# Patient Record
Sex: Male | Born: 1950
Health system: Southern US, Community
[De-identification: ages and names within clinical notes are randomized; demographics above are authoritative.]

## PROBLEM LIST (undated history)

## (undated) DIAGNOSIS — J45909 Unspecified asthma, uncomplicated: Secondary | ICD-10-CM

## (undated) DIAGNOSIS — N200 Calculus of kidney: Secondary | ICD-10-CM

## (undated) DIAGNOSIS — I10 Essential (primary) hypertension: Secondary | ICD-10-CM

## (undated) DIAGNOSIS — N400 Enlarged prostate without lower urinary tract symptoms: Secondary | ICD-10-CM

## (undated) DIAGNOSIS — K219 Gastro-esophageal reflux disease without esophagitis: Secondary | ICD-10-CM

## (undated) DIAGNOSIS — B009 Herpesviral infection, unspecified: Secondary | ICD-10-CM

## (undated) DIAGNOSIS — M1990 Unspecified osteoarthritis, unspecified site: Secondary | ICD-10-CM

## (undated) DIAGNOSIS — E785 Hyperlipidemia, unspecified: Secondary | ICD-10-CM

## (undated) HISTORY — DX: Benign prostatic hyperplasia without lower urinary tract symptoms: N40.0

## (undated) HISTORY — DX: Herpesviral infection, unspecified: B00.9

## (undated) HISTORY — DX: Calculus of kidney: N20.0

## (undated) HISTORY — DX: Unspecified asthma, uncomplicated: J45.909

## (undated) HISTORY — PX: TONSILLECTOMY: SUR1361

## (undated) HISTORY — PX: APPENDECTOMY: SHX54

## (undated) HISTORY — PX: LITHOTRIPSY: SUR834

## (undated) HISTORY — DX: Essential (primary) hypertension: I10

## (undated) HISTORY — DX: Gastro-esophageal reflux disease without esophagitis: K21.9

## (undated) HISTORY — PX: BREAST CYST EXCISION: SHX579

## (undated) HISTORY — DX: Hyperlipidemia, unspecified: E78.5

## (undated) HISTORY — DX: Unspecified osteoarthritis, unspecified site: M19.90

---

## 2016-06-29 ENCOUNTER — Encounter: Payer: Self-pay | Admitting: Internal Medicine

## 2016-06-29 ENCOUNTER — Ambulatory Visit (INDEPENDENT_AMBULATORY_CARE_PROVIDER_SITE_OTHER): Payer: 59 | Admitting: Internal Medicine

## 2016-06-29 VITALS — BP 130/82 | HR 85 | Ht 69.0 in | Wt 197.8 lb

## 2016-06-29 DIAGNOSIS — J4530 Mild persistent asthma, uncomplicated: Secondary | ICD-10-CM | POA: Diagnosis not present

## 2016-06-29 DIAGNOSIS — R058 Other specified cough: Secondary | ICD-10-CM | POA: Insufficient documentation

## 2016-06-29 DIAGNOSIS — R05 Cough: Secondary | ICD-10-CM | POA: Diagnosis not present

## 2016-06-29 MED ORDER — PREDNISONE 10 MG PO TABS: ORAL_TABLET | ORAL | 0 refills | Status: DC

## 2016-06-29 MED ORDER — BUDESONIDE-FORMOTEROL FUMARATE 80-4.5 MCG/ACT IN AERO: 2.0000 | INHALATION_SPRAY | Freq: Two times a day (BID) | RESPIRATORY_TRACT | 12 refills | Status: DC

## 2016-06-29 MED ORDER — AMOXICILLIN-POT CLAVULANATE 875-125 MG PO TABS: 1.0000 | ORAL_TABLET | Freq: Two times a day (BID) | ORAL | 0 refills | Status: AC

## 2016-06-29 NOTE — Patient Instructions (Addendum)
Augmentin 875 mg take one pill twice daily  X 10 days - take at breakfast and supper with large glass of water.  It would help reduce the usual side effects (diarrhea and yeast infections) if you ate cultured yogurt at lunch.   Please see patient coordinator before you leave today  to schedule CT sinus  in 10 days - no sooner  Use nasal saline as much as possible to help moisturize your passages   Continue protonix but take it Take 30-60 min before first meal of the day   Prednisone 10 mg take  4 each am x 2 days,   2 each am x 2 days,  1 each am x 2 days and stop   Work on inhaler technique:  relax and gently blow all the way out then take a nice smooth deep breath back in, triggering the inhaler at same time you start breathing in.  Hold for up to 5 seconds if you can. Blow out thru nose. Rinse and gargle with water when done     Plan A = Automatic = symbicort 80 Take 2 puffs first thing in am and then another 2 puffs about 12 hours later.      Plan B = Backup Only use your albuterol as a rescue medication to be used if you can't catch your breath by resting or doing a relaxed purse lip breathing pattern.  - The less you use it, the better it will work when you need it. - Ok to use the inhaler up to 2 puffs  every 4 hours if you must but call for appointment if use goes up over your usual need - Don't leave home without it !!  (think of it like the spare tire for your car)   Please schedule a follow up office visit in 2 weeks, sooner if needed

## 2016-06-29 NOTE — Progress Notes (Signed)
Subjective:     Patient ID: Joshua [name], male   DOB: 03/21/1951,    MRN: [id number]  HPI   66 yo Joshua [name] with bad bronchitis around 1993 and quit smoking and then  all better until around 2000 while living in [place] with intermittent "wheezing" dx as asthma > never resolved  Self referred for dtc asthma   06/29/2016 1st [facility] office visit/ Yurika Pereda   Chief Complaint  Patient presents with  . Pulmonary Consult    moved from [place], chronic bronchitis, wheezing, dx with asthma in 2000, cold humidity makes it worse  maint on symb 160 2bid and at baseline intermittently noisy breathing s pattern though not waking him  noct   Worse since  Dec 26 th with uri/ sinus symptoms >  [facility] rx  Amox/only a little better, still severe nasal congestion/severe fits of day > noct cough and sub wheeze.  Typical triggers are uri/not seasonal pattern rhintiis - happens up to 4 x yearly with or without symbicort and just as severe.  No obvious day to day or daytime variability or assocmucus plugs or hemoptysis or cp or chest tightness,   or overt  hb symptoms. No unusual exp hx or h/o childhood pna/ asthma or knowledge of premature birth.  Sleeping ok without nocturnal  or early am exacerbation  of respiratory  c/o's or need for noct saba. Also denies any obvious fluctuation of symptoms with weather or environmental changes or other aggravating or alleviating factors except as outlined above   Current Medications, Allergies, Complete Past Medical History, Past Surgical History, Family History, and Social History were reviewed in [employer] record.  ROS  The following are not active complaints unless bolded sore throat, dysphagia, dental problems, itching, sneezing,  nasal congestion or excess/ purulent secretions, ear ache,   fever, chills, sweats, unintended wt loss, classically pleuritic or exertional cp,  orthopnea pnd or leg swelling, presyncope, palpitations, abdominal pain,  anorexia, nausea, vomiting, diarrhea  or change in bowel or bladder habits, change in stools or urine, dysuria,hematuria,  rash, arthralgias, visual complaints, headache, numbness, weakness or ataxia or problems with walking or coordination,  change in mood/affect or memory.             Review of Systems     Objective:   Physical Exam  amb wf   Wt Readings from Last 3 Encounters:  06/29/16 197 lb 12.8 oz (89.7 kg)    Vital signs reviewed - Note on arrival 02 sats  98% on RA     HEENT: nl dentition,   and oropharynx. Nl external ear canals without cough reflex  - MP secretions bilaterally with severe turbinate edema    NECK :  without JVD/Nodes/TM/ nl carotid upstrokes bilaterally   LUNGS: no acc muscle use,  Nl contour chest with prominent upper airway wheezing only    CV:  RRR  no s3 or murmur or increase in P2, nad no edema   ABD:  soft and nontender with nl inspiratory excursion in the supine position. No bruits or organomegaly appreciated, bowel sounds nl  MS:  Nl gait/ ext warm without deformities, calf tenderness, cyanosis or clubbing No obvious joint restrictions   SKIN: warm and dry without lesions    NEURO:  alert, approp, nl sensorium with  no motor or cerebellar deficits apparent.   cxr ordered 06/29/2016 not done     Assessment:

## 2016-06-30 DIAGNOSIS — J4530 Mild persistent asthma, uncomplicated: Secondary | ICD-10-CM | POA: Insufficient documentation

## 2016-06-30 NOTE — Assessment & Plan Note (Signed)
06/29/2016  After extensive coaching HFA effectiveness =    75% from baseline 10% > reduce symbicort to 80 2 bid   Not really clear how much asthma is present since he says he's having a typical flare now and only getting 10% of his hfa to the target with no change in the pattern of his symptoms before or after maint rx with symbicort so for now rec reduce to symb 80 2bid then do full pfts before stopping it altogether if possible

## 2016-06-30 NOTE — Assessment & Plan Note (Signed)
Upper airway cough syndrome (previously labeled PNDS) , is  so named because it's frequently impossible to sort out how much is  CR/sinusitis with freq throat clearing (which can be related to primary GERD)   vs  causing  secondary (" extra esophageal")  GERD from wide swings in gastric pressure that occur with throat clearing, often  promoting self use of mint and menthol lozenges that reduce the lower esophageal sphincter tone and exacerbate the problem further in a cyclical fashion.   These are the same pts (now being labeled as having "irritable larynx syndrome" by some cough centers) who not infrequently have a history of having failed to tolerate ace inhibitors,  dry powder inhalers or biphosphonates or report having atypical/extraesophageal reflux symptoms that don't respond to standard doses of PPI  and are easily confused as having aecopd or asthma flares by even experienced allergists/ pulmonologists (myself included).   I don't really think he has much asthma at this point and to test this theory rec reduce symb to 80 2bid (see separate a/p) and focus on rx of upper airway sources:  Treat sinusitis and possible gerd aggressively then regroup here in 2 weeks with sinus ct p rx to see if ent eval needed  Total time devoted to counseling  > 50 % of 60 min office visit:  review case with pt/ discussion of options/alternatives/ personally creating written customized instructions  in presence of pt  then going over those specific  Instructions directly with the pt including how to use all of the meds but in particular covering each new medication in detail and the difference between the maintenance/automatic meds and the prns using an action plan format for the latter.  Please see AVS from this visit for a full list of these instructions which I personally wrote for him and  are unique to this visit.

## 2016-07-09 ENCOUNTER — Ambulatory Visit (HOSPITAL_BASED_OUTPATIENT_CLINIC_OR_DEPARTMENT_OTHER)
Admission: RE | Admit: 2016-07-09 | Discharge: 2016-07-09 | Disposition: A | Discharge status: Home / Self Care | Payer: BLUE CROSS/BLUE SHIELD | Source: Ambulatory Visit | Attending: Internal Medicine | Admitting: Internal Medicine

## 2016-07-09 DIAGNOSIS — R05 Cough: Secondary | ICD-10-CM | POA: Diagnosis not present

## 2016-07-09 DIAGNOSIS — R058 Other specified cough: Secondary | ICD-10-CM

## 2016-07-09 NOTE — Progress Notes (Signed)
LMTCB

## 2016-07-12 NOTE — Progress Notes (Signed)
Called and left detailed msg on spouses phone (ok per DPR)

## 2016-07-21 ENCOUNTER — Ambulatory Visit (INDEPENDENT_AMBULATORY_CARE_PROVIDER_SITE_OTHER): Payer: 59 | Admitting: Internal Medicine

## 2016-07-21 ENCOUNTER — Encounter: Payer: Self-pay | Admitting: Internal Medicine

## 2016-07-21 ENCOUNTER — Ambulatory Visit (INDEPENDENT_AMBULATORY_CARE_PROVIDER_SITE_OTHER)
Admission: RE | Admit: 2016-07-21 | Discharge: 2016-07-21 | Disposition: A | Discharge status: Home / Self Care | Payer: 59 | Source: Ambulatory Visit | Attending: Internal Medicine | Admitting: Internal Medicine

## 2016-07-21 VITALS — BP 120/80 | HR 86 | Ht 69.0 in | Wt 196.0 lb

## 2016-07-21 DIAGNOSIS — R05 Cough: Secondary | ICD-10-CM

## 2016-07-21 DIAGNOSIS — R058 Other specified cough: Secondary | ICD-10-CM

## 2016-07-21 DIAGNOSIS — J4530 Mild persistent asthma, uncomplicated: Secondary | ICD-10-CM

## 2016-07-21 LAB — NITRIC OXIDE: NITRIC OXIDE: 24

## 2016-07-21 NOTE — Assessment & Plan Note (Signed)
06/29/2016  After extensive coaching HFA effectiveness =    75% from baseline 10% > reduce symbicort to 80 2 bid  - FENO 07/21/2016  =   24 on symb 80 2bid - Spirometry 07/21/2016  wnl including  Mid flows p am symbicort > try off   He is better on lower dose of symbicort so this is either not asthma or he has well controlled asthma with unrelated non-specific UACS which was worse with higher dose of ICS   rec try off symbicort completely while maint rx for gerd and focusing on rx of chronic rhinitis/ sinusitis  I had an extended discussion with the patient and wife  reviewing all relevant studies completed to date and  lasting 15 to 20 minutes of a 25 minute visit    Each maintenance medication was reviewed in detail including most importantly the difference between maintenance and prns and under what circumstances the prns are to be triggered using an action plan format that is not reflected in the computer generated alphabetically organized AVS.    Please see AVS for specific instructions unique to this visit that I personally wrote and verbalized to the the pt in detail and then reviewed with pt  by my nurse highlighting any  changes in therapy recommended at today's visit to their plan of care.

## 2016-07-21 NOTE — Progress Notes (Signed)
Subjective:     Patient ID: Joshua [name], male   DOB: October 12, 1950,    MRN: [id number]    Brief patient profile:  66 yo [name] with bad bronchitis around 1993 and quit smoking and then  all better until around 2000 while living in [place] with intermittent "wheezing" dx as asthma > never resolved  Self referred for dtc asthma   History of Present Illness  06/29/2016 1st [facility] office visit/ Joshua [name]   Chief Complaint  Patient presents with  . Pulmonary Consult    moved from [place], chronic bronchitis, wheezing, dx with asthma in 2000, cold humidity makes it worse  maint on symb 160 2bid and at baseline intermittently noisy breathing one breath q 2-3 days  s pattern though not waking him  noct   Worse since  Jun 22 2016  with uri/ sinus symptoms >  [facility] rx  Amox/only a little better, still severe nasal congestion/severe fits of day > noct cough and subj wheeze.  Typical triggers are uri/not seasonal pattern rhintiis - happens up to 4 x yearly with or without symbicort and just as severe. rec Augmentin 875 mg take one pill twice daily  X 10 days   Please see patient coordinator before you leave today  to schedule CT sinus  in 10 days - no sooner Use nasal saline as much as possible to help moisturize your passages  Continue protonix but take it Take 30-60 min before first meal of the day  Prednisone 10 mg take  4 each am x 2 days,   2 each am x 2 days,  1 each am x 2 days and stop  Work on inhaler technique:  Plan A = Automatic = symbicort 80 Take 2 puffs first thing in am and then another 2 puffs about 12 hours later.  Plan B = Backup Only use your albuterol (Proir) as a rescue medication to be used if you can't catch your breath by resting or doing a relaxed purse lip breathing pattern.  - The less you use it, the better it will work when you need it. - Ok to use the inhaler up to 2 puffs  every 4 hours     07/21/2016  f/u ov/Joshua [name] re: uacs vs asthma Chief Complaint  Patient presents  with  . Follow-up    Cough is much improved, but has not completely resolved. Breathing has improved.   cough is worse at bedtime and in am p rising but not waking up prematurely and not productive  Bloody nasal d/c / no sob at all   No obvious day to day or daytime variability or assoc mucus plugs or hemoptysis or cp or chest tightness,   or overt  hb symptoms. No unusual exp hx or h/o childhood pna/ asthma or knowledge of premature birth.  Sleeping ok without nocturnal  or early am exacerbation  of respiratory  c/o's or need for noct saba. Also denies any obvious fluctuation of symptoms with weather or environmental changes or other aggravating or alleviating factors except as outlined above   Current Medications, Allergies, Complete Past Medical History, Past Surgical History, Family History, and Social History were reviewed in [employer] record.  ROS  The following are not active complaints unless bolded sore throat, dysphagia, dental problems, itching, sneezing,  nasal congestion   ear ache,   fever, chills, sweats, unintended wt loss, classically pleuritic or exertional cp,  orthopnea pnd or leg swelling, presyncope, palpitations, abdominal pain,  anorexia, nausea, vomiting, diarrhea  or change in bowel or bladder habits, change in stools or urine, dysuria,hematuria,  rash, arthralgias, visual complaints, headache, numbness, weakness or ataxia or problems with walking or coordination,  change in mood/affect or memory.           Objective:   Physical Exam  amb wm very nasal sounding voince     Wt Readings from Last 3 Encounters:  07/21/16 196 lb (88.9 kg)  06/29/16 197 lb 12.8 oz (89.7 kg)    Vital signs reviewed   - Note on arrival 02 sats  96% on RA    HEENT: nl dentition,   and oropharynx. Nl external ear canals without cough reflex  - crusty secretions bilaterally with severe turbinate edema R > L     NECK :  without JVD/Nodes/TM/ nl carotid upstrokes  bilaterally   LUNGS: no acc muscle use,  Nl contour chest  - clear to A and P bilaterally without cough on inspiration or exp maneuvers    CV:  RRR  no s3 or murmur or increase in P2, nad no edema   ABD:  soft and nontender with nl inspiratory excursion in the supine position. No bruits or organomegaly appreciated, bowel sounds nl  MS:  Nl gait/ ext warm without deformities, calf tenderness, cyanosis or clubbing No obvious joint restrictions   SKIN: warm and dry without lesions    NEURO:  alert, approp, nl sensorium with  no motor or cerebellar deficits apparent.      CXR PA and Lateral:   07/21/2016 :    I personally reviewed images and agree with radiology impression as follows:    no acute cardiopulmonary findings      Assessment:

## 2016-07-21 NOTE — Patient Instructions (Signed)
Use lots of saline nasal spray on your nose  Please remember to go to the x-ray department downstairs for your tests - we will call you with the results when they are available.   Try off the symbicort to see if any worse and if so restart  At 2 pffs every 12 hour  We will call to schedule ENT    Please schedule a follow up office visit in 6 weeks, call sooner if needed

## 2016-07-21 NOTE — Assessment & Plan Note (Addendum)
06/29/2016  After extensive coaching HFA effectiveness =    75% try lower strength of symb - sinus CT p 10 day augmentin  06/29/2016 >>>  CT  Sinus 07/09/2016 mild chronic changes only  - cough better 07/21/2016 > try just on gerd rx/ no more symbicort   Reports neg allergy w/u for same problem in [place] last year so unlikely to be fruitful now but all his problems at this point relate to poorly controlled rhinitis so plan to refer to ENT next

## 2016-07-22 ENCOUNTER — Other Ambulatory Visit: Payer: Self-pay | Admitting: Internal Medicine

## 2016-07-22 DIAGNOSIS — J328 Other chronic sinusitis: Secondary | ICD-10-CM

## 2016-07-22 DIAGNOSIS — J329 Chronic sinusitis, unspecified: Secondary | ICD-10-CM | POA: Insufficient documentation

## 2016-07-22 NOTE — Progress Notes (Signed)
Spoke with pt and notified of results per Dr. Wert. Pt verbalized understanding and denied any questions. 

## 2016-08-04 ENCOUNTER — Other Ambulatory Visit: Payer: Self-pay | Admitting: Internal Medicine

## 2016-08-04 MED ORDER — BUDESONIDE-FORMOTEROL FUMARATE 80-4.5 MCG/ACT IN AERO: 2.0000 | INHALATION_SPRAY | Freq: Two times a day (BID) | RESPIRATORY_TRACT | 11 refills | Status: DC

## 2016-08-04 MED ORDER — PANTOPRAZOLE SODIUM 40 MG PO TBEC: 40.0000 mg | DELAYED_RELEASE_TABLET | Freq: Every day | ORAL | 11 refills | Status: DC

## 2016-08-04 NOTE — Telephone Encounter (Signed)
Pt returning call.Joshua [name] ° °

## 2016-08-04 NOTE — Telephone Encounter (Signed)
lmtcb to verify pharmacy, before ordering Rx.

## 2016-08-04 NOTE — Telephone Encounter (Signed)
Rxs have been sent to [facility] in [place]. I have left a message with the pt making him aware of this and let us know if this was the incorrect pharmacy. Nothing further was needed.

## 2016-08-18 ENCOUNTER — Ambulatory Visit: Payer: 59 | Admitting: Family Medicine

## 2016-09-03 ENCOUNTER — Ambulatory Visit: Payer: 59 | Admitting: Internal Medicine

## 2016-09-23 ENCOUNTER — Encounter: Payer: Self-pay | Admitting: Internal Medicine

## 2016-09-23 ENCOUNTER — Ambulatory Visit (INDEPENDENT_AMBULATORY_CARE_PROVIDER_SITE_OTHER): Payer: 59 | Admitting: Internal Medicine

## 2016-09-23 VITALS — BP 148/90 | HR 80 | Ht 69.0 in | Wt 196.4 lb

## 2016-09-23 DIAGNOSIS — J328 Other chronic sinusitis: Secondary | ICD-10-CM

## 2016-09-23 DIAGNOSIS — R05 Cough: Secondary | ICD-10-CM | POA: Diagnosis not present

## 2016-09-23 DIAGNOSIS — R058 Other specified cough: Secondary | ICD-10-CM

## 2016-09-23 DIAGNOSIS — J4530 Mild persistent asthma, uncomplicated: Secondary | ICD-10-CM

## 2016-09-23 NOTE — Patient Instructions (Signed)
Plan A = Automatic = symbicort 80 Take 2 puffs first thing in am and then another 2 puffs about 12 hours later.   Work on inhaler technique:  relax and gently blow all the way out then take a nice smooth deep breath back in, triggering the inhaler at same time you start breathing in.  Hold for up to 5 seconds if you can. Blow out thru nose. Rinse and gargle with water when done      Plan B = Backup Only use your albuterol as a rescue medication to be used if you can't catch your breath by resting or doing a relaxed purse lip breathing pattern.  - The less you use it, the better it will work when you need it. - Ok to use the inhaler up to 2 puffs  every 4 hours if you must but call for appointment if use goes up over your usual need - Don't leave home without it !!  (think of it like the spare tire for your car)     If you are satisfied with your treatment plan,  let your doctor know and he/she can either refill your medications or you can return here when your prescription runs out.     If in any way you are not 100% satisfied,  please tell us.  If 100% better, tell your friends!  Pulmonary follow up is as needed

## 2016-09-23 NOTE — Progress Notes (Signed)
Subjective:     Patient ID: Joshua [name], male   DOB: September 29, 1950,    MRN: [id number]    Brief patient profile:  66 yo Joshua [name] with bad bronchitis around 1993 and quit smoking and then  all better until around 2000 while living in [place] with intermittent "wheezing" dx as asthma > never resolved  Self referred for dtc asthma   History of Present Illness  06/29/2016 1st Joshua [name] office visit/ Joshua [name]   Chief Complaint  Patient presents with  . [name] Consult    moved from [place], chronic bronchitis, wheezing, dx with asthma in 2000, cold humidity makes it worse  maint on symb 160 2bid and at baseline intermittently noisy breathing one breath q 2-3 days  s pattern though not waking him  noct   Worse since  Jun 22 2016  with uri/ sinus symptoms >  [facility] rx  Amox/only a little better, still severe nasal congestion/severe fits of day > noct cough and subj wheeze.  Typical triggers are uri/not seasonal pattern rhintiis - happens up to 4 x yearly with or without symbicort and just as severe. rec Augmentin 875 mg take one pill twice daily  X 10 days   Please see patient coordinator before you leave today  to schedule CT sinus  in 10 days - no sooner Use nasal saline as much as possible to help moisturize your passages  Continue protonix but take it Take 30-60 min before first meal of the day  Prednisone 10 mg take  4 each am x 2 days,   2 each am x 2 days,  1 each am x 2 days and stop  Work on inhaler technique:  Plan A = Automatic = symbicort 80 Take 2 puffs first thing in am and then another 2 puffs about 12 hours later.  Plan B = Backup Only use your albuterol (Proir) as a rescue medication to be used if you can't catch your breath by resting or doing a relaxed purse lip breathing pattern.  - The less you use it, the better it will work when you need it. - Ok to use the inhaler up to 2 puffs  every 4 hours     07/21/2016  f/u ov/Joshua [name] re: uacs vs asthma Chief Complaint  Patient presents  with  . Follow-up    Cough is much improved, but has not completely resolved. Breathing has improved.   cough is worse at bedtime and in am p rising but not waking up prematurely and not productive  Bloody nasal d/c / no sob at all  rec Use lots of saline nasal spray on your nose Please remember to go to the x-ray department downstairs for your tests - we will call you with the results when they are available. Ty off the symbicort to see if any worse and if so restart  At 2 pffs every 12 hour schedule ENT  >   [name] rec abx and f/u prn    09/23/2016  f/u ov/Joshua [name] re: uacs vs asthma, was worse off symbicort so restarted and improved  Chief Complaint  Patient presents with  . Follow-up    Cough has completely resovled. He states he is using his albuterol inhaler 1 x every 2 wks on average.    Not limited by breathing from desired activities    No obvious day to day or daytime variability or assoc excess/ purulent sputum or mucus plugs or hemoptysis or cp or chest tightness, subjective wheeze or  overt sinus or hb symptoms. No unusual exp hx or h/o childhood pna/ asthma or knowledge of premature birth.  Sleeping ok without nocturnal  or early am exacerbation  of respiratory  c/o's or need for noct saba. Also denies any obvious fluctuation of symptoms with weather or environmental changes or other aggravating or alleviating factors except as outlined above   Current Medications, Allergies, Complete Past Medical History, Past Surgical History, Family History, and Social History were reviewed in [employer] record.  ROS  The following are not active complaints unless bolded sore throat, dysphagia, dental problems, itching, sneezing,  nasal congestion or excess/ purulent secretions, ear ache,   fever, chills, sweats, unintended wt loss, classically pleuritic or exertional cp,  orthopnea pnd or leg swelling, presyncope, palpitations, abdominal pain, anorexia, nausea,  vomiting, diarrhea  or change in bowel or bladder habits, change in stools or urine, dysuria,hematuria,  rash, arthralgias, visual complaints, headache, numbness, weakness or ataxia or problems with walking or coordination,  change in mood/affect or memory.           Objective:   Physical Exam  amb wm nad     Wt Readings from Last 3 Encounters:  07/21/16 196 lb (88.9 kg)  06/29/16 197 lb 12.8 oz (89.7 kg)    Vital signs reviewed     HEENT: nl dentition,   and oropharynx. Nl external ear canals without cough reflex  - moderate bilateral non-specific turbinate edema    NECK :  without JVD/Nodes/TM/ nl carotid upstrokes bilaterally   LUNGS: no acc muscle use,  Nl contour chest  - clear to A and P bilaterally without cough on inspiration or exp maneuvers    CV:  RRR  no s3 or murmur or increase in P2, nad no edema   ABD:  soft and nontender with nl inspiratory excursion in the supine position. No bruits or organomegaly appreciated, bowel sounds nl  MS:  Nl gait/ ext warm without deformities, calf tenderness, cyanosis or clubbing No obvious joint restrictions   SKIN: warm and dry without lesions    NEURO:  alert, approp, nl sensorium with  no motor or cerebellar deficits apparent.      CXR PA and Lateral:   07/21/2016 :    I personally reviewed images and agree with radiology impression as follows:    no acute cardiopulmonary findings      Assessment:       Outpatient Encounter Prescriptions as of 09/23/2016  Medication Sig  . Albuterol Sulfate 108 (90 Base) MCG/ACT AEPB Inhale into the lungs.  Marland Kitchen alfuzosin (UROXATRAL) 10 MG 24 hr tablet Take 1 tablet by mouth daily.  . budesonide-formoterol (SYMBICORT) 80-4.5 MCG/ACT inhaler Inhale 2 puffs into the lungs 2 (two) times daily.  Marland Kitchen losartan (COZAAR) 100 MG tablet Take 100 mg by mouth daily.  . pantoprazole (PROTONIX) 40 MG tablet Take 1 tablet (40 mg total) by mouth daily.   No facility-administered encounter medications on  file as of 09/23/2016.

## 2016-09-24 NOTE — Assessment & Plan Note (Signed)
06/29/2016  After extensive coaching HFA effectiveness =    75% try lower strength of symb - sinus CT p 10 day augmentin  06/29/2016 >>>  CT  Sinus 07/09/2016 mild chronic changes only  - cough better 07/21/2016 > try just on gerd rx/ no more symbicort > flared off symbicort supporting asthma   For now would like him continue on gerd rx x 3 months total and then try off and see if cough flares in which case GI eval needed

## 2016-09-24 NOTE — Assessment & Plan Note (Signed)
eval by [facility] ent 07/26/16  Nordbladh, PA > rec levaquin  500 x  10 days and f/u prn

## 2016-09-24 NOTE — Assessment & Plan Note (Signed)
06/29/2016    reduce symbicort to 80 2 bid  - FENO 07/21/2016  =   24 on symb 80 2bid - Spirometry 07/21/2016  wnl including  Mid flows p am symbicort > try off > flared so restarted  - 09/23/2016  After extensive coaching HFA effectiveness =    90%   Flare off symbicort 80 resolved back on it is good evidence of asthma/ cough variant and now it appears All goals of chronic asthma control met including optimal function and elimination of symptoms with minimal need for rescue therapy.  I had an extended final summary discussion with the patient reviewing all relevant studies completed to date and  lasting 15 to 20 minutes of a 25 minute visit on the following issues:   Contingencies discussed in full including contacting this office immediately if not controlling the symptoms using the rule of two's.     Each maintenance medication was reviewed in detail including most importantly the difference between maintenance and as needed and under what circumstances the prns are to be used.  Please see AVS for specific  Instructions which are unique to this visit and I personally typed out  which were reviewed in detail in writing with the patient and a copy provided.    f/u can be per primary care > f/u here prn

## 2016-09-30 ENCOUNTER — Encounter: Payer: Self-pay | Admitting: Family Medicine

## 2016-09-30 ENCOUNTER — Ambulatory Visit (INDEPENDENT_AMBULATORY_CARE_PROVIDER_SITE_OTHER): Payer: 59 | Admitting: Family Medicine

## 2016-09-30 VITALS — BP 150/88 | HR 75 | Temp 98.2°F | Ht 69.0 in | Wt 197.0 lb

## 2016-09-30 DIAGNOSIS — Z13220 Encounter for screening for lipoid disorders: Secondary | ICD-10-CM

## 2016-09-30 DIAGNOSIS — Z131 Encounter for screening for diabetes mellitus: Secondary | ICD-10-CM

## 2016-09-30 DIAGNOSIS — I10 Essential (primary) hypertension: Secondary | ICD-10-CM | POA: Diagnosis not present

## 2016-09-30 DIAGNOSIS — Z130 Encounter for screening for diseases of the blood and blood-forming organs and certain disorders involving the immune mechanism: Secondary | ICD-10-CM | POA: Diagnosis not present

## 2016-09-30 DIAGNOSIS — K219 Gastro-esophageal reflux disease without esophagitis: Secondary | ICD-10-CM | POA: Diagnosis not present

## 2016-09-30 MED ORDER — AMLODIPINE BESYLATE 5 MG PO TABS: 5.0000 mg | ORAL_TABLET | Freq: Every day | ORAL | 3 refills | Status: DC

## 2016-09-30 NOTE — Progress Notes (Signed)
[facility] [address] [place], [place] [address] [phone]  Date:  09/30/2016   Name:  Joshua [name]   DOB:  September 03, 1950   MRN:  [id number]  PCP:  Lamar Blinks, MD    Chief Complaint: Establish Care (Pt here to est care. )   History of Present Illness:  Joshua [name] is a 66 y.o. very pleasant male patient who presents with the following:  Here today as a new patient; he recently moved here from [place].  He is a pt of Dr. Melvyn Novas for his lung issues.  He was dx with mild asthma back in [place]- however Dr. Melvyn Novas is not convinced that he has asthma.  He is trying to wean off his symbicort as they are not sure if it is really helping him anyway.  He notes that he will tend to have "a deep sigh." and he will kind of loose his breath.  He will use his albteurol as needed for rescue- however he does not need it that often.  He does use it for his cough- often triggered by cold air  He also has GERD and HTN  He is taking losartan but we have noted his BP running a bit high lately He does check his BP at home- his home numbers may be 135/90, 150/95.   He would be willing to add another medication, but does not want a diuretic due to his BPH  He does not have any history of cardiac disease - he did do a stress test 2 or 3 years ago and it was normal   He works from home as a Engineer, maintenance (IT)- he works in [employer] for a [employer], [employer].   He speaks [place], [place].    BP Readings from Last 3 Encounters:  09/30/16 (!) 150/88  09/23/16 (!) 148/90  07/21/16 120/80   No recent labs- he last ate a couple of hours ago, he had some stuffed grape leaves He has HSV that will attack on his behind at times- he uses valtrx as needed for this. He hs plenty of this and does not wish to be on daily suppressive therapy   He also takes medication for BPH- uses daily.  This is per his urologist- he sees his doctor in [place] still when he  visits  Patient Active Problem List   Diagnosis Date Noted  . Sinusitis, chronic/Cough 07/22/2016  . Chronic asthma, mild persistent, uncomplicated 32/35/5732  . Upper airway cough syndrome 06/29/2016    Past Medical History:  Diagnosis Date  . BPH (benign prostatic hyperplasia)   . GERD (gastroesophageal reflux disease)   . HSV infection   . Hypertension     Past Surgical History:  Procedure Laterality Date  . APPENDECTOMY    . CYSTECTOMY Left 2002   on left breast  . TONSILLECTOMY      Social History  Substance Use Topics  . Smoking status: Former Smoker    Packs/day: 0.50    Years: 22.00    Types: Cigarettes    Quit date: 06/28/1993  . Smokeless tobacco: Never Used  . Alcohol use Yes     Comment: occasionally    Family History  Problem Relation Age of Onset  . Alzheimer's disease Father     No Known Allergies  Medication list has been reviewed and updated.  Current Outpatient Prescriptions on File Prior to Visit  Medication Sig Dispense Refill  . Albuterol Sulfate  108 (90 Base) MCG/ACT AEPB Inhale into the lungs.    Marland Kitchen alfuzosin (UROXATRAL) 10 MG 24 hr tablet Take 1 tablet by mouth daily.    . budesonide-formoterol (SYMBICORT) 80-4.5 MCG/ACT inhaler Inhale 2 puffs into the lungs 2 (two) times daily. 1 Inhaler 11  . losartan (COZAAR) 100 MG tablet Take 100 mg by mouth daily.    . pantoprazole (PROTONIX) 40 MG tablet Take 1 tablet (40 mg total) by mouth daily. 30 tablet 11   No current facility-administered medications on file prior to visit.     Review of Systems:  As per HPI- otherwise negative.  No CP, SOB, fever or chills    Physical Examination: Vitals:   09/30/16 1634 09/30/16 1637  BP: (!) 150/90 (!) 150/88  Pulse: 75   Temp: 98.2 F (36.8 C)    Vitals:   09/30/16 1634  Weight: 197 lb (89.4 kg)  Height: 5\' 9"  (1.753 m)   Body mass index is 29.09 kg/m. Ideal Body Weight: Weight in (lb) to have BMI = 25: 168.9  GEN: WDWN, NAD,  Non-toxic, A & O x 3, looks well, mild overweight HEENT: Atraumatic, Normocephalic. Neck supple. No masses, No LAD. Ears and Nose: No external deformity. CV: RRR, No M/G/R. No JVD. No thrill. No extra heart sounds. PULM: CTA B, no wheezes, crackles, rhonchi. No retractions. No resp. distress. No accessory muscle use. ABD: S, NT, ND, +BS. No rebound. No HSM. EXTR: No c/c/e NEURO Normal gait.  PSYCH: Normally interactive. Conversant. Not depressed or anxious appearing.  Calm demeanor.    Assessment and Plan: Essential hypertension - Plan: amLODipine (NORVASC) 5 MG tablet  Gastroesophageal reflux disease, esophagitis presence not specified  Screening for hyperlipidemia - Plan: Lipid panel  Screening for diabetes mellitus - Plan: Comprehensive metabolic panel, Hemoglobin A1c  Screening for deficiency anemia - Plan: CBC  Asked him to sign a records release for [place] to get his records from [place] and he plans to do so He will come in for fasting labs tomorrow Will add 2.5 mg of amlodipine to his current regimen for his BP- he will let me know if his BP is still higher than 135/90- in that case increase to a whole 5 mg tab Will plan further follow- up pending labs.   Signed Lamar Blinks, MD

## 2016-09-30 NOTE — Patient Instructions (Signed)
It was very nice to see you today- please come in tomorrow for fasting labs in the morning.   For your blood pressure, we will try adding a small dose of amlodipine.  I gave you the 5 mg dose, but let's have you try taking a 1/2 pill first.  Please add this to your losartan and monitor your blood pressure at home - please let me know how it looks

## 2016-10-01 ENCOUNTER — Encounter: Payer: Self-pay | Admitting: Family Medicine

## 2016-10-01 ENCOUNTER — Other Ambulatory Visit (INDEPENDENT_AMBULATORY_CARE_PROVIDER_SITE_OTHER): Payer: 59

## 2016-10-01 DIAGNOSIS — Z130 Encounter for screening for diseases of the blood and blood-forming organs and certain disorders involving the immune mechanism: Secondary | ICD-10-CM

## 2016-10-01 DIAGNOSIS — Z131 Encounter for screening for diabetes mellitus: Secondary | ICD-10-CM | POA: Diagnosis not present

## 2016-10-01 DIAGNOSIS — Z13220 Encounter for screening for lipoid disorders: Secondary | ICD-10-CM | POA: Diagnosis not present

## 2016-10-01 LAB — COMPREHENSIVE METABOLIC PANEL
ALBUMIN: 3.9 g/dL (ref 3.5–5.2)
ALT: 22 U/L (ref 0–53)
AST: 17 U/L (ref 0–37)
Alkaline Phosphatase: 73 U/L (ref 39–117)
BUN: 13 mg/dL (ref 6–23)
CHLORIDE: 105 meq/L (ref 96–112)
CO2: 29 meq/L (ref 19–32)
Calcium: 8.9 mg/dL (ref 8.4–10.5)
Creatinine, Ser: 1.06 mg/dL (ref 0.40–1.50)
GFR: 74.57 mL/min (ref >60.00)
Glucose, Bld: 98 mg/dL (ref 70–99)
POTASSIUM: 4 meq/L (ref 3.5–5.1)
SODIUM: 139 meq/L (ref 135–145)
Total Bilirubin: 0.5 mg/dL (ref 0.2–1.2)
Total Protein: 6.6 g/dL (ref 6.0–8.3)

## 2016-10-01 LAB — LIPID PANEL
CHOLESTEROL: 187 mg/dL (ref 0–200)
HDL: 52.9 mg/dL (ref >39.00)
LDL CALC: 115 mg/dL — AB (ref 0–99)
NonHDL: 134.59
Total CHOL/HDL Ratio: 4
Triglycerides: 100 mg/dL (ref 0.0–149.0)
VLDL: 20 mg/dL (ref 0.0–40.0)

## 2016-10-01 LAB — CBC
HEMATOCRIT: 44.1 % (ref 39.0–52.0)
Hemoglobin: 14.9 g/dL (ref 13.0–17.0)
MCHC: 33.7 g/dL (ref 30.0–36.0)
MCV: 80.2 fl (ref 78.0–100.0)
Platelets: 256 10*3/uL (ref 150.0–400.0)
RBC: 5.51 Mil/uL (ref 4.22–5.81)
RDW: 13.7 % (ref 11.5–15.5)
WBC: 6 10*3/uL (ref 4.0–10.5)

## 2016-10-01 LAB — HEMOGLOBIN A1C: HEMOGLOBIN A1C: 5.5 % (ref 4.6–6.5)

## 2016-10-08 ENCOUNTER — Telehealth: Payer: Self-pay | Admitting: Family Medicine

## 2016-10-08 NOTE — Telephone Encounter (Signed)
Received his records from [place] and will abstract/ scan as needed

## 2016-10-13 ENCOUNTER — Ambulatory Visit (HOSPITAL_BASED_OUTPATIENT_CLINIC_OR_DEPARTMENT_OTHER)
Admission: RE | Admit: 2016-10-13 | Discharge: 2016-10-13 | Disposition: A | Discharge status: Home / Self Care | Payer: BLUE CROSS/BLUE SHIELD | Source: Ambulatory Visit | Attending: Medical | Admitting: Medical

## 2016-10-13 ENCOUNTER — Encounter: Payer: Self-pay | Admitting: Medical

## 2016-10-13 ENCOUNTER — Ambulatory Visit (INDEPENDENT_AMBULATORY_CARE_PROVIDER_SITE_OTHER): Payer: 59 | Admitting: Medical

## 2016-10-13 VITALS — BP 139/93 | HR 98 | Temp 98.3°F | Resp 16 | Ht 69.0 in | Wt 197.2 lb

## 2016-10-13 DIAGNOSIS — M5441 Lumbago with sciatica, right side: Secondary | ICD-10-CM | POA: Insufficient documentation

## 2016-10-13 DIAGNOSIS — M4186 Other forms of scoliosis, lumbar region: Secondary | ICD-10-CM | POA: Insufficient documentation

## 2016-10-13 MED ORDER — TRAMADOL HCL 50 MG PO TABS: 50.0000 mg | ORAL_TABLET | Freq: Three times a day (TID) | ORAL | 0 refills | Status: DC | PRN

## 2016-10-13 MED ORDER — CYCLOBENZAPRINE HCL 5 MG PO TABS: 5.0000 mg | ORAL_TABLET | Freq: Every day | ORAL | 1 refills | Status: DC

## 2016-10-13 MED ORDER — KETOROLAC TROMETHAMINE 60 MG/2ML IM SOLN
60.0000 mg | Freq: Once | INTRAMUSCULAR | Status: AC
  Administered 2016-10-13: 60 mg via INTRAMUSCULAR

## 2016-10-13 MED ORDER — HYDROCODONE-ACETAMINOPHEN 5-325 MG PO TABS: 1.0000 | ORAL_TABLET | Freq: Four times a day (QID) | ORAL | 0 refills | Status: DC | PRN

## 2016-10-13 NOTE — Patient Instructions (Addendum)
For back pain we gave toradol 60 mg im injection.  Tomorrow restart low dose ibuprofen 200-400 mg every 8 hours provided you can tolerate with no GI effects.  Use flexeril at night.  tramadol for severe pain(rx advisement)  thermacare heat patch.   Back exercises as tolerated.  If persists consider refer to specialist or get mri.  Xray of lumbar spine today  Red flag symptoms that would require ED eval discussed.  Follow up in 7-10 days or as needed   Back Exercises If you have pain in your back, do these exercises 2-3 times each day or as told by your doctor. When the pain goes away, do the exercises once each day, but repeat the steps more times for each exercise (do more repetitions). If you do not have pain in your back, do these exercises once each day or as told by your doctor. Exercises Single Knee to Chest   Do these steps 3-5 times in a row for each leg: 1. Lie on your back on a firm bed or the floor with your legs stretched out. 2. Bring one knee to your chest. 3. Hold your knee to your chest by grabbing your knee or thigh. 4. Pull on your knee until you feel a gentle stretch in your lower back. 5. Keep doing the stretch for 10-30 seconds. 6. Slowly let go of your leg and straighten it. Pelvic Tilt   Do these steps 5-10 times in a row: 1. Lie on your back on a firm bed or the floor with your legs stretched out. 2. Bend your knees so they point up to the ceiling. Your feet should be flat on the floor. 3. Tighten your lower belly (abdomen) muscles to press your lower back against the floor. This will make your tailbone point up to the ceiling instead of pointing down to your feet or the floor. 4. Stay in this position for 5-10 seconds while you gently tighten your muscles and breathe evenly. Cat-Cow   Do these steps until your lower back bends more easily: 1. Get on your hands and knees on a firm surface. Keep your hands under your shoulders, and keep your knees  under your hips. You may put padding under your knees. 2. Let your head hang down, and make your tailbone point down to the floor so your lower back is round like the back of a cat. 3. Stay in this position for 5 seconds. 4. Slowly lift your head and make your tailbone point up to the ceiling so your back hangs low (sags) like the back of a cow. 5. Stay in this position for 5 seconds. Press-Ups   Do these steps 5-10 times in a row: 1. Lie on your belly (face-down) on the floor. 2. Place your hands near your head, about shoulder-width apart. 3. While you keep your back relaxed and keep your hips on the floor, slowly straighten your arms to raise the top half of your body and lift your shoulders. Do not use your back muscles. To make yourself more comfortable, you may change where you place your hands. 4. Stay in this position for 5 seconds. 5. Slowly return to lying flat on the floor. Bridges   Do these steps 10 times in a row: 1. Lie on your back on a firm surface. 2. Bend your knees so they point up to the ceiling. Your feet should be flat on the floor. 3. Tighten your butt muscles and lift your butt off  of the floor until your waist is almost as high as your knees. If you do not feel the muscles working in your butt and the back of your thighs, slide your feet 1-2 inches farther away from your butt. 4. Stay in this position for 3-5 seconds. 5. Slowly lower your butt to the floor, and let your butt muscles relax. If this exercise is too easy, try doing it with your arms crossed over your chest. Belly Crunches   Do these steps 5-10 times in a row: 1. Lie on your back on a firm bed or the floor with your legs stretched out. 2. Bend your knees so they point up to the ceiling. Your feet should be flat on the floor. 3. Cross your arms over your chest. 4. Tip your chin a little bit toward your chest but do not bend your neck. 5. Tighten your belly muscles and slowly raise your chest just  enough to lift your shoulder blades a tiny bit off of the floor. 6. Slowly lower your chest and your head to the floor. Back Lifts  Do these steps 5-10 times in a row: 1. Lie on your belly (face-down) with your arms at your sides, and rest your forehead on the floor. 2. Tighten the muscles in your legs and your butt. 3. Slowly lift your chest off of the floor while you keep your hips on the floor. Keep the back of your head in line with the curve in your back. Look at the floor while you do this. 4. Stay in this position for 3-5 seconds. 5. Slowly lower your chest and your face to the floor. Contact a doctor if:  Your back pain gets a lot worse when you do an exercise.  Your back pain does not lessen 2 hours after you exercise. If you have any of these problems, stop doing the exercises. Do not do them again unless your doctor says it is okay. Get help right away if:  You have sudden, very bad back pain. If this happens, stop doing the exercises. Do not do them again unless your doctor says it is okay. This information is not intended to replace advice given to you by your health care provider. Make sure you discuss any questions you have with your health care provider. Document Released: 07/17/2010 Document Revised: 11/20/2015 Document Reviewed: 08/08/2014 Elsevier Interactive Patient Education  2017 [employer].

## 2016-10-13 NOTE — Progress Notes (Signed)
Subjective:    Patient ID: Joshua [name], male    DOB: [birth year], 66 y.o.   MRN: [id number]  HPI  Pt states he was playing tennins on Sunday. He states he bent over and felt the pain. Pain is not getting better. Pt states he can find comfortable position. But when has to change position pain will be severe. Pt has occasional back pain in the past. But usually if has is self limited type of pain.   Some pain that shoots toward rt side. No reports no saddle anesthesia. No leg weakness, and no incontinence.  Pt trying alleve otc but not helping. Neither does advil.     Review of Systems  Constitutional: Negative for chills, fatigue and fever.  Respiratory: Negative for cough, chest tightness, shortness of breath and wheezing.   Cardiovascular: Negative for chest pain and palpitations.  Gastrointestinal: Negative for abdominal pain.  Musculoskeletal: Positive for back pain.  Skin: Negative for rash.  Neurological: Negative for dizziness and headaches.       Some radiating pain to rt thigh.  Hematological: Negative for adenopathy. Does not bruise/bleed easily.  Psychiatric/Behavioral: Negative for behavioral problems and confusion.   Past Medical History:  Diagnosis Date  . BPH (benign prostatic hyperplasia)   . GERD (gastroesophageal reflux disease)   . HSV infection   . Hypertension      Social History   Social History  . Marital status: Married    Spouse name: N/A  . Number of children: N/A  . Years of education: N/A   Occupational History  . Not on file.   Social History Main Topics  . Smoking status: Former Smoker    Packs/day: 0.50    Years: 22.00    Types: Cigarettes    Quit date: 06/28/1993  . Smokeless tobacco: Never Used  . Alcohol use Yes     Comment: occasionally  . Drug use: No  . Sexual activity: Not on file   Other Topics Concern  . Not on file   Social History Narrative  . No narrative on file    Past Surgical History:  Procedure Laterality  Date  . APPENDECTOMY    . CYSTECTOMY Left 2002   on left breast  . TONSILLECTOMY      Family History  Problem Relation Age of Onset  . Alzheimer's disease Father     No Known Allergies  Current Outpatient Prescriptions on File Prior to Visit  Medication Sig Dispense Refill  . Albuterol Sulfate 108 (90 Base) MCG/ACT AEPB Inhale into the lungs.    Marland Kitchen alfuzosin (UROXATRAL) 10 MG 24 hr tablet Take 1 tablet by mouth daily.    Marland Kitchen amLODipine (NORVASC) 5 MG tablet Take 1 tablet (5 mg total) by mouth daily. 90 tablet 3  . budesonide-formoterol (SYMBICORT) 80-4.5 MCG/ACT inhaler Inhale 2 puffs into the lungs 2 (two) times daily. 1 Inhaler 11  . losartan (COZAAR) 100 MG tablet Take 100 mg by mouth daily.    . pantoprazole (PROTONIX) 40 MG tablet Take 1 tablet (40 mg total) by mouth daily. 30 tablet 11   No current facility-administered medications on file prior to visit.     BP (!) 139/93 (BP Location: Left Arm, Patient Position: Sitting, Cuff Size: Normal)   Pulse 98   Temp 98.3 F (36.8 C) (Oral)   Resp 16   Ht 5\' 9"  (1.753 m)   Wt 197 lb 3.2 oz (89.4 kg)   SpO2 99%   BMI 29.12 kg/m  Objective:   Physical Exam  General Appearance- Not in acute distress.    Chest and Lung Exam Auscultation: Breath sounds:-Normal. Clear even and unlabored. Adventitious sounds:- No Adventitious sounds.  Cardiovascular Auscultation:Rythm - Regular, rate and rythm. Heart Sounds -Normal heart sounds.  Abdomen Inspection:-Inspection Normal.  Palpation/Perucssion: Palpation and Percussion of the abdomen reveal- Non Tender, No Rebound tenderness, No rigidity(Guarding) and No Palpable abdominal masses.  Liver:-Normal.  Spleen:- Normal.   Back Mid lumbar spine tenderness to palpation.(rt side paralumbar) Pain on straight leg lift mild Pain on lateral movements and flexion/extension of the spine.(changing position has severe pain)  Lower ext neurologic  L5-S1 sensation intact  bilaterally. Normal patellar reflexes bilaterally. No foot drop bilaterally.      Assessment & Plan:  For back pain we gave toradol 60 mg im injection.  Tomorrow restart low dose ibuprofen 200-400 mg every 8 hours provided you can tolerate with no GI effects.  Use flexeril at night.  tramadol for pain(rx advisement)  thermacare heat patch.   Back exercises as tolerated.  If persists consider refer to specialist or get mri.  Xray of lumbar spine today.  Red flag symptoms that would require ED eval discussed.  Follow up in 7-10 days or as needed

## 2016-10-13 NOTE — Progress Notes (Signed)
Pre visit review using our clinic review tool, if applicable. No additional management support is needed unless otherwise documented below in the visit note. 

## 2016-10-14 ENCOUNTER — Telehealth: Payer: Self-pay | Admitting: *Deleted

## 2016-10-14 NOTE — Telephone Encounter (Signed)
-----   Message from Mackie Pai, PA-C sent at 10/13/2016 10:52 PM EDT ----- Mild degenerative changes and mild scoliosis. I think most of his pain may be muscular pain but some pain from bone.Marland Kitchen How is he doing. Any improvement.

## 2016-10-14 NOTE — Telephone Encounter (Signed)
Patient notified of results.  Patient states he is a little better.  He states that he still has pain when taking the pain medication.  He would like to know if he can have a muscle relaxer he can take during the day and if he can take Aleve instead of ibuprofen?

## 2016-10-15 NOTE — Telephone Encounter (Signed)
He can take muscle relaxant during the day as long as he feels steady on his feet  And does not drive. He can use allever instead of ibuprofen. But not both.

## 2016-10-15 NOTE — Telephone Encounter (Signed)
Patient notified

## 2016-10-20 ENCOUNTER — Ambulatory Visit (INDEPENDENT_AMBULATORY_CARE_PROVIDER_SITE_OTHER): Payer: 59 | Admitting: Medical

## 2016-10-20 VITALS — BP 132/80 | HR 90 | Temp 97.6°F | Resp 16 | Ht 69.0 in | Wt 198.6 lb

## 2016-10-20 DIAGNOSIS — M545 Low back pain, unspecified: Secondary | ICD-10-CM

## 2016-10-20 NOTE — Progress Notes (Signed)
Pre visit review using our clinic review tool, if applicable. No additional management support is needed unless otherwise documented below in the visit note. 

## 2016-10-20 NOTE — Patient Instructions (Signed)
Your pain is much better now. I think you will gradually get better. If not then please let us know.  Can start the exercises I printed off on last visit.  Follow up with [place] as needed.

## 2016-10-20 NOTE — Progress Notes (Signed)
Subjective:    Patient ID: Joshua [name], male    DOB: [birth year], 66 y.o.   MRN: [id number]  HPI  Pt in with some low level back pain. Now level 2-3.   Pt states he saw orthopedist for some another area of pain. They gave him tapered dose of prednisone.  Pt stopped taking the tramadol.  Pt is finishing the tapered prednisone today.    Review of Systems  Constitutional: Negative for chills, fatigue and fever.  Respiratory: Negative for cough, chest tightness, shortness of breath and wheezing.   Cardiovascular: Negative for chest pain and palpitations.  Gastrointestinal: Negative for abdominal pain.  Musculoskeletal: Positive for back pain. Negative for gait problem, myalgias, neck pain and neck stiffness.  Skin: Negative for pallor and rash.  Neurological:       No radicular pain.  Hematological: Negative for adenopathy. Does not bruise/bleed easily.  Psychiatric/Behavioral: Negative for behavioral problems, confusion, dysphoric mood and self-injury. The patient is not hyperactive.     Past Medical History:  Diagnosis Date  . BPH (benign prostatic hyperplasia)   . GERD (gastroesophageal reflux disease)   . HSV infection   . Hypertension      Social History   Social History  . Marital status: Married    Spouse name: N/A  . Number of children: N/A  . Years of education: N/A   Occupational History  . Not on file.   Social History Main Topics  . Smoking status: Former Smoker    Packs/day: 0.50    Years: 22.00    Types: Cigarettes    Quit date: 06/28/1993  . Smokeless tobacco: Never Used  . Alcohol use Yes     Comment: occasionally  . Drug use: No  . Sexual activity: Not on file   Other Topics Concern  . Not on file   Social History Narrative  . No narrative on file    Past Surgical History:  Procedure Laterality Date  . APPENDECTOMY    . CYSTECTOMY Left 2002   on left breast  . TONSILLECTOMY      Family History  Problem Relation Age of Onset  .  Alzheimer's disease Father     No Known Allergies  Current Outpatient Prescriptions on File Prior to Visit  Medication Sig Dispense Refill  . Albuterol Sulfate 108 (90 Base) MCG/ACT AEPB Inhale into the lungs.    Marland Kitchen alfuzosin (UROXATRAL) 10 MG 24 hr tablet Take 1 tablet by mouth daily.    Marland Kitchen amLODipine (NORVASC) 5 MG tablet Take 1 tablet (5 mg total) by mouth daily. 90 tablet 3  . budesonide-formoterol (SYMBICORT) 80-4.5 MCG/ACT inhaler Inhale 2 puffs into the lungs 2 (two) times daily. 1 Inhaler 11  . cyclobenzaprine (FLEXERIL) 5 MG tablet Take 1 tablet (5 mg total) by mouth at bedtime. 10 tablet 1  . losartan (COZAAR) 100 MG tablet Take 100 mg by mouth daily.    . pantoprazole (PROTONIX) 40 MG tablet Take 1 tablet (40 mg total) by mouth daily. 30 tablet 11   No current facility-administered medications on file prior to visit.     BP 132/80 (BP Location: Left Arm, Patient Position: Sitting, Cuff Size: Normal)   Pulse 90   Temp 97.6 F (36.4 C) (Oral)   Resp 16   Ht 5\' 9"  (1.753 m)   Wt 198 lb 9.6 oz (90.1 kg)   SpO2 98%   BMI 29.33 kg/m       Objective:   Physical Exam  General Appearance- Not in acute distress.    Chest and Lung Exam Auscultation: Breath sounds:-Normal. Clear even and unlabored. Adventitious sounds:- No Adventitious sounds.  Cardiovascular Auscultation:Rythm - Regular, rate and rythm. Heart Sounds -Normal heart sounds.  Abdomen Inspection:-Inspection Normal.  Palpation/Perucssion: Palpation and Percussion of the abdomen reveal- Non Tender, No Rebound tenderness, No rigidity(Guarding) and No Palpable abdominal masses.  Liver:-Normal.  Spleen:- Normal.   Back- no mid lumbar pain, faint para lumbar tenderness rt side. Some faint mild pain on rotating thorax and bending over.       Assessment & Plan:  Your pain is much better now. I think you will gradually get better. If not then please let us know.  Can start the exercises I printed  off on last visit.  Follow up with [place] as needed.

## 2016-11-18 ENCOUNTER — Other Ambulatory Visit: Payer: Self-pay | Admitting: Emergency Medicine

## 2016-11-18 ENCOUNTER — Telehealth: Payer: Self-pay | Admitting: Family Medicine

## 2016-11-18 MED ORDER — LOSARTAN POTASSIUM 100 MG PO TABS: 100.0000 mg | ORAL_TABLET | Freq: Every day | ORAL | 2 refills | Status: DC

## 2016-11-18 NOTE — Telephone Encounter (Signed)
Caller name: Relationship to patient: Self Can be reached: [phone]  Pharmacy:  Reason for call: Request refill losartan (COZAAR) 100 MG tablet

## 2016-11-18 NOTE — Telephone Encounter (Signed)
Refill sent per pt request.  

## 2017-02-02 ENCOUNTER — Other Ambulatory Visit: Payer: Self-pay | Admitting: Family Medicine

## 2017-02-02 NOTE — Telephone Encounter (Signed)
Relation to GU:YQIH Call back [phone] Pharmacy: [facility] [phone]  Reason for call:  Patient requesting 90 day supply losartan (COZAAR) 100 MG tablet please send to new pharmacy [facility]

## 2017-02-04 MED ORDER — LOSARTAN POTASSIUM 100 MG PO TABS: 100.0000 mg | ORAL_TABLET | Freq: Every day | ORAL | 0 refills | Status: DC

## 2017-02-04 NOTE — Telephone Encounter (Signed)
Last filled: 11/18/16 Amt: 30,2  Last OV:   09/30/16 (seen by Dr. Lorelei Pont) Lab:  10/01/16  Rx sent to the pharmacy.

## 2017-03-25 ENCOUNTER — Other Ambulatory Visit: Payer: Self-pay | Admitting: Family Medicine

## 2017-03-25 ENCOUNTER — Other Ambulatory Visit: Payer: Self-pay | Admitting: Emergency Medicine

## 2017-03-25 MED ORDER — ALBUTEROL SULFATE 108 (90 BASE) MCG/ACT IN AEPB: 2.0000 | INHALATION_SPRAY | Freq: Four times a day (QID) | RESPIRATORY_TRACT | 6 refills | Status: DC | PRN

## 2017-03-25 NOTE — Telephone Encounter (Signed)
Self.   Refill request for Pro-Air 19 MG for asthma    Pharmacy: [id number] - [place], [place]

## 2017-03-25 NOTE — Telephone Encounter (Signed)
Refill sent.

## 2017-03-28 MED ORDER — ALBUTEROL SULFATE HFA 108 (90 BASE) MCG/ACT IN AERS: 1.0000 | INHALATION_SPRAY | Freq: Four times a day (QID) | RESPIRATORY_TRACT | 2 refills | Status: DC | PRN

## 2017-03-28 NOTE — Telephone Encounter (Signed)
Pt called in, he said he now need the Imhalation aerosol instead. He said that it is the same medication but it is used differently.    Please advise.

## 2017-03-28 NOTE — Telephone Encounter (Signed)
Called pharmacy- he needs proair HFA. Called in for him

## 2017-04-01 DIAGNOSIS — Z23 Encounter for immunization: Secondary | ICD-10-CM | POA: Diagnosis not present

## 2017-04-13 ENCOUNTER — Ambulatory Visit (INDEPENDENT_AMBULATORY_CARE_PROVIDER_SITE_OTHER): Payer: Medicare Other | Admitting: Family Medicine

## 2017-04-13 VITALS — BP 148/88 | HR 78 | Temp 98.4°F | Ht 69.0 in | Wt 203.0 lb

## 2017-04-13 DIAGNOSIS — R350 Frequency of micturition: Secondary | ICD-10-CM

## 2017-04-13 LAB — POCT URINALYSIS DIP (MANUAL ENTRY)
Bilirubin, UA: NEGATIVE
GLUCOSE UA: NEGATIVE mg/dL
Ketones, POC UA: NEGATIVE mg/dL
Leukocytes, UA: NEGATIVE
NITRITE UA: NEGATIVE
Protein Ur, POC: NEGATIVE mg/dL
RBC UA: NEGATIVE
Spec Grav, UA: 1.025 (ref 1.010–1.025)
Urobilinogen, UA: 0.2 E.U./dL
pH, UA: 6 (ref 5.0–8.0)

## 2017-04-13 MED ORDER — SULFAMETHOXAZOLE-TRIMETHOPRIM 800-160 MG PO TABS: 1.0000 | ORAL_TABLET | Freq: Two times a day (BID) | ORAL | 0 refills | Status: DC

## 2017-04-13 NOTE — Progress Notes (Addendum)
[facility] [address] [place], [place] [address] [phone]  Date:  04/13/2017   Name:  Joshua [name]   DOB:  [birth year]   MRN:  [id number]  PCP:  Darreld Mclean, MD    Chief Complaint: Urinary Frequency (Pt sates that hes been having frequent urination since last night and wants to know if he has a UTI or if there's "something with" his prostate. ) and Back Pain   History of Present Illness:  Joshua [name] is a 67 y.o. very pleasant male patient who presents with the following:  Here today with concern of possible UTI or prostate issue He has BPH and often has to urinate a couple of times during the night Last night he went to bed normally, but woke in the night and had to urinate several times, he did not sleep well because he kept having to get up and urinate the rest of the night He feels a pressure in his bladder but otherwise no abd discomfort No pain with urination He did not notice any blood in his urine No fever He does have back pain but this is baseline for him   He had been seeing a urologist in [place]- they were just following his PSA/ BPH He has had a kidney stone several years ago- this does not seem like a stone to him He needed some sort of stone retrieval procedure in the past- he remembers this being very painful!    He is looking forward to visiting his toddler grand-daughter in [place] soon, and then her family is coming to [facility] for Thanksgiving  Patient Active Problem List   Diagnosis Date Noted  . Sinusitis, chronic/Cough 07/22/2016  . Chronic asthma, mild persistent, uncomplicated 05/39/7673  . Upper airway cough syndrome 06/29/2016    Past Medical History:  Diagnosis Date  . BPH (benign prostatic hyperplasia)   . GERD (gastroesophageal reflux disease)   . HSV infection   . Hypertension     Past Surgical History:  Procedure Laterality Date  . APPENDECTOMY    . CYSTECTOMY Left  2002   on left breast  . TONSILLECTOMY      Social History  Substance Use Topics  . Smoking status: Former Smoker    Packs/day: 0.50    Years: 22.00    Types: Cigarettes    Quit date: 06/28/1993  . Smokeless tobacco: Never Used  . Alcohol use Yes     Comment: occasionally    Family History  Problem Relation Age of Onset  . Alzheimer's disease Father     No Known Allergies  Medication list has been reviewed and updated.  Current Outpatient Prescriptions on File Prior to Visit  Medication Sig Dispense Refill  . albuterol (PROAIR HFA) 108 (90 Base) MCG/ACT inhaler Inhale 1-2 puffs into the lungs every 6 (six) hours as needed for wheezing or shortness of breath. 1 each 2  . Albuterol Sulfate 108 (90 Base) MCG/ACT AEPB Inhale 2 puffs into the lungs every 6 (six) hours as needed. 1 each 6  . alfuzosin (UROXATRAL) 10 MG 24 hr tablet Take 1 tablet by mouth daily.    Marland Kitchen amLODipine (NORVASC) 5 MG tablet Take 1 tablet (5 mg total) by mouth daily. 90 tablet 3  . budesonide-formoterol (SYMBICORT) 80-4.5 MCG/ACT inhaler Inhale 2 puffs into the lungs 2 (two) times daily. 1 Inhaler 11  . cyclobenzaprine (FLEXERIL) 5 MG tablet Take 1 tablet (5  mg total) by mouth at bedtime. 10 tablet 1  . losartan (COZAAR) 100 MG tablet Take 1 tablet (100 mg total) by mouth daily. 90 tablet 0  . pantoprazole (PROTONIX) 40 MG tablet Take 1 tablet (40 mg total) by mouth daily. 30 tablet 11   No current facility-administered medications on file prior to visit.     Review of Systems:  As per HPI- otherwise negative. No fever or chills No CP or SOB No nausea, vomiting or diarrhea   Physical Examination: Vitals:   04/14/17 0812  BP: (!) 148/88  Pulse: 78  Temp: 98.4 F (36.9 C)  SpO2: 98%   Vitals:   04/14/17 0812  Weight: 203 lb (92.1 kg)  Height: 5\' 9"  (1.753 m)   Body mass index is 29.98 kg/m. Ideal Body Weight: Weight in (lb) to have BMI = 25: 168.9  GEN: WDWN, NAD, Non-toxic, A & O x  3 HEENT: Atraumatic, Normocephalic. Neck supple. No masses, No LAD. Ears and Nose: No external deformity. CV: RRR, No M/G/R. No JVD. No thrill. No extra heart sounds. PULM: CTA B, no wheezes, crackles, rhonchi. No retractions. No resp. distress. No accessory muscle use. ABD: S, NT, ND, +BS. No rebound. No HSM. No CVA tenderness abd is benign No testicular masses or tenderness. Prostate is non tender to exam EXTR: No c/c/e NEURO Normal gait.  PSYCH: Normally interactive. Conversant. Not depressed or anxious appearing.  Calm demeanor.  Pulse approx 80 BPM at my ascultation  Results for orders placed or performed in visit on 04/13/17  POCT urinalysis dipstick  Result Value Ref Range   Color, UA straw (A) yellow   Clarity, UA clear clear   Glucose, UA negative negative mg/dL   Bilirubin, UA negative negative   Ketones, POC UA negative negative mg/dL   Spec Grav, UA 1.025 1.010 - 1.025   Blood, UA negative negative   pH, UA 6.0 5.0 - 8.0   Protein Ur, POC negative negative mg/dL   Urobilinogen, UA 0.2 0.2 or 1.0 E.U./dL   Nitrite, UA Negative Negative   Leukocytes, UA Negative Negative     Assessment and Plan: Urinary frequency - Plan: sulfamethoxazole-trimethoprim (BACTRIM DS,SEPTRA DS) 800-160 MG tablet, POCT urinalysis dipstick, Urine Culture, PSA, Basic metabolic panel  Here today with complaint of urinary frequency Prostatitis vs UTI Will start treatment with septra Await culture BW ordered as above also   Signed Lamar Blinks, MD  Received labs and gave him a call 10/20- LMOM.  Urine culture negative.  Please call on Monday and let me know how he is feeling Copy of labs to pt   Results for orders placed or performed in visit on 04/13/17  Urine Culture  Result Value Ref Range   MICRO NUMBER: [id number]    SPECIMEN QUALITY: ADEQUATE    Sample Source URINE    STATUS: FINAL    Result:      Single organism less than 10,000 CFU/mL isolated. These organisms, commonly  found on external and internal genitalia, are considered colonizers. No further testing performed.  PSA  Result Value Ref Range   PSA 0.90 0.10 - 4.00 ng/mL  Basic metabolic panel  Result Value Ref Range   Sodium 139 135 - 145 mEq/L   Potassium 4.1 3.5 - 5.1 mEq/L   Chloride 104 96 - 112 mEq/L   CO2 30 19 - 32 mEq/L   Glucose, Bld 99 70 - 99 mg/dL   BUN 16 6 - 23 mg/dL   Creatinine,  Ser 1.11 0.40 - 1.50 mg/dL   Calcium 9.0 8.4 - 10.5 mg/dL   GFR 70.59 >60.00 mL/min  POCT urinalysis dipstick  Result Value Ref Range   Color, UA straw (A) yellow   Clarity, UA clear clear   Glucose, UA negative negative mg/dL   Bilirubin, UA negative negative   Ketones, POC UA negative negative mg/dL   Spec Grav, UA 1.025 1.010 - 1.025   Blood, UA negative negative   pH, UA 6.0 5.0 - 8.0   Protein Ur, POC negative negative mg/dL   Urobilinogen, UA 0.2 0.2 or 1.0 E.U./dL   Nitrite, UA Negative Negative   Leukocytes, UA Negative Negative

## 2017-04-13 NOTE — Patient Instructions (Signed)
You may have a urinary trace infection or a prostate infection We are going to treat you with antibiotics- please use this twice a day for 7 days. I gave you enough for 10 days however in case your symptoms are more persistent. I will be in touch with your labs asap Please let me know if you are getting worse or have any fever or other symptoms!

## 2017-04-14 ENCOUNTER — Encounter: Payer: Self-pay | Admitting: Family Medicine

## 2017-04-14 LAB — BASIC METABOLIC PANEL
BUN: 16 mg/dL (ref 6–23)
CHLORIDE: 104 meq/L (ref 96–112)
CO2: 30 mEq/L (ref 19–32)
CREATININE: 1.11 mg/dL (ref 0.40–1.50)
Calcium: 9 mg/dL (ref 8.4–10.5)
GFR: 70.59 mL/min (ref >60.00)
GLUCOSE: 99 mg/dL (ref 70–99)
Potassium: 4.1 mEq/L (ref 3.5–5.1)
Sodium: 139 mEq/L (ref 135–145)

## 2017-04-14 LAB — URINE CULTURE
MICRO NUMBER: 81159318
SPECIMEN QUALITY: ADEQUATE

## 2017-04-14 LAB — PSA: PSA: 0.9 ng/mL (ref 0.10–4.00)

## 2017-04-20 ENCOUNTER — Ambulatory Visit (INDEPENDENT_AMBULATORY_CARE_PROVIDER_SITE_OTHER): Payer: Medicare Other | Admitting: Podiatry

## 2017-04-20 ENCOUNTER — Encounter: Payer: Self-pay | Admitting: Podiatry

## 2017-04-20 DIAGNOSIS — B351 Tinea unguium: Secondary | ICD-10-CM | POA: Diagnosis not present

## 2017-04-20 DIAGNOSIS — M779 Enthesopathy, unspecified: Secondary | ICD-10-CM

## 2017-04-20 NOTE — Patient Instructions (Signed)

## 2017-04-20 NOTE — Progress Notes (Signed)
   Subjective:    Patient ID: Joshua [name], male    DOB: 07-02-1950, 66 y.o.   MRN: [id number]  HPI    Review of Systems  All other systems reviewed and are negative.      Objective:   Physical Exam        Assessment & Plan:

## 2017-04-20 NOTE — Progress Notes (Signed)
Subjective:    Patient ID: Joshua [name], male   DOB: 66 y.o.   MRN: [id number]   HPI patient states that he has toenail fungus on his left big toe and has history of laser which did help him and he like to pursue this treatment. Also complains of his right ankle bothering him at times    Review of Systems  All other systems reviewed and are negative.       Objective:  Physical Exam  Constitutional: He appears well-developed and well-nourished.  Cardiovascular: Intact distal pulses.   Pulmonary/Chest: Effort normal.  Musculoskeletal: Normal range of motion.  Neurological: He is alert.  Skin: Skin is warm.  Nursing note and vitals reviewed.  neurovascular status found to be intact with muscle strength adequate range of motion within normal limits with patient found to have moderate discomfort in the sinus tarsi right with no indication of ankle instability and nail disease of the hallux left over right distal one third. Patient does not smoke and likes to be active     Assessment:    Sinus tarsitis right with mycotic nail infection hallux left over right     Plan:  H&P and education on both conditions given to patient. At this point we'll defer treatment on the ankle except for stable-type shoes and we discussed the nails and he's given defer oral treatment and he will utilize topical medication and we will initiate laser therapy for the left and right hallux. Patient scheduled to have this done

## 2017-05-04 ENCOUNTER — Other Ambulatory Visit: Payer: Medicare Other

## 2017-05-11 ENCOUNTER — Other Ambulatory Visit: Payer: Self-pay | Admitting: Family Medicine

## 2017-05-13 ENCOUNTER — Ambulatory Visit (INDEPENDENT_AMBULATORY_CARE_PROVIDER_SITE_OTHER): Payer: Medicare Other

## 2017-05-13 DIAGNOSIS — B351 Tinea unguium: Secondary | ICD-10-CM

## 2017-05-23 NOTE — Progress Notes (Signed)
   Subjective:    Patient ID: Joshua [name], male    DOB: [birth year], 66 y.o.   MRN: [id number]  HPI Pt presents with mycotic infection of nails  All other systems are negative  Laser therapy administered to affected nails and tolerated well. All safety precautions were in place   Review of Systems     Objective:   Physical Exam        Assessment & Plan:

## 2017-05-26 DIAGNOSIS — N401 Enlarged prostate with lower urinary tract symptoms: Secondary | ICD-10-CM | POA: Diagnosis not present

## 2017-05-26 DIAGNOSIS — N138 Other obstructive and reflux uropathy: Secondary | ICD-10-CM | POA: Diagnosis not present

## 2017-05-26 DIAGNOSIS — R350 Frequency of micturition: Secondary | ICD-10-CM | POA: Diagnosis not present

## 2017-06-10 ENCOUNTER — Other Ambulatory Visit: Payer: Medicare Other

## 2017-06-13 ENCOUNTER — Other Ambulatory Visit: Payer: Self-pay | Admitting: Internal Medicine

## 2017-06-13 ENCOUNTER — Ambulatory Visit (INDEPENDENT_AMBULATORY_CARE_PROVIDER_SITE_OTHER): Payer: Medicare Other

## 2017-06-13 DIAGNOSIS — B351 Tinea unguium: Secondary | ICD-10-CM

## 2017-06-17 DIAGNOSIS — L218 Other seborrheic dermatitis: Secondary | ICD-10-CM | POA: Diagnosis not present

## 2017-06-17 DIAGNOSIS — L738 Other specified follicular disorders: Secondary | ICD-10-CM | POA: Diagnosis not present

## 2017-06-23 DIAGNOSIS — N138 Other obstructive and reflux uropathy: Secondary | ICD-10-CM | POA: Diagnosis not present

## 2017-06-23 DIAGNOSIS — N401 Enlarged prostate with lower urinary tract symptoms: Secondary | ICD-10-CM | POA: Diagnosis not present

## 2017-07-05 NOTE — Progress Notes (Signed)
   Subjective:    Patient ID: Joshua [name], male    DOB: [birth year], 67 y.o.   MRN: [id number]  HPI  Pt presents with mycotic infection of nails  All other systems are negative  Laser therapy administered to affected nails and tolerated well. All safety precautions were in place   Review of Systems      Objective:   Physical Exam         Assessment & Plan:

## 2017-07-11 ENCOUNTER — Ambulatory Visit: Payer: Self-pay | Admitting: Podiatry

## 2017-07-11 DIAGNOSIS — B351 Tinea unguium: Secondary | ICD-10-CM

## 2017-07-14 NOTE — Progress Notes (Signed)
   Subjective:    Patient ID: Joshua [name], male    DOB: 01-28-1951, 67 y.o.   MRN: [id number]  HPI Pt presents with mycotic infection of nails  All other systems are negative  Laser therapy administered to affected nails and tolerated well. All safety precautions were in place. REappointed in 4 weeks    Review of Systems     Objective:   Physical Exam        Assessment & Plan:

## 2017-07-26 ENCOUNTER — Other Ambulatory Visit: Payer: Self-pay | Admitting: Internal Medicine

## 2017-08-01 ENCOUNTER — Encounter: Payer: Self-pay | Admitting: Gastroenterology

## 2017-08-04 ENCOUNTER — Other Ambulatory Visit: Payer: Self-pay | Admitting: Family Medicine

## 2017-08-08 ENCOUNTER — Ambulatory Visit (INDEPENDENT_AMBULATORY_CARE_PROVIDER_SITE_OTHER): Payer: Medicare Other

## 2017-08-08 DIAGNOSIS — B351 Tinea unguium: Secondary | ICD-10-CM

## 2017-08-08 NOTE — Progress Notes (Signed)
   Subjective:    Patient ID: Joshua [name], male    DOB: January 08, 1951, 67 y.o.   MRN: [id number]  HPI Pt presents with mycotic infection of nails  All other systems are negative  Laser therapy administered to affected nails and tolerated well. All safety precautions were in place. REappointed in 4 weeks    Review of Systems     Objective:   Physical Exam        Assessment & Plan:

## 2017-08-09 ENCOUNTER — Other Ambulatory Visit: Payer: Self-pay | Admitting: Internal Medicine

## 2017-08-15 ENCOUNTER — Ambulatory Visit: Payer: Medicare Other | Admitting: Gastroenterology

## 2017-08-17 ENCOUNTER — Telehealth: Payer: Self-pay | Admitting: Family Medicine

## 2017-08-17 ENCOUNTER — Other Ambulatory Visit: Payer: Self-pay | Admitting: Emergency Medicine

## 2017-08-17 MED ORDER — PANTOPRAZOLE SODIUM 40 MG PO TBEC: 40.0000 mg | DELAYED_RELEASE_TABLET | Freq: Every day | ORAL | 0 refills | Status: DC

## 2017-08-17 NOTE — Telephone Encounter (Signed)
Refill sent to pharmacy per pt request.

## 2017-08-17 NOTE — Telephone Encounter (Signed)
Copied from [place] [id number]. Topic: Quick Communication - Rx Refill/Question >> Aug 17, 2017 11:25 AM Margot Ables wrote: Medication: pantoprazole (PROTONIX) 40 MG tablet - takes 1/day - has enough for a week - pt is scheduled with GI but needing RX filled before he sees GI Has the patient contacted their pharmacy? Yes.  Was different doctor Preferred Pharmacy (with phone number or street name): [id number] - [place], [place] [address] [phone] (Phone) [phone] (Fax)  Agent: Please be advised that RX refills may take up to 3 business days. We ask that you follow-up with your pharmacy.

## 2017-08-31 ENCOUNTER — Other Ambulatory Visit: Payer: Self-pay | Admitting: Internal Medicine

## 2017-09-05 ENCOUNTER — Ambulatory Visit (INDEPENDENT_AMBULATORY_CARE_PROVIDER_SITE_OTHER)
Admission: RE | Admit: 2017-09-05 | Discharge: 2017-09-05 | Disposition: A | Discharge status: Home / Self Care | Payer: Medicare Other | Source: Ambulatory Visit | Attending: Internal Medicine | Admitting: Internal Medicine

## 2017-09-05 ENCOUNTER — Encounter: Payer: Self-pay | Admitting: Internal Medicine

## 2017-09-05 ENCOUNTER — Other Ambulatory Visit: Payer: Medicare Other

## 2017-09-05 ENCOUNTER — Telehealth: Payer: Self-pay

## 2017-09-05 ENCOUNTER — Ambulatory Visit (INDEPENDENT_AMBULATORY_CARE_PROVIDER_SITE_OTHER): Payer: Medicare Other | Admitting: Internal Medicine

## 2017-09-05 VITALS — BP 140/76 | HR 77 | Ht 68.0 in | Wt 204.0 lb

## 2017-09-05 DIAGNOSIS — R911 Solitary pulmonary nodule: Secondary | ICD-10-CM | POA: Diagnosis not present

## 2017-09-05 DIAGNOSIS — J4530 Mild persistent asthma, uncomplicated: Secondary | ICD-10-CM

## 2017-09-05 DIAGNOSIS — R05 Cough: Secondary | ICD-10-CM | POA: Diagnosis not present

## 2017-09-05 DIAGNOSIS — R058 Other specified cough: Secondary | ICD-10-CM

## 2017-09-05 DIAGNOSIS — I10 Essential (primary) hypertension: Secondary | ICD-10-CM | POA: Diagnosis not present

## 2017-09-05 MED ORDER — BUDESONIDE-FORMOTEROL FUMARATE 160-4.5 MCG/ACT IN AERO: 2.0000 | INHALATION_SPRAY | Freq: Two times a day (BID) | RESPIRATORY_TRACT | 11 refills | Status: DC

## 2017-09-05 MED ORDER — BUDESONIDE-FORMOTEROL FUMARATE 160-4.5 MCG/ACT IN AERO: 2.0000 | INHALATION_SPRAY | Freq: Two times a day (BID) | RESPIRATORY_TRACT | 0 refills | Status: DC

## 2017-09-05 MED ORDER — PANTOPRAZOLE SODIUM 40 MG PO TBEC: 40.0000 mg | DELAYED_RELEASE_TABLET | Freq: Every day | ORAL | 2 refills | Status: DC

## 2017-09-05 NOTE — Telephone Encounter (Signed)
See result note same date

## 2017-09-05 NOTE — Progress Notes (Signed)
Subjective:     Patient ID: Joshua [name], male   DOB: 1951/03/13,    MRN: [id number]    Brief patient profile:  67yo [name] with bad bronchitis around 1993 and quit smoking and then  all better until around 2000 while living in [place] with intermittent "wheezing" dx as asthma > never resolved  Self referred for dtc asthma   History of Present Illness  06/29/2016 1st [facility] office visit/ [facility]   Chief Complaint  Patient presents with  . Pulmonary Consult    moved from [place], chronic bronchitis, wheezing, dx with asthma in 2000, cold humidity makes it worse  maint on symb 160 2bid and at baseline intermittently noisy breathing one breath q 2-3 days  s pattern though not waking him  noct   Worse since  Jun 22 2016  with uri/ sinus symptoms >  [facility] rx  Amox/only a little better, still severe nasal congestion/severe fits of day > noct cough and subj wheeze.  Typical triggers are uri/not seasonal pattern rhintiis - happens up to 4 x yearly with or without symbicort and just as severe. rec Augmentin 875 mg take one pill twice daily  X 10 days   Please see patient coordinator before you leave today  to schedule CT sinus  in 10 days - no sooner Use nasal saline as much as possible to help moisturize your passages  Continue protonix but take it Take 30-60 min before first meal of the day  Prednisone 10 mg take  4 each am x 2 days,   2 each am x 2 days,  1 each am x 2 days and stop  Work on inhaler technique:  Plan A = Automatic = symbicort 80 Take 2 puffs first thing in am and then another 2 puffs about 12 hours later.  Plan B = Backup Only use your albuterol (Proir) as a rescue medication to be used if you can't catch your breath by resting or doing a relaxed purse lip breathing pattern.  - The less you use it, the better it will work when you need it. - Ok to use the inhaler up to 2 puffs  every 4 hours     07/21/2016  f/u ov/[facility] re: uacs vs asthma Chief Complaint  Patient presents  with  . Follow-up    Cough is much improved, but has not completely resolved. Breathing has improved.   cough is worse at bedtime and in am p rising but not waking up prematurely and not productive  Bloody nasal d/c / no sob at all  rec Use lots of saline nasal spray on your nose Please remember to go to the x-ray department downstairs for your tests - we will call you with the results when they are available. Ty off the symbicort to see if any worse and if so restart  At 2 pffs every 12 hour schedule ENT  >   [name] rec abx and f/u prn       09/23/2016  f/u ov/[facility] re: uacs vs asthma, was worse off symbicort so restarted and improved  Chief Complaint  Patient presents with  . Follow-up    Cough has completely resovled. He states he is using his albuterol inhaler 1 x every 2 wks on average.   Not limited by breathing from desired activities   rec Plan A = Automatic = symbicort 80 Take 2 puffs first thing in am and then another 2 puffs about 12 hours later.  Work  on inhaler technique:   Plan B = Backup Only use your albuterol as a rescue medication        09/05/2017  f/u ov/[facility] re: chronic asthma/? vcd component  Chief Complaint  Patient presents with  . Follow-up    Has noticed wheezing and chest tightness for the past few wks. He has been taking his symbicort in the am only. He uses his albuterol inhaler no more than once per wk.   Dyspnea:  Better when uses symbicort  Cough: none Sleep: rare chest tightness once a month x 20 min  / better p saba  / No assoc overt hb SABA use: not using when needing symb    No obvious day to day or daytime variability or assoc excess/ purulent sputum or mucus plugs or hemoptysis or cp or subjective wheeze or overt sinus  symptoms. No unusual exposure hx or h/o childhood pna/ asthma or knowledge of premature birth.  Sleeping ok flat without nocturnal  or early am exacerbation  of respiratory  c/o's or need for noct saba. Also denies any  obvious fluctuation of symptoms with weather or environmental changes or other aggravating or alleviating factors except as outlined above   Current Allergies, Complete Past Medical History, Past Surgical History, Family History, and Social History were reviewed in [employer] record.  ROS  The following are not active complaints unless bolded Hoarseness, sore throat, dysphagia, dental problems, itching, sneezing,  nasal congestion or discharge of excess mucus or purulent secretions, ear ache,   fever, chills, sweats, unintended wt loss or wt gain, classically pleuritic or exertional cp,  orthopnea pnd or leg swelling, presyncope, palpitations, abdominal pain, anorexia, nausea, vomiting, diarrhea  or change in bowel habits or change in bladder habits, change in stools or change in urine, dysuria, hematuria,  rash, arthralgias, visual complaints, headache, numbness, weakness or ataxia or problems with walking or coordination,  change in mood/affect or memory.        Current Meds  Medication Sig  . albuterol (PROAIR HFA) 108 (90 Base) MCG/ACT inhaler Inhale 1-2 puffs into the lungs every 6 (six) hours as needed for wheezing or shortness of breath.  . alfuzosin (UROXATRAL) 10 MG 24 hr tablet Take 1 tablet by mouth daily.  Marland Kitchen amLODipine (NORVASC) 5 MG tablet Take 1 tablet (5 mg total) by mouth daily.  . cyclobenzaprine (FLEXERIL) 5 MG tablet Take 1 tablet (5 mg total) by mouth at bedtime.  Marland Kitchen losartan (COZAAR) 100 MG tablet TAKE 1 TABLET (100 MG TOTAL) BY MOUTH DAILY.  . pantoprazole (PROTONIX) 40 MG tablet Take 1 tablet (40 mg total) by mouth daily.  . [  budesonide-formoterol (SYMBICORT) 80-4.5 MCG/ACT inhaler Inhale 2 puffs into the lungs 2 (two) times daily.  .                Objective:   Physical Exam      09/05/2017       204 07/21/16 196 lb (88.9 kg)  06/29/16 197 lb 12.8 oz (89.7 kg)     amb slt hoarse wm nad  HEENT: nl dentition, turbinates bilaterally, and  oropharynx. Nl external ear canals without cough reflex   NECK :  without JVD/Nodes/TM/ nl carotid upstrokes bilaterally   LUNGS: no acc muscle use,  Nl contour chest which is clear to A and P bilaterally without cough on insp or exp maneuvers   CV:  RRR  no s3 or murmur or increase in P2, and no edema  ABD:  soft and nontender with nl inspiratory excursion in the supine position. No bruits or organomegaly appreciated, bowel sounds nl  MS:  Nl gait/ ext warm without deformities, calf tenderness, cyanosis or clubbing No obvious joint restrictions   SKIN: warm and dry without lesions    NEURO:  alert, approp, nl sensorium with  no motor or cerebellar deficits apparent.          CXR PA and Lateral:   09/05/2017 :    I personally reviewed images and agree with radiology impression as follows:   Subtle nodular density at the left lung base, only appreciable on the AP view, most likely superimposition of osseous structures and normal pulmonary vessels    Assessment:

## 2017-09-05 NOTE — Telephone Encounter (Signed)
Call report for CXR. MW please advise.    IMPRESSION: 1. Subtle nodular density at the left lung base, only appreciable on the AP view, most likely superimposition of osseous structures and normal pulmonary vessels. As such, would merely consider repeat PA chest x-ray with attention to this area. If persisted on subsequent chest x-ray, would then recommend chest CT for further characterization. 2. Otherwise normal chest x-ray.  These results will be called to the ordering clinician or representative by the Radiologist Assistant, and communication documented in the PACS or zVision Dashboard.

## 2017-09-05 NOTE — Patient Instructions (Addendum)
symbicort increase to 160 Take 2 puffs first thing in am and then another 2 puffs about 12 hours later   Work on inhaler technique:  relax and gently blow all the way out then take a nice smooth deep breath back in, triggering the inhaler at same time you start breathing in.  Hold for up to 5 seconds if you can. Blow out thru nose. Rinse and gargle with water when done     Please remember to go to the  x-ray department downstairs in the basement  for your tests - we will call you with the results when they are available.       Please schedule a follow up office visit in 4 weeks, sooner if needed with pfts on return    .

## 2017-09-06 NOTE — Progress Notes (Signed)
Spoke with pt and notified of results per Dr. Wert. Pt verbalized understanding and denied any questions. 

## 2017-09-07 ENCOUNTER — Encounter: Payer: Self-pay | Admitting: Internal Medicine

## 2017-09-07 DIAGNOSIS — R911 Solitary pulmonary nodule: Secondary | ICD-10-CM | POA: Insufficient documentation

## 2017-09-07 NOTE — Assessment & Plan Note (Signed)
See cxr 09/05/2017 > f/u one month as this may be artifact and pt is >> 28 y out from smoking so low but not 0 risk   Discussed in detail all the  indications, usual  risks and alternatives  relative to the benefits with patient who agrees to proceed with conservative f/u as outlined

## 2017-09-07 NOTE — Assessment & Plan Note (Signed)
06/29/2016    reduce symbicort to 80 2 bid  - FENO 07/21/2016  =   24 on symb 80 2bid - Spirometry 07/21/2016  wnl including  Mid flows p am symbicort > try off > flared so restarted   - 09/05/2017  After extensive coaching inhaler device  effectiveness =    90% > try symb increase to 160 due to noct chest tightness resp to saba and return for f/u pfts ? Needs more aggressive rx for gerd?   I had an extended discussion with the patient reviewing all relevant studies completed to date and  lasting 15 to 20 minutes of a 25 minute visit    Each maintenance medication was reviewed in detail including most importantly the difference between maintenance and prns and under what circumstances the prns are to be triggered using an action plan format that is not reflected in the computer generated alphabetically organized AVS.    Please see AVS for specific instructions unique to this visit that I personally wrote and verbalized to the the pt in detail and then reviewed with pt  by my nurse highlighting any  changes in therapy recommended at today's visit to their plan of care.

## 2017-09-07 NOTE — Assessment & Plan Note (Signed)
06/29/2016  After extensive coaching HFA effectiveness =    75% try lower strength of symb - sinus CT p 10 day augmentin  06/29/2016 >>>  CT  Sinus 07/09/2016 mild chronic changes only  - cough better 07/21/2016 > try just on gerd rx/ no more symbicort > flared off symbicort   Flare off symbicort favors cough variant asthma over uacs though cautioned the higher dose of symbicort rec today may aggravate the cough and if so should resume prior strength and continue diet/ rx for gerd

## 2017-09-09 ENCOUNTER — Ambulatory Visit (INDEPENDENT_AMBULATORY_CARE_PROVIDER_SITE_OTHER): Payer: Medicare Other | Admitting: Gastroenterology

## 2017-09-09 ENCOUNTER — Encounter: Payer: Self-pay | Admitting: Gastroenterology

## 2017-09-09 VITALS — BP 138/80 | HR 80 | Ht 68.25 in | Wt 205.2 lb

## 2017-09-09 DIAGNOSIS — Z1211 Encounter for screening for malignant neoplasm of colon: Secondary | ICD-10-CM | POA: Diagnosis not present

## 2017-09-09 DIAGNOSIS — K219 Gastro-esophageal reflux disease without esophagitis: Secondary | ICD-10-CM | POA: Diagnosis not present

## 2017-09-09 MED ORDER — PANTOPRAZOLE SODIUM 40 MG PO TBEC: 40.0000 mg | DELAYED_RELEASE_TABLET | Freq: Every day | ORAL | 3 refills | Status: DC

## 2017-09-09 MED ORDER — SUPREP BOWEL PREP KIT 17.5-3.13-1.6 GM/177ML PO SOLN
ORAL | 0 refills | Status: DC
Start: 2017-09-09 — End: 2017-12-01

## 2017-09-09 NOTE — Patient Instructions (Addendum)
If you are age 66 or older, your body mass index should be between 23-30. Your Body mass index is 30.98 kg/m. If this is out of the aforementioned range listed, please consider follow up with your Primary Care Provider.  If you are age 85 or younger, your body mass index should be between 19-25. Your Body mass index is 30.98 kg/m. If this is out of the aformentioned range listed, please consider follow up with your Primary Care Provider.   You have been scheduled for an endoscopy and colonoscopy. Please follow the written instructions given to you at your visit today. Please pick up your prep supplies at the pharmacy within the next 1-3 days. If you use inhalers (even only as needed), please bring them with you on the day of your procedure. Your physician has requested that you go to www.startemmi.com and enter the access code given to you at your visit today. This web site gives a general overview about your procedure. However, you should still follow specific instructions given to you by our office regarding your preparation for the procedure.  We have sent the following medications to your pharmacy for you to pick up at your convenience: Protonix 40 mg, Take once daily  Thank you for entrusting me with your care and for choosing [facility], Dr. [place]boro Cellar

## 2017-09-09 NOTE — Progress Notes (Signed)
HPI :  67 y/o male with a history of GERD, HTN, renal stones, referred here for new patient visit by Lamar Blinks MD for reflux.  He states he has had long-standing reflux for many years.  He endorses a history of H. pylori and associated peptic ulcer disease several years ago.  He has been on multiple PPIs in the past.  Most recently he has been on Protonix for the past 3 years and using it occasionally.  More recently he has required daily use for his reflux symptoms which she states tends to work fairly well when he takes it, but he still has heartburn at times, just not as severe.  He does not have much of any epigastric discomfort.  No dysphasia.  He denies any constipation or routine diarrhea.  He denies any blood in his stools.  He asks about long-term safety of PPI use.  His grandfather had colon cancer diagnosed in age 84s.  His last colonoscopy was done in 2008 and was normal exam.  He had an EGD done in 2013 showing active esophagitis, normal exam otherwise  Prior workup: EGD 06/24/2012 - mild esophagitis, normal exam otherwise Colonoscopy 01/24/2007 - normal exam  Past Medical History:  Diagnosis Date  . Asthma   . BPH (benign prostatic hyperplasia)   . GERD (gastroesophageal reflux disease)   . HLD (hyperlipidemia)   . HSV infection   . Hypertension   . Kidney stones      Past Surgical History:  Procedure Laterality Date  . APPENDECTOMY    . BREAST CYST EXCISION Left    benign  . LITHOTRIPSY    . TONSILLECTOMY     Family History  Problem Relation Age of Onset  . Alzheimer's disease Father   . Kidney Stones Mother   . Colon cancer Paternal Grandfather 60   Social History   Tobacco Use  . Smoking status: Former Smoker    Packs/day: 0.50    Years: 22.00    Pack years: 11.00    Types: Cigarettes    Last attempt to quit: 06/28/1993    Years since quitting: 24.2  . Smokeless tobacco: Never Used  Substance Use Topics  . Alcohol use: Yes    Comment:  occasionally  . Drug use: No   Current Outpatient Medications  Medication Sig Dispense Refill  . albuterol (PROAIR HFA) 108 (90 Base) MCG/ACT inhaler Inhale 1-2 puffs into the lungs every 6 (six) hours as needed for wheezing or shortness of breath. 1 each 2  . alfuzosin (UROXATRAL) 10 MG 24 hr tablet Take 1 tablet by mouth daily.    . budesonide-formoterol (SYMBICORT) 160-4.5 MCG/ACT inhaler Inhale 2 puffs into the lungs 2 (two) times daily. 1 Inhaler 11  . cyclobenzaprine (FLEXERIL) 5 MG tablet Take 1 tablet (5 mg total) by mouth at bedtime. 10 tablet 1  . losartan (COZAAR) 100 MG tablet TAKE 1 TABLET (100 MG TOTAL) BY MOUTH DAILY. 90 tablet 0  . pantoprazole (PROTONIX) 40 MG tablet Take 1 tablet (40 mg total) by mouth daily. 30 tablet 2   No current facility-administered medications for this visit.    No Known Allergies   Review of Systems: All systems reviewed and negative except where noted in HPI.   Lab Results  Component Value Date   WBC 6.0 10/01/2016   HGB 14.9 10/01/2016   HCT 44.1 10/01/2016   MCV 80.2 10/01/2016   PLT 256.0 10/01/2016    Lab Results  Component Value Date  CREATININE 1.11 04/13/2017   BUN 16 04/13/2017   NA 139 04/13/2017   K 4.1 04/13/2017   CL 104 04/13/2017   CO2 30 04/13/2017    Lab Results  Component Value Date   ALT 22 10/01/2016   AST 17 10/01/2016   ALKPHOS 73 10/01/2016   BILITOT 0.5 10/01/2016     Physical Exam: BP 138/80 (BP Location: Left Arm, Patient Position: Sitting, Cuff Size: Normal)   Pulse 80   Ht 5' 8.25" (1.734 m) Comment: height measured without shoes  Wt 205 lb 4 oz (93.1 kg)   BMI 30.98 kg/m  Constitutional: Pleasant,well-developed, male in no acute distress. HEENT: Normocephalic and atraumatic. Conjunctivae are normal. No scleral icterus. Neck supple.  Cardiovascular: Normal rate, regular rhythm.  Pulmonary/chest: Effort normal and breath sounds normal. No wheezing, rales or rhonchi. Abdominal: Soft,  nondistended, nontender. There are no masses palpable. No hepatomegaly. Extremities: no edema Lymphadenopathy: No cervical adenopathy noted. Neurological: Alert and oriented to person place and time. Skin: Skin is warm and dry. No rashes noted. Psychiatric: Normal mood and affect. Behavior is normal.   ASSESSMENT AND PLAN: 67 year old male with a long-standing history of GERD, here for new patient evaluation of the following:   GERD - long-standing symptoms as outlined above, now with recent worsening.  I discussed the long-term risks and benefits of chronic PPI use with him.  Risks including increased risk for kidney disease, increased risk for bone fracture, increased risk for C. difficile.  Once daily dosing seems to provide moderate control for him, he wishes to continue this.  Given esophagitis noted on his last upper endoscopy recommend repeat EGD at this time, ensure no evidence of Barrett's esophagus.  I discussed risk benefits of the procedure with him and he wanted to proceed.  Further recommendations pending the results.  Colon cancer screening - no active symptoms but is overdue for routine colon cancer screening.  Recommending optical colonoscopy.  Following discussion of the risks and benefits of this procedure he wanted to proceed.  [place] Cellar, MD [facility] Pager [phone]  CC: Copland, Gay Filler, MD

## 2017-10-03 ENCOUNTER — Encounter: Payer: Self-pay | Admitting: Internal Medicine

## 2017-10-03 ENCOUNTER — Telehealth: Payer: Self-pay | Admitting: Internal Medicine

## 2017-10-03 ENCOUNTER — Ambulatory Visit (INDEPENDENT_AMBULATORY_CARE_PROVIDER_SITE_OTHER): Payer: Medicare Other | Admitting: Internal Medicine

## 2017-10-03 ENCOUNTER — Ambulatory Visit (INDEPENDENT_AMBULATORY_CARE_PROVIDER_SITE_OTHER)
Admission: RE | Admit: 2017-10-03 | Discharge: 2017-10-03 | Disposition: A | Discharge status: Home / Self Care | Payer: Medicare Other | Source: Ambulatory Visit | Attending: Internal Medicine | Admitting: Internal Medicine

## 2017-10-03 VITALS — BP 138/82 | HR 82 | Ht 66.0 in | Wt 204.0 lb

## 2017-10-03 DIAGNOSIS — R911 Solitary pulmonary nodule: Secondary | ICD-10-CM | POA: Diagnosis not present

## 2017-10-03 DIAGNOSIS — J4530 Mild persistent asthma, uncomplicated: Secondary | ICD-10-CM

## 2017-10-03 LAB — PULMONARY FUNCTION TEST
DL/VA % pred: 113 %
DL/VA: 4.92 ml/min/mmHg/L
DLCO UNC: 29.3 ml/min/mmHg
DLCO unc % pred: 108 %
FEF 25-75 POST: 1.71 L/s
FEF 25-75 Pre: 1.24 L/sec
FEF2575-%CHANGE-POST: 37 %
FEF2575-%PRED-PRE: 53 %
FEF2575-%Pred-Post: 73 %
FEV1-%CHANGE-POST: 21 %
FEV1-%PRED-PRE: 72 %
FEV1-%Pred-Post: 88 %
FEV1-PRE: 2.11 L
FEV1-Post: 2.57 L
FEV1FVC-%CHANGE-POST: 15 %
FEV1FVC-%PRED-PRE: 78 %
FEV6-%Change-Post: 2 %
FEV6-%Pred-Post: 97 %
FEV6-%Pred-Pre: 95 %
FEV6-POST: 3.62 L
FEV6-Pre: 3.55 L
FEV6FVC-%Change-Post: -1 %
FEV6FVC-%PRED-POST: 103 %
FEV6FVC-%Pred-Pre: 105 %
FVC-%Change-Post: 5 %
FVC-%PRED-PRE: 92 %
FVC-%Pred-Post: 97 %
FVC-POST: 3.82 L
FVC-PRE: 3.64 L
PRE FEV1/FVC RATIO: 58 %
PRE FEV6/FVC RATIO: 99 %
Post FEV1/FVC ratio: 67 %
Post FEV6/FVC ratio: 97 %

## 2017-10-03 MED ORDER — BUDESONIDE-FORMOTEROL FUMARATE 80-4.5 MCG/ACT IN AERO
INHALATION_SPRAY | RESPIRATORY_TRACT | 11 refills | Status: DC
Start: 2017-10-03 — End: 2018-11-03

## 2017-10-03 NOTE — Telephone Encounter (Signed)
Called and spoke with patient regarding results.  Informed the patient of results and recommendations today. Pt verbalized understanding and denied any questions or concerns at this time.  Nothing further needed.  

## 2017-10-03 NOTE — Progress Notes (Signed)
PFT completed today 10/03/17    Unable to obtain pleth due to patient feeling uncomfortable.

## 2017-10-03 NOTE — Patient Instructions (Addendum)
Change back to symbicort 80 Take 2 puffs first thing in am and then another 2 puffs about 12 hours later.     Only use your albuterol as a rescue medication to be used if you can't catch your breath by resting or doing a relaxed purse lip breathing pattern.  - The less you use it, the better it will work when you need it. - Ok to use up to 2 puffs  every 4 hours if you must but call for immediate appointment if use goes up over your usual need - Don't leave home without it !!  (think of it like the spare tire for your car)    Please remember to go to the  x-ray department downstairs in the basement  for your tests - we will call you with the results when they are available.      Please schedule a follow up visit in 3 months but call sooner if needed  - late add consider singulair add next

## 2017-10-03 NOTE — Progress Notes (Signed)
LMTCB

## 2017-10-03 NOTE — Progress Notes (Signed)
Subjective:     Patient ID: Joshua [name], male   DOB: 1951/03/13,    MRN: [id number]    Brief patient profile:  67yo [name] with bad bronchitis around 1993 and quit smoking and then  all better until around 2000 while living in [place] with intermittent "wheezing" dx as asthma > never resolved  Self referred for dtc asthma   History of Present Illness  06/29/2016 1st [facility] office visit/ Joshua [name]   Chief Complaint  Patient presents with  . Pulmonary Consult    moved from [place], chronic bronchitis, wheezing, dx with asthma in 2000, cold humidity makes it worse  maint on symb 160 2bid and at baseline intermittently noisy breathing one breath q 2-3 days  s pattern though not waking him  noct   Worse since  Jun 22 2016  with uri/ sinus symptoms >  [facility] rx  Amox/only a little better, still severe nasal congestion/severe fits of day > noct cough and subj wheeze.  Typical triggers are uri/not seasonal pattern rhintiis - happens up to 4 x yearly with or without symbicort and just as severe. rec Augmentin 875 mg take one pill twice daily  X 10 days   Please see patient coordinator before you leave today  to schedule CT sinus  in 10 days - no sooner Use nasal saline as much as possible to help moisturize your passages  Continue protonix but take it Take 30-60 min before first meal of the day  Prednisone 10 mg take  4 each am x 2 days,   2 each am x 2 days,  1 each am x 2 days and stop  Work on inhaler technique:  Plan A = Automatic = symbicort 80 Take 2 puffs first thing in am and then another 2 puffs about 12 hours later.  Plan B = Backup Only use your albuterol (Proir) as a rescue medication to be used if you can't catch your breath by resting or doing a relaxed purse lip breathing pattern.  - The less you use it, the better it will work when you need it. - Ok to use the inhaler up to 2 puffs  every 4 hours     07/21/2016  f/u ov/Joshua [name] re: uacs vs asthma Chief Complaint  Patient presents  with  . Follow-up    Cough is much improved, but has not completely resolved. Breathing has improved.   cough is worse at bedtime and in am p rising but not waking up prematurely and not productive  Bloody nasal d/c / no sob at all  rec Use lots of saline nasal spray on your nose Please remember to go to the x-ray department downstairs for your tests - we will call you with the results when they are available. Ty off the symbicort to see if any worse and if so restart  At 2 pffs every 12 hour schedule ENT  >   [name] rec abx and f/u prn       09/23/2016  f/u ov/Joshua [name] re: uacs vs asthma, was worse off symbicort so restarted and improved  Chief Complaint  Patient presents with  . Follow-up    Cough has completely resovled. He states he is using his albuterol inhaler 1 x every 2 wks on average.   Not limited by breathing from desired activities   rec Plan A = Automatic = symbicort 80 Take 2 puffs first thing in am and then another 2 puffs about 12 hours later.  Work  on inhaler technique:   Plan B = Backup Only use your albuterol as a rescue medication        09/05/2017  f/u ov/Joshua [name] re: chronic asthma/? vcd component  Chief Complaint  Patient presents with  . Follow-up    Has noticed wheezing and chest tightness for the past few wks. He has been taking his symbicort in the am only. He uses his albuterol inhaler no more than once per wk.   Dyspnea:  Better when uses symbicort  Cough: none Sleep: rare chest tightness once a month x 20 min  / better p saba  / No assoc overt hb SABA use: not using when needing symb  rec symbicort increase to 160 Take 2 puffs first thing in am and then another 2 puffs about 12 hours later Work on inhaler technique:     10/03/2017  f/u ov/Joshua [name] re: chronic asthma  Chief Complaint  Patient presents with  . Follow-up    SOB with exertion or long conversations, no wheezing has random gasps for air   Dyspnea:  Not limited by breathing from desired  activities   Cough: no   Sleep: disturbed by back pain  SABA use:  Up to twice daily ? Consistent with symbicort -thinks the 59 works better than the 160    No obvious day to day or daytime variability or assoc excess/ purulent sputum or mucus plugs or hemoptysis or cp or chest tightness, subjective wheeze or overt sinus or hb symptoms. No unusual exposure hx or h/o childhood pna/ asthma or knowledge of premature birth.  Sleeping ok flat without nocturnal  or early am exacerbation  of respiratory  c/o's or need for noct saba. Also denies any obvious fluctuation of symptoms with weather or environmental changes or other aggravating or alleviating factors except as outlined above   Current Allergies, Complete Past Medical History, Past Surgical History, Family History, and Social History were reviewed in [employer] record.  ROS  The following are not active complaints unless bolded Hoarseness, sore throat, dysphagia, dental problems, itching, sneezing,  nasal congestion or discharge of excess mucus or purulent secretions, ear ache,   fever, chills, sweats, unintended wt loss or wt gain, classically pleuritic or exertional cp,  orthopnea pnd or leg swelling, presyncope, palpitations, abdominal pain, anorexia, nausea, vomiting, diarrhea  or change in bowel habits or change in bladder habits, change in stools or change in urine, dysuria, hematuria,  rash, arthralgias, visual complaints, headache, numbness, weakness or ataxia or problems with walking or coordination,  change in mood/affect or memory.        Current Meds  Medication Sig  . albuterol (PROAIR HFA) 108 (90 Base) MCG/ACT inhaler Inhale 1-2 puffs into the lungs every 6 (six) hours as needed for wheezing or shortness of breath.  . alfuzosin (UROXATRAL) 10 MG 24 hr tablet Take 1 tablet by mouth daily.  Marland Kitchen losartan (COZAAR) 100 MG tablet TAKE 1 TABLET (100 MG TOTAL) BY MOUTH DAILY.  . pantoprazole (PROTONIX) 40 MG tablet  Take 1 tablet (40 mg total) by mouth daily.  Joshua [name] BOWEL PREP KIT 17.5-3.13-1.6 GM/177ML SOLN Suprep-Use as directed  .   budesonide-formoterol (SYMBICORT) 160-4.5 MCG/ACT inhaler Inhale 2 puffs into the lungs 2 (two) times daily.                 Objective:   Physical Exam    10/03/2017         204   09/05/2017  204 07/21/16 196 lb (88.9 kg)  06/29/16 197 lb 12.8 oz (89.7 kg)     amb wm nad    Vital signs reviewed - Note on arrival 02 sats  96% on RA     HEENT: nl dentition, turbinates bilaterally, and oropharynx. Nl external ear canals without cough reflex   NECK :  without JVD/Nodes/TM/ nl carotid upstrokes bilaterally   LUNGS: no acc muscle use,  Nl contour chest which is clear to A and P bilaterally without cough on insp or exp maneuvers   CV:  RRR  no s3 or murmur or increase in P2, and no edema   ABD:  soft and nontender with nl inspiratory excursion in the supine position. No bruits or organomegaly appreciated, bowel sounds nl  MS:  Nl gait/ ext warm without deformities, calf tenderness, cyanosis or clubbing No obvious joint restrictions   SKIN: warm and dry without lesions    NEURO:  alert, approp, nl sensorium with  no motor or cerebellar deficits apparent.       CXR PA and Lateral:   10/03/2017 :    I personally reviewed images and agree with radiology impression as follows:    No acute abnormalities: Questioned nodular density at the LEFT base on previous exam resolved.       Assessment:

## 2017-10-04 ENCOUNTER — Encounter: Payer: Self-pay | Admitting: Internal Medicine

## 2017-10-04 NOTE — Assessment & Plan Note (Signed)
06/29/2016    reduce symbicort to 80 2 bid  - FENO 07/21/2016  =   24 on symb 80 2bid - Spirometry 07/21/2016  wnl including  Mid flows p am symbicort > try off > flared so restarted  - 09/05/2017  After extensive coaching inhaler device  effectiveness =    90% > try symb increase to 160   - PFT's  10/03/2017  FEV1 2.57 (88 % ) ratio 67  p 21 % improvement from saba p symb 160 prior to study with DLCO  108 % corrects to 113 % for alv volume  With variable truncation of insp portion of f/v loop and reported better benefit from 80 than 160 strength c/w uacs / vcd component   Will therefore resume the symb 80 2bid and consider adding singulair if flares on symb 80  I had an extended discussion with the patient reviewing all relevant studies completed to date and  lasting 15 to 20 minutes of a 25 minute visit    Each maintenance medication was reviewed in detail including most importantly the difference between maintenance and prns and under what circumstances the prns are to be triggered using an action plan format that is not reflected in the computer generated alphabetically organized AVS.    Please see AVS for specific instructions unique to this visit that I personally wrote and verbalized to the the pt in detail and then reviewed with pt  by my nurse highlighting any  changes in therapy recommended at today's visit to their plan of care.

## 2017-10-04 NOTE — Assessment & Plan Note (Signed)
See cxr 09/05/2017 > f/u one month as this may be artifact and pt is >> 32 y out from smoking so low but not 0 risk > resolved 10/03/2017 / no dedicated f/u needed

## 2017-10-17 ENCOUNTER — Telehealth: Payer: Self-pay | Admitting: Family Medicine

## 2017-10-17 NOTE — Telephone Encounter (Signed)
Copied from [place]. Topic: Quick Communication - Rx Refill/Question >> Oct 17, 2017  4:21 PM Boyd Kerbs wrote:  Medication: losartan (COZAAR) 100 MG tablet  Has the patient contacted their pharmacy? Yes.    (Agent: If no, request that the patient contact the pharmacy for the refill.) Preferred Pharmacy (with phone number or street name):   [id number] [place] [address] Phone: [phone] Fax: [phone]   Agent: Please be advised that RX refills may take up to 3 business days. We ask that you follow-up with your pharmacy.

## 2017-10-18 MED ORDER — LOSARTAN POTASSIUM 100 MG PO TABS: 100.0000 mg | ORAL_TABLET | Freq: Every day | ORAL | 0 refills | Status: DC

## 2017-11-16 ENCOUNTER — Encounter: Payer: Self-pay | Admitting: Gastroenterology

## 2017-11-16 ENCOUNTER — Other Ambulatory Visit: Payer: Self-pay

## 2017-11-16 ENCOUNTER — Other Ambulatory Visit (HOSPITAL_COMMUNITY)
Admission: RE | Admit: 2017-11-16 | Discharge: 2017-11-16 | Disposition: A | Discharge status: Home / Self Care | Payer: Medicare Other | Source: Ambulatory Visit | Attending: Gastroenterology | Admitting: Gastroenterology

## 2017-11-16 ENCOUNTER — Ambulatory Visit (AMBULATORY_SURGERY_CENTER): Payer: Medicare Other | Admitting: Gastroenterology

## 2017-11-16 VITALS — BP 116/76 | HR 77 | Temp 98.0°F | Resp 17 | Ht 68.0 in | Wt 205.0 lb

## 2017-11-16 DIAGNOSIS — B49 Unspecified mycosis: Secondary | ICD-10-CM | POA: Diagnosis not present

## 2017-11-16 DIAGNOSIS — K219 Gastro-esophageal reflux disease without esophagitis: Secondary | ICD-10-CM

## 2017-11-16 DIAGNOSIS — D120 Benign neoplasm of cecum: Secondary | ICD-10-CM | POA: Diagnosis not present

## 2017-11-16 DIAGNOSIS — B379 Candidiasis, unspecified: Secondary | ICD-10-CM | POA: Diagnosis not present

## 2017-11-16 DIAGNOSIS — D122 Benign neoplasm of ascending colon: Secondary | ICD-10-CM

## 2017-11-16 DIAGNOSIS — Z1211 Encounter for screening for malignant neoplasm of colon: Secondary | ICD-10-CM | POA: Diagnosis not present

## 2017-11-16 MED ORDER — DEXTROSE 5 % IV SOLN: INTRAVENOUS | Status: DC

## 2017-11-16 MED ORDER — SODIUM CHLORIDE 0.9 % IV SOLN: 500.0000 mL | Freq: Once | INTRAVENOUS | Status: DC

## 2017-11-16 NOTE — Op Note (Signed)
[facility] Patient Name: Joshua [name] Procedure Date: 11/16/2017 10:56 AM MRN: [id number] Endoscopist: Remo Lipps P. Havery Moros , MD Age: 67 Referring MD:  Date of Birth: December 26, 1950 Gender: Male Account #: [id number] Procedure:                Colonoscopy Indications:              Screening for colorectal malignant neoplasm Medicines:                Monitored Anesthesia Care Procedure:                Pre-Anesthesia Assessment:                           - Prior to the procedure, a History and Physical                            was performed, and patient medications and                            allergies were reviewed. The patient's tolerance of                            previous anesthesia was also reviewed. The risks                            and benefits of the procedure and the sedation                            options and risks were discussed with the patient.                            All questions were answered, and informed consent                            was obtained. Prior Anticoagulants: The patient has                            taken no previous anticoagulant or antiplatelet                            agents. ASA Grade Assessment: II - A patient with                            mild systemic disease. After reviewing the risks                            and benefits, the patient was deemed in                            satisfactory condition to undergo the procedure.                           After obtaining informed consent, the colonoscope  was passed under direct vision. Throughout the                            procedure, the patient's blood pressure, pulse, and                            oxygen saturations were monitored continuously. The                            Colonoscope was introduced through the anus and                            advanced to the the cecum, identified by                            appendiceal orifice and  ileocecal valve. The                            colonoscopy was performed without difficulty. The                            patient tolerated the procedure well. The quality                            of the bowel preparation was good. The ileocecal                            valve, appendiceal orifice, and rectum were                            photographed. Scope In: 11:15:28 AM Scope Out: 11:33:28 AM Scope Withdrawal Time: 0 hours 16 minutes 16 seconds  Total Procedure Duration: 0 hours 18 minutes 0 seconds  Findings:                 The perianal and digital rectal examinations were                            normal.                           A 4 mm polyp was found in the cecum. The polyp was                            sessile. The polyp was removed with a cold snare.                            Resection and retrieval were complete.                           A 7 mm polyp was found in the ascending colon. The                            polyp was sessile. The polyp was removed with a  cold snare. Resection and retrieval were complete.                           Multiple medium-mouthed diverticula were found in                            the transverse colon and left colon.                           Internal hemorrhoids were found during retroflexion.                           The exam was otherwise without abnormality. Complications:            No immediate complications. Estimated blood loss:                            Minimal. Estimated Blood Loss:     Estimated blood loss was minimal. Impression:               - One 4 mm polyp in the cecum, removed with a cold                            snare. Resected and retrieved.                           - One 7 mm polyp in the ascending colon, removed                            with a cold snare. Resected and retrieved.                           - Diverticulosis in the transverse colon and in the                             left colon.                           - Internal hemorrhoids.                           - The examination was otherwise normal. Recommendation:           - Patient has a contact number available for                            emergencies. The signs and symptoms of potential                            delayed complications were discussed with the                            patient. Return to normal activities tomorrow.                            Written discharge instructions were provided to  the                            patient.                           - Resume previous diet.                           - Continue present medications.                           - Await pathology results.                           - Repeat colonoscopy for surveillance based on                            pathology results. Remo Lipps P. [name], MD 11/16/2017 11:43:25 AM This report has been signed electronically.

## 2017-11-16 NOTE — Op Note (Signed)
Joshua [name] Patient Name: Joshua [name] Procedure Date: 11/16/2017 10:57 AM MRN: [id number] Endoscopist: Remo Lipps P. Havery Joshua [name] , MD Age: 67 Referring MD:  Date of Birth: [birth year] Gender: Male Account #: [id number] Procedure:                Upper GI endoscopy Indications:              Follow-up of esophageal reflux Medicines:                Monitored Anesthesia Care Procedure:                Pre-Anesthesia Assessment:                           - Prior to the procedure, a History and Physical                            was performed, and patient medications and                            allergies were reviewed. The patient's tolerance of                            previous anesthesia was also reviewed. The risks                            and benefits of the procedure and the sedation                            options and risks were discussed with the patient.                            All questions were answered, and informed consent                            was obtained. Prior Anticoagulants: The patient has                            taken no previous anticoagulant or antiplatelet                            agents. ASA Grade Assessment: II - A patient with                            mild systemic disease. After reviewing the risks                            and benefits, the patient was deemed in                            satisfactory condition to undergo the procedure.                           After obtaining informed consent, the endoscope was  passed under direct vision. Throughout the                            procedure, the patient's blood pressure, pulse, and                            oxygen saturations were monitored continuously. The                            Endoscope was introduced through the mouth, and                            advanced to the second part of duodenum. The upper                            GI endoscopy was  accomplished without difficulty.                            The patient tolerated the procedure well. Scope In: Scope Out: Findings:                 Esophagogastric landmarks were identified: the                            Z-line was found at 39 cm, the gastroesophageal                            junction was found at 39 cm and the upper extent of                            the gastric folds was found at 40 cm from the                            incisors.                           A 1 cm hiatal hernia was present.                           Multiple diminutive white plaques were found in the                            upper third of the esophagus and in the middle                            third of the esophagus. Brushings for KOH prep were                            obtained in the entire esophagus.                           The exam of the esophagus was otherwise normal. No  evidence of Barrett's esophagus.                           The entire examined stomach was normal.                           Benign small lymphangiectasias were present in the                            examined duodenum.                           The exam of the duodenum was otherwise normal. Complications:            No immediate complications. Estimated blood loss:                            None. Estimated Blood Loss:     Estimated blood loss: none. Impression:               - Esophagogastric landmarks identified.                           - 1 cm hiatal hernia.                           - Multiple plaques in the upper third of the                            esophagus and in the middle third of the esophagus.                            Brushings performed to rule out candidiasis.                           - No evidence of Barrett's esophagus                           - Normal stomach.                           - Duodenal mucosal lymphangiectasia. Recommendation:           - Patient  has a contact number available for                            emergencies. The signs and symptoms of potential                            delayed complications were discussed with the                            patient. Return to normal activities tomorrow.                            Written discharge instructions were provided to the  patient.                           - Resume previous diet.                           - Continue present medications. Remo Lipps P. , MD 11/16/2017 11:51:07 AM This report has been signed electronically.

## 2017-11-16 NOTE — Progress Notes (Signed)
Called to room to assist during endoscopic procedure.  Patient ID and intended procedure confirmed with present staff. Received instructions for my participation in the procedure from the performing physician.  

## 2017-11-16 NOTE — Progress Notes (Signed)
A/ox3 pleased with MAC, report to RN 

## 2017-11-16 NOTE — Progress Notes (Signed)
Pt's states no medical or surgical changes since previsit or office visit. 

## 2017-11-16 NOTE — Patient Instructions (Addendum)
*3 polyps today removed (you will receive a report from Dr Havery Moros within 3 weeks/if you dont call us!)\ *Handout on polyps given. Possible candida in esophagus.  Will await pathology report.    YOU HAD AN ENDOSCOPIC PROCEDURE TODAY AT [facility]:   Refer to the procedure report that was given to you for any specific questions about what was found during the examination.  If the procedure report does not answer your questions, please call your gastroenterologist to clarify.  If you requested that your care partner not be given the details of your procedure findings, then the procedure report has been included in a sealed envelope for you to review at your convenience later.  YOU SHOULD EXPECT: Some feelings of bloating in the abdomen. Passage of more gas than usual.  Walking can help get rid of the air that was put into your GI tract during the procedure and reduce the bloating. If you had a lower endoscopy (such as a colonoscopy or flexible sigmoidoscopy) you may notice spotting of blood in your stool or on the toilet paper. If you underwent a bowel prep for your procedure, you may not have a normal bowel movement for a few days.  Please Note:  You might notice some irritation and congestion in your nose or some drainage.  This is from the oxygen used during your procedure.  There is no need for concern and it should clear up in a day or so.  SYMPTOMS TO REPORT IMMEDIATELY:   Following lower endoscopy (colonoscopy or flexible sigmoidoscopy):  Excessive amounts of blood in the stool  Significant tenderness or worsening of abdominal pains  Swelling of the abdomen that is new, acute  Fever of 100F or higher   Following upper endoscopy (EGD)  Vomiting of blood or coffee ground material  New chest pain or pain under the shoulder blades  Painful or persistently difficult swallowing  New shortness of breath  Fever of 100F or higher  Black, tarry-looking stools  For  urgent or emergent issues, a gastroenterologist can be reached at any hour by calling [phone].   DIET:  We do recommend a small meal at first, but then you may proceed to your regular diet.  Drink plenty of fluids but you should avoid alcoholic beverages for 24 hours.  ACTIVITY:  You should plan to take it easy for the rest of today and you should NOT DRIVE or use heavy machinery until tomorrow (because of the sedation medicines used during the test).    FOLLOW UP: Our staff will call the number listed on your records the next business day following your procedure to check on you and address any questions or concerns that you may have regarding the information given to you following your procedure. If we do not reach you, we will leave a message.  However, if you are feeling well and you are not experiencing any problems, there is no need to return our call.  We will assume that you have returned to your regular daily activities without incident.  If any biopsies were taken you will be contacted by phone or by letter within the next 1-3 weeks.  Please call us at [phone] if you have not heard about the biopsies in 3 weeks.    SIGNATURES/CONFIDENTIALITY: You and/or your care partner have signed paperwork which will be entered into your electronic medical record.  These signatures attest to the fact that that the information above on your After Visit  Summary has been reviewed and is understood.  Full responsibility of the confidentiality of this discharge information lies with you and/or your care-partner. 

## 2017-11-17 ENCOUNTER — Telehealth: Payer: Self-pay | Admitting: *Deleted

## 2017-11-17 NOTE — Telephone Encounter (Signed)
First call attempt, message left on voicemail

## 2017-11-17 NOTE — Telephone Encounter (Signed)
  Follow up Call-  Call back number 11/16/2017  Post procedure Call Back phone  # [phone]  Permission to leave phone message Yes  Some recent data might be hidden     Patient questions:  Do you have a fever, pain , or abdominal swelling? No. Pain Score  0 *  Have you tolerated food without any problems? Yes.    Have you been able to return to your normal activities? Yes.    Do you have any questions about your discharge instructions: Diet   No. Medications  No. Follow up visit  No.  Do you have questions or concerns about your Care? No.  Actions: * If pain score is 4 or above: No action needed, pain <4.

## 2017-11-23 ENCOUNTER — Other Ambulatory Visit: Payer: Self-pay

## 2017-11-23 MED ORDER — FLUCONAZOLE 200 MG PO TABS: 200.0000 mg | ORAL_TABLET | ORAL | 0 refills | Status: DC

## 2017-11-25 ENCOUNTER — Encounter: Payer: Self-pay | Admitting: Gastroenterology

## 2017-11-30 ENCOUNTER — Ambulatory Visit: Payer: Medicare Other | Admitting: Family Medicine

## 2017-12-01 ENCOUNTER — Ambulatory Visit (INDEPENDENT_AMBULATORY_CARE_PROVIDER_SITE_OTHER): Payer: Medicare Other | Admitting: Family Medicine

## 2017-12-01 ENCOUNTER — Encounter: Payer: Self-pay | Admitting: Family Medicine

## 2017-12-01 VITALS — BP 128/92 | HR 95 | Resp 16 | Ht 68.0 in | Wt 199.8 lb

## 2017-12-01 DIAGNOSIS — Z1159 Encounter for screening for other viral diseases: Secondary | ICD-10-CM

## 2017-12-01 DIAGNOSIS — Z13220 Encounter for screening for lipoid disorders: Secondary | ICD-10-CM

## 2017-12-01 DIAGNOSIS — Z130 Encounter for screening for diseases of the blood and blood-forming organs and certain disorders involving the immune mechanism: Secondary | ICD-10-CM | POA: Diagnosis not present

## 2017-12-01 DIAGNOSIS — I10 Essential (primary) hypertension: Secondary | ICD-10-CM

## 2017-12-01 DIAGNOSIS — Z131 Encounter for screening for diabetes mellitus: Secondary | ICD-10-CM | POA: Diagnosis not present

## 2017-12-01 DIAGNOSIS — K635 Polyp of colon: Secondary | ICD-10-CM

## 2017-12-01 MED ORDER — LOSARTAN POTASSIUM 100 MG PO TABS: 100.0000 mg | ORAL_TABLET | Freq: Every day | ORAL | 3 refills | Status: DC

## 2017-12-01 NOTE — Progress Notes (Signed)
[place] at [facility] [address], [place], [place] [address] [phone]  Date:  12/01/2017   Name:  Joshua [name]   DOB:  1951/06/17   MRN:  [id number]  PCP:  Darreld Mclean, MD    Chief Complaint: Hypertension (losartan recall, wanting medication changed) and Medication   History of Present Illness:  Joshua [name] is a 67 y.o. very pleasant male patient who presents with the following:  Following up on his medications today -  He was concerned about his losartan due to recall from a few months ago - however reassured that this issue is resolved.  He is not contacted about his particular rx so nothing of concern.  He feels reassured and is ok with continuing to take his med Also discussed doing labs and a prevnar.  He is about to go to [place] to babysit his grand-daughter for a couple of weeks and prefers to wait until he returns which is fine He is otherwise feeling fine and has no other concerns today  BP Readings from Last 3 Encounters:  12/01/17 (!) 128/92  11/16/17 116/76  10/03/17 138/82   Recent colonoscopy; he reports that he had 3 polyps removed but was not sure of the results/ follow-up as of yet I was able to see his polyp report- it appears that they are benign, I would think he will need maybe a 5 year follow-up scope Called pt and advised him of this- [facility]   Patient Active Problem List   Diagnosis Date Noted  . Solitary lung nodule 09/07/2017  . Sinusitis, chronic/Cough 07/22/2016  . Chronic asthma, mild persistent, uncomplicated 29/52/8413  . Upper airway cough syndrome 06/29/2016    Past Medical History:  Diagnosis Date  . Asthma   . BPH (benign prostatic hyperplasia)   . GERD (gastroesophageal reflux disease)   . HLD (hyperlipidemia)   . HSV infection   . Hypertension   . Kidney stones     Past Surgical History:  Procedure Laterality Date  . APPENDECTOMY    . BREAST CYST EXCISION Left    benign   . LITHOTRIPSY    . TONSILLECTOMY      Social History   Tobacco Use  . Smoking status: Former Smoker    Packs/day: 0.50    Years: 22.00    Pack years: 11.00    Types: Cigarettes    Last attempt to quit: 06/28/1993    Years since quitting: 24.4  . Smokeless tobacco: Never Used  Substance Use Topics  . Alcohol use: Yes    Comment: occasionally  . Drug use: No    Family History  Problem Relation Age of Onset  . Alzheimer's disease Father   . Kidney Stones Mother   . Colon cancer Paternal Grandfather 52    No Known Allergies  Medication list has been reviewed and updated.  Current Outpatient Medications on File Prior to Visit  Medication Sig Dispense Refill  . albuterol (PROAIR HFA) 108 (90 Base) MCG/ACT inhaler Inhale 1-2 puffs into the lungs every 6 (six) hours as needed for wheezing or shortness of breath. 1 each 2  . alfuzosin (UROXATRAL) 10 MG 24 hr tablet Take 1 tablet by mouth daily.    . budesonide-formoterol (SYMBICORT) 80-4.5 MCG/ACT inhaler Take 2 puffs first thing in am and then another 2 puffs about 12 hours later. 1 Inhaler 11  . fluconazole (DIFLUCAN) 200 MG tablet Take 1 tablet (200 mg total)  by mouth as directed. Take 2 tablets (400 mg) on first day, then 1 tablet (200 mg) daily until gone. 15 tablet 0  . pantoprazole (PROTONIX) 40 MG tablet Take 1 tablet (40 mg total) by mouth daily. 90 tablet 3   No current facility-administered medications on file prior to visit.     Review of Systems:  As per HPI- otherwise negative. No fever or chills No CP or SOB No Gi symptoms No rash    Physical Examination: Vitals:   12/01/17 1318  BP: (!) 128/92  Pulse: 95  Resp: 16  SpO2: 97%   Vitals:   12/01/17 1318  Weight: 199 lb 12.8 oz (90.6 kg)  Height: 5\' 8"  (1.727 m)   Body mass index is 30.38 kg/m. Ideal Body Weight: Weight in (lb) to have BMI = 25: 164.1 HEENT: Atraumatic, Normocephalic. Neck supple. No masses, No LAD.  Bilateral TM wnl, oropharynx  normal.  GEN: WDWN, NAD, Non-toxic, A & O x 3, looks well, overweight   PEERL,EOMI.   Ears and Nose: No external deformity. CV: RRR, No M/G/R. No JVD. No thrill. No extra heart sounds. PULM: CTA B, no wheezes, crackles, rhonchi. No retractions. No resp. distress. No accessory muscle use. ABD: S, NT, ND EXTR: No c/c/e NEURO Normal gait.  PSYCH: Normally interactive. Conversant. Not depressed or anxious appearing.  Calm demeanor.    Assessment and Plan: Essential hypertension - Plan: losartan (COZAAR) 100 MG tablet, CBC  Screening for hyperlipidemia - Plan: Lipid panel, CANCELED: Lipid panel  Screening for diabetes mellitus - Plan: Comprehensive metabolic panel  Screening for deficiency anemia - Plan: CBC  Encounter for hepatitis C screening test for low risk patient - Plan: Hepatitis C antibody  Polyp of colon, unspecified part of colon, unspecified type  BP is under ok control- continue current losartan dose Ordered labs for him to have drawn when he returns home He will get prevnar 13 as a nurse visit when he returns home Will plan further follow- up pending labs.   Signed Lamar Blinks, MD

## 2017-12-01 NOTE — Patient Instructions (Signed)
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   I ordered labs for you to have done when you get back in town- please come fasting Also, you need to have a Prevnar 13 pneumonia shot-we can do this here as a nurse visit only if you like Ok to continue your current BP med

## 2017-12-02 ENCOUNTER — Telehealth: Payer: Self-pay | Admitting: Family Medicine

## 2017-12-02 DIAGNOSIS — Z125 Encounter for screening for malignant neoplasm of prostate: Secondary | ICD-10-CM

## 2017-12-02 NOTE — Telephone Encounter (Signed)
Copied from [place] [phone]. Topic: Quick Communication - See Telephone Encounter >> Dec 02, 2017 11:02 AM Ether Griffins B wrote: CRM for notification. See Telephone encounter for: 12/02/17.  Pt is requesting PSA lab be checked with his other labs on 12/21/17

## 2017-12-21 ENCOUNTER — Other Ambulatory Visit: Payer: Medicare Other

## 2017-12-21 ENCOUNTER — Ambulatory Visit: Payer: Medicare Other

## 2017-12-22 ENCOUNTER — Encounter: Payer: Self-pay | Admitting: Family Medicine

## 2017-12-22 ENCOUNTER — Other Ambulatory Visit (INDEPENDENT_AMBULATORY_CARE_PROVIDER_SITE_OTHER): Payer: Medicare Other

## 2017-12-22 DIAGNOSIS — Z1159 Encounter for screening for other viral diseases: Secondary | ICD-10-CM | POA: Diagnosis not present

## 2017-12-22 DIAGNOSIS — I10 Essential (primary) hypertension: Secondary | ICD-10-CM

## 2017-12-22 DIAGNOSIS — Z130 Encounter for screening for diseases of the blood and blood-forming organs and certain disorders involving the immune mechanism: Secondary | ICD-10-CM

## 2017-12-22 DIAGNOSIS — Z131 Encounter for screening for diabetes mellitus: Secondary | ICD-10-CM | POA: Diagnosis not present

## 2017-12-22 DIAGNOSIS — Z13220 Encounter for screening for lipoid disorders: Secondary | ICD-10-CM | POA: Diagnosis not present

## 2017-12-22 LAB — COMPREHENSIVE METABOLIC PANEL
ALT: 17 U/L (ref 0–53)
AST: 16 U/L (ref 0–37)
Albumin: 3.9 g/dL (ref 3.5–5.2)
Alkaline Phosphatase: 60 U/L (ref 39–117)
BILIRUBIN TOTAL: 0.6 mg/dL (ref 0.2–1.2)
BUN: 17 mg/dL (ref 6–23)
CO2: 29 meq/L (ref 19–32)
Calcium: 8.8 mg/dL (ref 8.4–10.5)
Chloride: 104 mEq/L (ref 96–112)
Creatinine, Ser: 1.07 mg/dL (ref 0.40–1.50)
GFR: 73.49 mL/min (ref >60.00)
GLUCOSE: 101 mg/dL — AB (ref 70–99)
Potassium: 4.1 mEq/L (ref 3.5–5.1)
SODIUM: 138 meq/L (ref 135–145)
Total Protein: 6.3 g/dL (ref 6.0–8.3)

## 2017-12-22 LAB — CBC
HCT: 43.2 % (ref 39.0–52.0)
Hemoglobin: 14.7 g/dL (ref 13.0–17.0)
MCHC: 34.1 g/dL (ref 30.0–36.0)
MCV: 80.7 fl (ref 78.0–100.0)
Platelets: 243 10*3/uL (ref 150.0–400.0)
RBC: 5.36 Mil/uL (ref 4.22–5.81)
RDW: 13.6 % (ref 11.5–15.5)
WBC: 6.4 10*3/uL (ref 4.0–10.5)

## 2017-12-22 LAB — LIPID PANEL
CHOL/HDL RATIO: 4
Cholesterol: 203 mg/dL — ABNORMAL HIGH (ref 0–200)
HDL: 54.2 mg/dL (ref >39.00)
LDL Cholesterol: 129 mg/dL — ABNORMAL HIGH (ref 0–99)
NONHDL: 148.49
Triglycerides: 98 mg/dL (ref 0.0–149.0)
VLDL: 19.6 mg/dL (ref 0.0–40.0)

## 2017-12-23 ENCOUNTER — Ambulatory Visit (INDEPENDENT_AMBULATORY_CARE_PROVIDER_SITE_OTHER): Payer: Medicare Other

## 2017-12-23 DIAGNOSIS — Z23 Encounter for immunization: Secondary | ICD-10-CM | POA: Diagnosis not present

## 2017-12-23 LAB — HEPATITIS C ANTIBODY
Hepatitis C Ab: NONREACTIVE
SIGNAL TO CUT-OFF: 0.02 (ref <1.00)

## 2017-12-23 NOTE — Progress Notes (Signed)
Pre visit review using our clinic review tool, if applicable. No additional management support is needed unless otherwise documented below in the visit note.  Patient here for Prevnar 13 vaccine per PCP. Vacine given in left deltoid IM. Tolerated well. VIS sheet given.

## 2017-12-24 DIAGNOSIS — M79641 Pain in right hand: Secondary | ICD-10-CM | POA: Diagnosis not present

## 2017-12-24 DIAGNOSIS — L299 Pruritus, unspecified: Secondary | ICD-10-CM | POA: Diagnosis not present

## 2017-12-24 DIAGNOSIS — Y998 Other external cause status: Secondary | ICD-10-CM | POA: Diagnosis not present

## 2017-12-24 DIAGNOSIS — R239 Unspecified skin changes: Secondary | ICD-10-CM | POA: Diagnosis not present

## 2017-12-24 DIAGNOSIS — M25441 Effusion, right hand: Secondary | ICD-10-CM | POA: Diagnosis not present

## 2017-12-24 DIAGNOSIS — W57XXXA Bitten or stung by nonvenomous insect and other nonvenomous arthropods, initial encounter: Secondary | ICD-10-CM | POA: Diagnosis not present

## 2017-12-24 DIAGNOSIS — S60561A Insect bite (nonvenomous) of right hand, initial encounter: Secondary | ICD-10-CM | POA: Diagnosis not present

## 2018-01-02 ENCOUNTER — Ambulatory Visit: Payer: Medicare Other | Admitting: Internal Medicine

## 2018-01-02 ENCOUNTER — Telehealth: Payer: Self-pay

## 2018-01-02 NOTE — Telephone Encounter (Signed)
Copied from [place] [phone]. Topic: Quick Communication - Lab Results >> Jan 02, 2018 12:55 PM Synthia Innocent wrote: Requesting PSA results

## 2018-01-02 NOTE — Telephone Encounter (Signed)
Called pt back and LMOM- I can see the order for PSA, I am not sure why it was not done and apologize.  However, his last PSA was done in 10/18, so ok to wait and do PSA with next routine labs if he likes.  If he wants to come in specially and have PSA drawn now that is also ok

## 2018-01-02 NOTE — Telephone Encounter (Signed)
Patient requesting results of PSA. Letter sent to patient on 12/26/17 with lab results but PSA was not resulted. Please advise?

## 2018-01-17 DIAGNOSIS — I10 Essential (primary) hypertension: Secondary | ICD-10-CM | POA: Insufficient documentation

## 2018-01-17 NOTE — Progress Notes (Addendum)
[place] at [facility] [address] [place], [place] [address] [phone]  Date:  01/18/2018   Name:  Joshua [name]   DOB:  [birth year]   MRN:  [id number]  PCP:  Darreld Mclean, MD    Chief Complaint: Rash (on bilateral arms and neck area, gotten better, working in the yard-rash after touching bushes, itching, redness)   History of Present Illness:  Joshua [name] is a 67 y.o. very pleasant male patient who presents with the following:  Here today with concern of a rash History of asthma, HTN 2 days ago he was working in the yard and got a rash on his arms- developed right after he was working outdoors and felt itchy all over. It has gotten quite a bit better and is about resolved at this time Itching has resolved  He is otherwise feeling overall well  He has noted night sweats for about 6 months now They occur twice a week No fever or weight loss   I last saw him in June We did some labs for him recently as well which looked fine.  A PSA was not drawn for some reason- he would like to do this today  He was concerned about his losartan due to recall from a few months ago - however reassured that this issue is resolved. He is not contacted about his particular rx so nothing of concern.  He feels reassured and is ok with continuing to take his med Also discussed doing labs and a prevnar.  He is about to go to [place] to babysit his grand-daughter for a couple of weeks and prefers to wait until he returns which is fine He is otherwise feeling fine and has no other concerns today  Tetanus due- he has an abrasion of his left arm from yard work that will cover this shot  Wt Readings from Last 3 Encounters:  01/18/18 204 lb (92.5 kg)  12/01/17 199 lb 12.8 oz (90.6 kg)  11/16/17 205 lb (93 kg)    Patient Active Problem List   Diagnosis Date Noted  . Essential hypertension 01/17/2018  . Solitary lung nodule 09/07/2017  . Sinusitis,  chronic/Cough 07/22/2016  . Chronic asthma, mild persistent, uncomplicated 34/91/7915  . Upper airway cough syndrome 06/29/2016    Past Medical History:  Diagnosis Date  . Asthma   . BPH (benign prostatic hyperplasia)   . GERD (gastroesophageal reflux disease)   . HLD (hyperlipidemia)   . HSV infection   . Hypertension   . Kidney stones     Past Surgical History:  Procedure Laterality Date  . APPENDECTOMY    . BREAST CYST EXCISION Left    benign  . LITHOTRIPSY    . TONSILLECTOMY      Social History   Tobacco Use  . Smoking status: Former Smoker    Packs/day: 0.50    Years: 22.00    Pack years: 11.00    Types: Cigarettes    Last attempt to quit: 06/28/1993    Years since quitting: 24.5  . Smokeless tobacco: Never Used  Substance Use Topics  . Alcohol use: Yes    Comment: occasionally  . Drug use: No    Family History  Problem Relation Age of Onset  . Alzheimer's disease Father   . Kidney Stones Mother   . Colon cancer Paternal Grandfather 62    No Known Allergies  Medication list has been reviewed and updated.  Current Outpatient Medications on File Prior to Visit  Medication Sig Dispense Refill  . albuterol (PROAIR HFA) 108 (90 Base) MCG/ACT inhaler Inhale 1-2 puffs into the lungs every 6 (six) hours as needed for wheezing or shortness of breath. 1 each 2  . alfuzosin (UROXATRAL) 10 MG 24 hr tablet Take 1 tablet by mouth daily.    . budesonide-formoterol (SYMBICORT) 80-4.5 MCG/ACT inhaler Take 2 puffs first thing in am and then another 2 puffs about 12 hours later. 1 Inhaler 11  . losartan (COZAAR) 100 MG tablet Take 1 tablet (100 mg total) by mouth daily. 90 tablet 3  . pantoprazole (PROTONIX) 40 MG tablet Take 1 tablet (40 mg total) by mouth daily. 90 tablet 3   No current facility-administered medications on file prior to visit.     Review of Systems:  As per HPI- otherwise negative.   Physical Examination: Vitals:   01/18/18 1135  BP: (!)  146/94  Pulse: 82  Resp: 16  SpO2: 98%   Vitals:   01/18/18 1135  Weight: 204 lb (92.5 kg)  Height: 5\' 8"  (1.727 m)   Body mass index is 31.02 kg/m. Ideal Body Weight: Weight in (lb) to have BMI = 25: 164.1  GEN: WDWN, NAD, Non-toxic, A & O x 3, looks well, overweight HEENT: Atraumatic, Normocephalic. Neck supple. No masses, No LAD.  Bilateral TM wnl, oropharynx normal.  PEERL,EOMI.  No rash at this time Abrasion/ scratch on left arm  Ears and Nose: No external deformity. CV: RRR, No M/G/R. No JVD. No thrill. No extra heart sounds. PULM: CTA B, no wheezes, crackles, rhonchi. No retractions. No resp. distress. No accessory muscle use. ABD: S, NT, ND, +BS. No rebound. No HSM. EXTR: No c/c/e NEURO Normal gait.  PSYCH: Normally interactive. Conversant. Not depressed or anxious appearing.  Calm demeanor.    Assessment and Plan: Open wound of left forearm, initial encounter - Plan: Tdap vaccine greater than or equal to 7yo IM  Essential hypertension  Immunization due - Plan: Tdap vaccine greater than or equal to 7yo IM  Night sweats - Plan: HIV antibody, DG Chest 2 View  Screening for prostate cancer - Plan: PSA, Medicare  HSV infection - Plan: HIV antibody  Screening for human immunodeficiency virus - Plan: HIV antibody  Following up today Rash has resolved.  He will let me know if this comes back He has noted night sweats. Recent complete blood work normal Will obtain HIV and CXR today, check PSA   Ordered CXR, await BW and will be in touch with him  He has not noted any cough or fever  Dg Chest 2 View  Result Date: 01/18/2018 CLINICAL DATA:  Approximate 6 month history of night sweats. Current history of hypertension and asthma. EXAM: CHEST - 2 VIEW COMPARISON:  10/03/2017, 09/05/2017, 07/21/2016. FINDINGS: Suboptimal inspiration. Cardiomediastinal silhouette unremarkable, unchanged. Linear atelectasis involving the LEFT LOWER LOBE. Lungs otherwise clear.  Bronchovascular markings normal. No localized airspace consolidation. No pleural effusions. No pneumothorax. Normal pulmonary vascularity. Degenerative changes involving the thoracic spine. IMPRESSION: Linear atelectasis involving the LEFT LOWER LOBE. No acute cardiopulmonary disease otherwise. Electronically Signed   By: Evangeline Dakin M.D.   On: 01/18/2018 12:47     Signed Lamar Blinks, MD  Received his labs- letter to pt  Results for orders placed or performed in visit on 01/18/18  HIV antibody  Result Value Ref Range   HIV 1&2 Ab, 4th Generation NON-REACTIVE NON-REACTI  PSA, Medicare  Result Value  Ref Range   PSA 0.99 0.10 - 4.00 ng/ml

## 2018-01-18 ENCOUNTER — Ambulatory Visit (INDEPENDENT_AMBULATORY_CARE_PROVIDER_SITE_OTHER): Payer: Medicare Other | Admitting: Family Medicine

## 2018-01-18 ENCOUNTER — Encounter: Payer: Self-pay | Admitting: Family Medicine

## 2018-01-18 ENCOUNTER — Ambulatory Visit (HOSPITAL_BASED_OUTPATIENT_CLINIC_OR_DEPARTMENT_OTHER)
Admission: RE | Admit: 2018-01-18 | Discharge: 2018-01-18 | Disposition: A | Discharge status: Home / Self Care | Payer: Medicare Other | Source: Ambulatory Visit | Attending: Family Medicine | Admitting: Family Medicine

## 2018-01-18 VITALS — BP 146/94 | HR 82 | Resp 16 | Ht 68.0 in | Wt 204.0 lb

## 2018-01-18 DIAGNOSIS — R61 Generalized hyperhidrosis: Secondary | ICD-10-CM

## 2018-01-18 DIAGNOSIS — S51802A Unspecified open wound of left forearm, initial encounter: Secondary | ICD-10-CM | POA: Diagnosis not present

## 2018-01-18 DIAGNOSIS — Z125 Encounter for screening for malignant neoplasm of prostate: Secondary | ICD-10-CM

## 2018-01-18 DIAGNOSIS — J9811 Atelectasis: Secondary | ICD-10-CM | POA: Insufficient documentation

## 2018-01-18 DIAGNOSIS — Z23 Encounter for immunization: Secondary | ICD-10-CM

## 2018-01-18 DIAGNOSIS — Z114 Encounter for screening for human immunodeficiency virus [HIV]: Secondary | ICD-10-CM

## 2018-01-18 DIAGNOSIS — I10 Essential (primary) hypertension: Secondary | ICD-10-CM | POA: Diagnosis not present

## 2018-01-18 DIAGNOSIS — B009 Herpesviral infection, unspecified: Secondary | ICD-10-CM | POA: Diagnosis not present

## 2018-01-18 LAB — PSA, MEDICARE: PSA: 0.99 ng/ml (ref 0.10–4.00)

## 2018-01-18 NOTE — Patient Instructions (Addendum)
Good to see you today!  Stop by the lab for a blood draw, and then go to imaging on the ground floor for your chest x-ray.  We will look for any cause of your night sweats!   Let me know if your rash returns You got your tetanus booster today

## 2018-01-19 LAB — HIV ANTIBODY (ROUTINE TESTING W REFLEX): HIV: NONREACTIVE

## 2018-02-06 ENCOUNTER — Telehealth: Payer: Self-pay

## 2018-02-06 DIAGNOSIS — N401 Enlarged prostate with lower urinary tract symptoms: Secondary | ICD-10-CM

## 2018-02-06 MED ORDER — ALFUZOSIN HCL ER 10 MG PO TB24: 10.0000 mg | ORAL_TABLET | Freq: Every day | ORAL | 3 refills | Status: AC

## 2018-02-06 NOTE — Addendum Note (Signed)
Addended by: Lamar Blinks C on: 02/06/2018 05:06 PM   Modules accepted: Orders

## 2018-02-06 NOTE — Telephone Encounter (Signed)
Copied from [place] [id number]. Topic: General - Other >> Feb 06, 2018  9:24 AM Carolyn Stare wrote:  Pt ask if Dr Lorelei Pont will fill the below med. I tried to give him an appt since she is not the one who filled the medication but he refused   alfuzosin (UROXATRAL) 10 MG 24 hr tablet

## 2018-02-28 ENCOUNTER — Ambulatory Visit (INDEPENDENT_AMBULATORY_CARE_PROVIDER_SITE_OTHER): Payer: Medicare Other | Admitting: Internal Medicine

## 2018-02-28 ENCOUNTER — Encounter: Payer: Self-pay | Admitting: Internal Medicine

## 2018-02-28 VITALS — BP 142/80 | HR 101 | Ht 67.75 in | Wt 203.0 lb

## 2018-02-28 DIAGNOSIS — J4530 Mild persistent asthma, uncomplicated: Secondary | ICD-10-CM | POA: Diagnosis not present

## 2018-02-28 NOTE — Progress Notes (Signed)
Subjective:     Patient ID: Joshua [name], male   DOB: Feb 07, 1951,    MRN: [id number]    Brief patient profile:  67 yo [place] male  with bad bronchitis around 1993 and quit smoking and then  all better until around 2000 while living in [place] with intermittent "wheezing" dx as asthma > never resolved  Self referred for dtc asthma   History of Present Illness  06/29/2016 1st [facility] office visit/ Irais Mottram   Chief Complaint  Patient presents with  . Pulmonary Consult    moved from [place], chronic bronchitis, wheezing, dx with asthma in 2000, cold humidity makes it worse  maint on symb 160 2bid and at baseline intermittently noisy breathing one breath q 2-3 days  s pattern though not waking him  noct   Worse since  Jun 22 2016  with uri/ sinus symptoms >  [facility] rx  Amox/only a little better, still severe nasal congestion/severe fits of day > noct cough and subj wheeze.  Typical triggers are uri/not seasonal pattern rhintiis - happens up to 4 x yearly with or without symbicort and just as severe. rec Augmentin 875 mg take one pill twice daily  X 10 days   Please see patient coordinator before you leave today  to schedule CT sinus  in 10 days - no sooner Use nasal saline as much as possible to help moisturize your passages  Continue protonix but take it Take 30-60 min before first meal of the day  Prednisone 10 mg take  4 each am x 2 days,   2 each am x 2 days,  1 each am x 2 days and stop  Work on inhaler technique:  Plan A = Automatic = symbicort 80 Take 2 puffs first thing in am and then another 2 puffs about 12 hours later.  Plan B = Backup Only use your albuterol (Proir) as a rescue medication to be used if you can't catch your breath by resting or doing a relaxed purse lip breathing pattern.  - The less you use it, the better it will work when you need it. - Ok to use the inhaler up to 2 puffs  every 4 hours     07/21/2016  f/u ov/Owen Pratte re: uacs vs asthma Chief Complaint  Patient  presents with  . Follow-up    Cough is much improved, but has not completely resolved. Breathing has improved.   cough is worse at bedtime and in am p rising but not waking up prematurely and not productive  Bloody nasal d/c / no sob at all  rec Use lots of saline nasal spray on your nose Please remember to go to the x-ray department downstairs for your tests - we will call you with the results when they are available. Ty off the symbicort to see if any worse and if so restart  At 2 pffs every 12 hour schedule ENT  >   [name] rec abx and f/u prn      09/05/2017  f/u ov/Trenity Pha re: chronic asthma/? vcd component  Chief Complaint  Patient presents with  . Follow-up    Has noticed wheezing and chest tightness for the past few wks. He has been taking his symbicort in the am only. He uses his albuterol inhaler no more than once per wk.   Dyspnea:  Better when uses symbicort  Cough: none Sleep: rare chest tightness once a month x 20 min  / better p saba  / No  assoc overt hb SABA use: not using when needing symb  rec symbicort increase to 160 Take 2 puffs first thing in am and then another 2 puffs about 12 hours later Work on inhaler technique:     10/03/2017  f/u ov/Madelynne Lasker re: chronic asthma  Chief Complaint  Patient presents with  . Follow-up    SOB with exertion or long conversations, no wheezing has random gasps for air   Dyspnea:  Not limited by breathing from desired activities   Cough: no   Sleep: disturbed by back pain  SABA use:  Up to twice daily ? Consistent with symbicort -thinks the 39 works better than the 160  rec Change back to symbicort 80 Take 2 puffs first thing in am and then another 2 puffs about 12 hours later.  Only use your albuterol as a rescue medication Please remember to go to the  x-ray department downstairs in the basement  for your tests - we will call you with the results when they are available. Please schedule a follow up visit in 3 months but call  sooner if needed  - late add consider singulair add next       02/28/2018  f/u ov/Ashyia Schraeder re: chronic asthma  Chief Complaint  Patient presents with  . Follow-up    Breathing is "ok". He states rarely uses his albuterol, but did have to use approx 10 days ago due to chest tightness. He does have slight tightness in his chest today.   Dyspnea:  MMRC1 = can walk nl pace, flat grade, can't hurry or go uphills or steps s sob   Cough: no Sleeping: on side or prone, bed flat SABA use: a few times a week at most 02: none  Chest Pain since asthma flare  always circumferential and equal on both sides x decades and most it ever last 5 min never when lying down, last episode 3 weeks prior to OV - never supine    No obvious day to day or daytime variability or assoc excess/ purulent sputum or mucus plugs or hemoptysis or cp or chest tightness, subjective wheeze or overt sinus or hb symptoms.   Sleeping as above without nocturnal  or early am exacerbation  of respiratory  c/o's or need for noct saba. Also denies any obvious fluctuation of symptoms with weather or environmental changes or other aggravating or alleviating factors except as outlined above   No unusual exposure hx or h/o childhood pna/ asthma or knowledge of premature birth.  Current Allergies, Complete Past Medical History, Past Surgical History, Family History, and Social History were reviewed in [employer] record.  ROS  The following are not active complaints unless bolded Hoarseness, sore throat, dysphagia, dental problems, itching, sneezing,  nasal congestion or discharge of excess mucus or purulent secretions, ear ache,   fever, chills, sweats, unintended wt loss or wt gain, classically pleuritic or exertional cp,  orthopnea pnd or arm/hand swelling  or leg swelling, presyncope, palpitations, abdominal pain, anorexia, nausea, vomiting, diarrhea  or change in bowel habits or change in bladder habits, change in  stools or change in urine, dysuria, hematuria,  rash, arthralgias, visual complaints, headache, numbness, weakness or ataxia or problems with walking or coordination,  change in mood or  memory.        Current Meds  Medication Sig  . albuterol (PROAIR HFA) 108 (90 Base) MCG/ACT inhaler Inhale 1-2 puffs into the lungs every 6 (six) hours as needed for wheezing or  shortness of breath.  . alfuzosin (UROXATRAL) 10 MG 24 hr tablet Take 1 tablet (10 mg total) by mouth daily.  . budesonide-formoterol (SYMBICORT) 80-4.5 MCG/ACT inhaler Take 2 puffs first thing in am and then another 2 puffs about 12 hours later.  Marland Kitchen losartan (COZAAR) 100 MG tablet Take 1 tablet (100 mg total) by mouth daily.  . pantoprazole (PROTONIX) 40 MG tablet Take 1 tablet (40 mg total) by mouth daily.             Objective:   Physical Exam  02/28/2018          203   10/03/2017         204   09/05/2017       204 07/21/16 196 lb (88.9 kg)  06/29/16 197 lb 12.8 oz (89.7 kg)     amb wm nad   Vital signs reviewed - Note on arrival 02 sats  96% on RA       HEENT: nl  turbinates bilaterally, and oropharynx. Nl external ear canals without cough reflex - top dentures    NECK :  without JVD/Nodes/TM/ nl carotid upstrokes bilaterally   LUNGS: no acc muscle use,  Nl contour chest which is clear to A and P bilaterally without cough on insp or exp maneuvers   CV:  RRR  no s3 or murmur or increase in P2, and no edema   ABD:  soft and nontender with nl inspiratory excursion in the supine position. No bruits or organomegaly appreciated, bowel sounds nl  MS:  Nl gait/ ext warm without deformities, calf tenderness, cyanosis or clubbing No obvious joint restrictions   SKIN: warm and dry without lesions    NEURO:  alert, approp, nl sensorium with  no motor or cerebellar deficits apparent.        I personally reviewed images and agree with radiology impression as follows:  CXR:   01/18/18 Linear atelectasis involving the  LEFT LOWER LOBE. No acute cardiopulmonary disease otherwise. My review:  No significant atx          Assessment:

## 2018-02-28 NOTE — Patient Instructions (Signed)
Work on inhaler technique:  relax and gently blow all the way out then take a nice smooth deep breath back in, triggering the inhaler at same time you start breathing in.  Hold for up to 5 seconds if you can. Blow out thru nose. Rinse and gargle with water when done      No change in medications   Please schedule a follow up visit in 3 months but call sooner if needed

## 2018-03-01 ENCOUNTER — Encounter: Payer: Self-pay | Admitting: Internal Medicine

## 2018-03-01 NOTE — Assessment & Plan Note (Addendum)
06/29/2016    reduce symbicort to 80 2 bid  - FENO 07/21/2016  =   24 on symb 80 2bid - Spirometry 07/21/2016  wnl including  Mid flows p am symbicort > try off > flared so restarted  - 09/05/2017    try symb increase to 160  - PFT's  10/03/2017  FEV1 2.57 (88 % ) ratio 67  p 21 % improvement from saba p symb 160 prior to study with DLCO  108 % corrects to 113 % for alv volume  With variable truncation of insp portion of f/v loop and reported better benefit from 80 than 160 strength c/w uacs / vcd component    - The proper method of use, as well as anticipated side effects, of a metered-dose inhaler are discussed and demonstrated to the patient.    Overall doing well on symbicort and doubt the chest tightness is related to asthma or cardiac based on chronicity but more likely ibs as never experiences it in supine position > advised   All goals of chronic asthma control met including optimal function and elimination of symptoms with minimal need for rescue therapy.  Contingencies discussed in full including contacting this office immediately if not controlling the symptoms using the rule of two's.     No change rx needed    I had an extended discussion with the patient reviewing all relevant studies completed to date and  lasting 15 to 20 minutes of a 25 minute visit    See device teaching which extended face to face time for this visit.  Each maintenance medication was reviewed in detail including emphasizing most importantly the difference between maintenance and prns and under what circumstances the prns are to be triggered using an action plan format that is not reflected in the computer generated alphabetically organized AVS which I have not found useful in most complex patients, especially with respiratory illnesses  Please see AVS for specific instructions unique to this visit that I personally wrote and verbalized to the the pt in detail and then reviewed with pt  by my nurse highlighting  any  changes in therapy recommended at today's visit to their plan of care.

## 2018-03-16 ENCOUNTER — Telehealth: Payer: Self-pay

## 2018-03-16 DIAGNOSIS — I10 Essential (primary) hypertension: Secondary | ICD-10-CM

## 2018-03-16 NOTE — Addendum Note (Signed)
Addended by: Lamar Blinks C on: 03/16/2018 02:32 PM   Modules accepted: Orders

## 2018-03-16 NOTE — Telephone Encounter (Signed)
Copied from [place] [id number]. Topic: Referral - Request >> Mar 14, 2018  9:43 AM Joshua [name] wrote: Reason for CRM:   Pt called in and would like [name] referral to cardiology. The only reason he wants the referral is for [name] checkup. He stated he like to go every 5 years to just touch base and make sure everything is ok with his heart.  He made and appt on 9/25 with Dr copland as well   Best number  [phone]

## 2018-03-16 NOTE — Telephone Encounter (Signed)
Ok

## 2018-03-19 NOTE — Progress Notes (Signed)
[place] at [facility] [address], [place], [place] [address] [phone]  Date:  03/22/2018   Name:  Joshua [name]   DOB:  [birth year]   MRN:  [id number]  PCP:  Darreld Mclean, MD    Chief Complaint: Back Pain (back pain radiating down to leg, sciatica) and Flu Vaccine   History of Present Illness:  Joshua [name] is a 67 y.o. very pleasant male patient who presents with the following:  History of HTN, cough/ asthma Last seen here in July with a rash and cough/ night sweats: Rash has resolved.  He will let me know if this comes back He has noted night sweats. Recent complete blood work normal Will obtain HIV and CXR today, check PSA  Ordered CXR, await BW and will be in touch with him  He has not noted any cough or fever  He is here today with left lower back pain that radiates down his left leg for a few weeks He had this in the past, it resolved and then came back He has noted this off and on 4-5 years now Never that severe or persistent It mostly bothers him when he lays down at night  He wonders if he needs a new mattress  Last year he pulled his RIGHT back, but no injury on the left side that he can recall He did do PT last year which helped his right sided back sx   He has perhaps mild tingling in the left leg but nothing that he has really taken notice of  No bowel or bladder changes.  He does have BPH but this is not changed He is on uroxatral  He had lumbar spine films last year-  LUMBAR SPINE - 2-3 VIEW COMPARISON:  None.  FINDINGS: Three views of the lumbar spine submitted. No acute fracture or subluxation. Mild lower lumbar levoscoliosis. Mild anterior spurring upper endplate of L4 and L5 vertebral body. Mild disc space flattening at L4-L5 and L5-S1 level. Facet degenerative changes are noted L4 and L5 level. Mild anterior spurring upper endplate of P61 vertebral body. Minimal anterior spurring upper  endplate of L2 vertebral body.  IMPRESSION: No acute fracture or subluxation. Mild degenerative changes as described above. Mild levoscoliosis.  Flu shot today   He has had genital herpes for years He may have an outbreak every 2 months or so - 5-6x a year. He is not on suppression now but would like to go back on this after discussion.  He also likes to have the topical acyclovir to have on hand when he does have an outbreak  Patient Active Problem List   Diagnosis Date Noted  . Essential hypertension 01/17/2018  . Solitary lung nodule 09/07/2017  . Sinusitis, chronic/Cough 07/22/2016  . Chronic asthma, mild persistent, uncomplicated 95/02/3266  . Upper airway cough syndrome 06/29/2016    Past Medical History:  Diagnosis Date  . Asthma   . BPH (benign prostatic hyperplasia)   . GERD (gastroesophageal reflux disease)   . HLD (hyperlipidemia)   . HSV infection   . Hypertension   . Kidney stones     Past Surgical History:  Procedure Laterality Date  . APPENDECTOMY    . BREAST CYST EXCISION Left    benign  . LITHOTRIPSY    . TONSILLECTOMY      Social History   Tobacco Use  . Smoking status: Former Smoker    Packs/day: 0.50  Years: 22.00    Pack years: 11.00    Types: Cigarettes    Last attempt to quit: 06/28/1993    Years since quitting: 24.7  . Smokeless tobacco: Never Used  Substance Use Topics  . Alcohol use: Yes    Comment: occasionally  . Drug use: No    Family History  Problem Relation Age of Onset  . Alzheimer's disease Father   . Kidney Stones Mother   . Colon cancer Paternal Grandfather 50    No Known Allergies  Medication list has been reviewed and updated.  Current Outpatient Medications on File Prior to Visit  Medication Sig Dispense Refill  . albuterol (PROAIR HFA) 108 (90 Base) MCG/ACT inhaler Inhale 1-2 puffs into the lungs every 6 (six) hours as needed for wheezing or shortness of breath. 1 each 2  . alfuzosin (UROXATRAL) 10 MG 24  hr tablet Take 1 tablet (10 mg total) by mouth daily. 90 tablet 3  . budesonide-formoterol (SYMBICORT) 80-4.5 MCG/ACT inhaler Take 2 puffs first thing in am and then another 2 puffs about 12 hours later. 1 Inhaler 11  . losartan (COZAAR) 100 MG tablet Take 1 tablet (100 mg total) by mouth daily. 90 tablet 3  . pantoprazole (PROTONIX) 40 MG tablet Take 1 tablet (40 mg total) by mouth daily. 90 tablet 3   No current facility-administered medications on file prior to visit.     Review of Systems:  As per HPI- otherwise negative. No fever or chills No CP or SOB   Physical Examination: Vitals:   03/22/18 1413  BP: 136/90  Pulse: 81  Resp: 16  Temp: 98.1 F (36.7 C)  SpO2: 97%   Vitals:   03/22/18 1413  Weight: 206 lb (93.4 kg)  Height: 5' 7.75" (1.721 m)   Body mass index is 31.55 kg/m. Ideal Body Weight: Weight in (lb) to have BMI = 25: 162.9  .GEN: WDWN, NAD, Non-toxic, A & O x 3, overweight, looks well  HEENT: Atraumatic, Normocephalic. Neck supple. No masses, No LAD.  Bilateral TM wnl, oropharynx normal.  PEERL,EOMI.   Ears and Nose: No external deformity.   CV: RRR, No M/G/R. No JVD. No thrill. No extra heart sounds. PULM: CTA B, no wheezes, crackles, rhonchi. No retractions. No resp. distress. No accessory muscle use. ABD: S, NT, ND EXTR: No c/c/e NEURO Normal gait.  PSYCH: Normally interactive. Conversant. Not depressed or anxious appearing.  Calm demeanor. Normal strength, sensation and DTR of BLE, negative SLR He indicates tenderness in the left upper buttock.  Left paraspinous muscles are in spasm here and in the lumbar region Normal TL flexion and extension    Assessment and Plan: Low back pain radiating to left lower extremity - Plan: cyclobenzaprine (FLEXERIL) 10 MG tablet  HSV (herpes simplex virus) anogenital infection - Plan: valACYclovir (VALTREX) 500 MG tablet, acyclovir ointment (ZOVIRAX) 5 %  Immunization due - Plan: CANCELED: Flu Vaccine QUAD 36+  mos IM  Need for influenza vaccination - Plan: Flu vaccine HIGH DOSE PF (Fluzone High dose)  Lower back strain Flexeril rx to use as needed If not helpful in a few days will call in prednisone Valtrex for daily rx for suppression Flu shot today  Signed Lamar Blinks, MD

## 2018-03-22 ENCOUNTER — Encounter: Payer: Self-pay | Admitting: Family Medicine

## 2018-03-22 ENCOUNTER — Ambulatory Visit (INDEPENDENT_AMBULATORY_CARE_PROVIDER_SITE_OTHER): Payer: Medicare Other | Admitting: Family Medicine

## 2018-03-22 VITALS — BP 136/90 | HR 81 | Temp 98.1°F | Resp 16 | Ht 67.75 in | Wt 206.0 lb

## 2018-03-22 DIAGNOSIS — M545 Low back pain, unspecified: Secondary | ICD-10-CM

## 2018-03-22 DIAGNOSIS — A609 Anogenital herpesviral infection, unspecified: Secondary | ICD-10-CM

## 2018-03-22 DIAGNOSIS — M79605 Pain in left leg: Secondary | ICD-10-CM | POA: Diagnosis not present

## 2018-03-22 DIAGNOSIS — Z23 Encounter for immunization: Secondary | ICD-10-CM | POA: Diagnosis not present

## 2018-03-22 MED ORDER — VALACYCLOVIR HCL 500 MG PO TABS: 500.0000 mg | ORAL_TABLET | Freq: Every day | ORAL | 3 refills | Status: DC

## 2018-03-22 MED ORDER — ACYCLOVIR 5 % EX OINT: 1.0000 "application " | TOPICAL_OINTMENT | CUTANEOUS | 3 refills | Status: DC

## 2018-03-22 MED ORDER — CYCLOBENZAPRINE HCL 10 MG PO TABS: ORAL_TABLET | ORAL | 0 refills | Status: DC

## 2018-03-22 NOTE — Patient Instructions (Addendum)
Flu shot today Use the muscle relaxer as needed for your back pain- remember this can make you feel sleepy!  Don't take when you need to drive  You can also use tylenol/ ibuprofen as needed for your pain  If your back is not feeling better in the next few days let me know and we can try some prednisone  For your herpes, let's try and suppress outbreaks with valtrex 500 mg once a day You can also use your topical acyclovir ointment as needed

## 2018-03-29 ENCOUNTER — Other Ambulatory Visit: Payer: Self-pay | Admitting: Family Medicine

## 2018-03-31 DIAGNOSIS — N401 Enlarged prostate with lower urinary tract symptoms: Secondary | ICD-10-CM | POA: Insufficient documentation

## 2018-03-31 DIAGNOSIS — N138 Other obstructive and reflux uropathy: Secondary | ICD-10-CM | POA: Diagnosis not present

## 2018-03-31 DIAGNOSIS — R300 Dysuria: Secondary | ICD-10-CM | POA: Diagnosis not present

## 2018-03-31 DIAGNOSIS — Z79899 Other long term (current) drug therapy: Secondary | ICD-10-CM | POA: Diagnosis not present

## 2018-04-06 ENCOUNTER — Ambulatory Visit (INDEPENDENT_AMBULATORY_CARE_PROVIDER_SITE_OTHER): Payer: Medicare Other | Admitting: Cardiology

## 2018-04-06 VITALS — BP 132/80 | HR 90 | Ht 65.0 in | Wt 207.0 lb

## 2018-04-06 DIAGNOSIS — I10 Essential (primary) hypertension: Secondary | ICD-10-CM

## 2018-04-06 DIAGNOSIS — R0609 Other forms of dyspnea: Secondary | ICD-10-CM

## 2018-04-06 DIAGNOSIS — J4530 Mild persistent asthma, uncomplicated: Secondary | ICD-10-CM

## 2018-04-06 DIAGNOSIS — E785 Hyperlipidemia, unspecified: Secondary | ICD-10-CM

## 2018-04-06 DIAGNOSIS — R072 Precordial pain: Secondary | ICD-10-CM | POA: Diagnosis not present

## 2018-04-06 MED ORDER — ASPIRIN EC 81 MG PO TBEC: 81.0000 mg | DELAYED_RELEASE_TABLET | Freq: Every day | ORAL | 3 refills | Status: DC

## 2018-04-06 MED ORDER — ATORVASTATIN CALCIUM 10 MG PO TABS: 10.0000 mg | ORAL_TABLET | Freq: Every day | ORAL | 3 refills | Status: DC

## 2018-04-06 NOTE — Progress Notes (Signed)
Cardiology consultation note:    Date:  04/06/2018   ID:  Joshua [name], DOB [birth year], MRN [id number]  PCP:  Darreld Mclean, MD  Cardiologist:  Jenne Campus, MD    Referring MD: Darreld Mclean, MD   Chief Complaint  Patient presents with  . Hypertension  I have shortness of breath and chest pain  History of Present Illness:    Joshua [name] is a 67 y.o. male who was originally from [place] for about 5 to 6 years he experiencing tightness in the chest while walking he thinks it is related to his asthma that he has for years.  When he walks he gets tightness in the chest and shortness of breath and keep going he never had to stop because of the sensation after eventually he reached his destination he will stop and the sensation goes away within the next couple minutes there is no sweating associated with this sensation.  Couple years ago he seen a cardiologist who did some stress testing on him and apparently everything was fine except 1 of his valve have some issue but he was told not to be worried.  He does have mild dyslipidemia as well as hypertension which appears to be somewhat difficult to control.  He thinks medication he takes for his hypertension stopped working. He does not have any family of premature coronary artery disease.  He is to smoke but stopped many years ago.  Past Medical History:  Diagnosis Date  . Asthma   . BPH (benign prostatic hyperplasia)   . GERD (gastroesophageal reflux disease)   . HLD (hyperlipidemia)   . HSV infection   . Hypertension   . Kidney stones     Past Surgical History:  Procedure Laterality Date  . APPENDECTOMY    . BREAST CYST EXCISION Left    benign  . LITHOTRIPSY    . TONSILLECTOMY      Current Medications: Current Meds  Medication Sig  . acyclovir ointment (ZOVIRAX) 5 % Apply 1 application topically every 3 (three) hours. Use for one week as needed for outbreak  . albuterol (PROVENTIL HFA;VENTOLIN HFA) 108 (90  Base) MCG/ACT inhaler INHALE 1-2 PUFFS INTO THE LUNGS EVERY 6 (SIX) HOURS AS NEEDED FOR WHEEZING OR SHORTNESS OF BREATH.  Marland Kitchen alfuzosin (UROXATRAL) 10 MG 24 hr tablet Take 1 tablet (10 mg total) by mouth daily.  . budesonide-formoterol (SYMBICORT) 80-4.5 MCG/ACT inhaler Take 2 puffs first thing in am and then another 2 puffs about 12 hours later.  . cyclobenzaprine (FLEXERIL) 10 MG tablet Take 1/2 or 1 twice a day as needed for back pain  . losartan (COZAAR) 100 MG tablet Take 1 tablet (100 mg total) by mouth daily.  . pantoprazole (PROTONIX) 40 MG tablet Take 1 tablet (40 mg total) by mouth daily.  . valACYclovir (VALTREX) 500 MG tablet Take 1 tablet (500 mg total) by mouth daily.     Allergies:   Patient has no known allergies.   Social History   Socioeconomic History  . Marital status: Married    Spouse name: Not on file  . Number of children: 2  . Years of education: Not on file  . Highest education level: Not on file  Occupational History  . Occupation: Engineer, maintenance (IT)  Social Needs  . Financial resource strain: Not on file  . Food insecurity:    Worry: Not on file    Inability: Not on file  . Transportation needs:    Medical: Not on file  Non-medical: Not on file  Tobacco Use  . Smoking status: Former Smoker    Packs/day: 0.50    Years: 22.00    Pack years: 11.00    Types: Cigarettes    Last attempt to quit: 06/28/1993    Years since quitting: 24.7  . Smokeless tobacco: Never Used  Substance and Sexual Activity  . Alcohol use: Yes    Comment: occasionally  . Drug use: No  . Sexual activity: Not on file  Lifestyle  . Physical activity:    Days per week: Not on file    Minutes per session: Not on file  . Stress: Not on file  Relationships  . Social connections:    Talks on phone: Not on file    Gets together: Not on file    Attends religious service: Not on file    Active member of club or organization: Not on file    Attends meetings of clubs or organizations:  Not on file    Relationship status: Not on file  Other Topics Concern  . Not on file  Social History Narrative  . Not on file     Family History: The patient's family history includes Alzheimer's disease in his father; Colon cancer (age of onset: 34) in his paternal grandfather; Kidney Stones in his mother. ROS:   Please see the history of present illness.    All 14 point review of systems negative except as described per history of present illness  EKGs/Labs/Other Studies Reviewed:    Normal sinus rhythm normal P interval normal QS complex duration morphology no ST segment changes  Recent Labs: 12/22/2017: ALT 17; BUN 17; Creatinine, Ser 1.07; Hemoglobin 14.7; Platelets 243.0; Potassium 4.1; Sodium 138  Recent Lipid Panel    Component Value Date/Time   CHOL 203 (H) 12/22/2017 0731   TRIG 98.0 12/22/2017 0731   HDL 54.20 12/22/2017 0731   CHOLHDL 4 12/22/2017 0731   VLDL 19.6 12/22/2017 0731   LDLCALC 129 (H) 12/22/2017 0731    Physical Exam:    VS:  BP 132/80   Pulse 90   Ht 5\' 5"  (1.651 m)   Wt 207 lb (93.9 kg)   SpO2 97%   BMI 34.45 kg/m     Wt Readings from Last 3 Encounters:  04/06/18 207 lb (93.9 kg)  03/22/18 206 lb (93.4 kg)  02/28/18 203 lb (92.1 kg)     GEN:  Well nourished, well developed in no acute distress HEENT: Normal NECK: No JVD; No carotid bruits LYMPHATICS: No lymphadenopathy CARDIAC: RRR, no murmurs, no rubs, no gallops RESPIRATORY:  Clear to auscultation without rales, wheezing or rhonchi  ABDOMEN: Soft, non-tender, non-distended MUSCULOSKELETAL:  No edema; No deformity  SKIN: Warm and dry LOWER EXTREMITIES: no swelling NEUROLOGIC:  Alert and oriented x 3 PSYCHIATRIC:  Normal affect   ASSESSMENT:    1. Essential hypertension   2. Precordial chest pain   3. Dyspnea on exertion   4. Chronic asthma, mild persistent, uncomplicated   5. Dyslipidemia    PLAN:    In order of problems listed above:  1. Essential hypertension.  I  asked him to check his blood pressure on the regular basis for the next 2 weeks ask him to take and out of it I instructed him that for blood pressure check he need to sit down and relax for about 5 minutes before checking his blood pressure.  He will bring results of his blood pressure measurements to me in about 2  weeks as well as will bring his blood pressure monitor so he can check accuracy of the device.  For now I will not altering any of his medication. 2. Chest pain that does have some worrisome future.  I will ask him to have a stress test we will do stress echocardiogram also ask him to start taking one baby aspirin every single day.  On top of that I will initiate therapy of statin.  According to him he got the sensation for many years and previously had stress test done which was fine however it looks like chest pain at least look suspicious there for clarification need to be done. 3. Dyspnea on exertion.  Could be related to his asthma however I will ask him to have an echocardiogram to assess left ventricular ejection fraction. 4. Chronic asthma which is mild followed by internal medicine team. 5. Dyslipidemia he is LDL is 129.  With suspicion of coronary artery disease I will initiate therapy with statin I will give him 10 mg Lipitor.  I told him that after stress test will be done will revisit that issue and modify his therapy as needed.   Medication Adjustments/Labs and Tests Ordered: Current medicines are reviewed at length with the patient today.  Concerns regarding medicines are outlined above.  No orders of the defined types were placed in this encounter.  Medication changes: No orders of the defined types were placed in this encounter.   Signed, Park Liter, MD, [facility] 04/06/2018 12:13 PM    [place]

## 2018-04-06 NOTE — Patient Instructions (Signed)
Medication Instructions:  START: Lisinopril 10 mg daily START: Aspirin 81 mg daily     If you need a refill on your cardiac medications before your next appointment, please call your pharmacy.   Lab work: None.  If you have labs (blood work) drawn today and your tests are completely normal, you will receive your results only by: Marland Kitchen MyChart Message (if you have MyChart) OR . A paper copy in the mail If you have any lab test that is abnormal or we need to change your treatment, we will call you to review the results.  Testing/Procedures: Your physician has requested that you have an echocardiogram. Echocardiography is a painless test that uses sound waves to create images of your heart. It provides your doctor with information about the size and shape of your heart and how well your heart's chambers and valves are working. This procedure takes approximately one hour. There are no restrictions for this procedure.  Your physician has requested that you have a stress echocardiogram. For further information please visit [email or web address]. Please follow instruction sheet as given.    Follow-Up: At [facility], you and your health needs are our priority.  As part of our continuing mission to provide you with exceptional heart care, we have created designated Provider Care Teams.  These Care Teams include your primary Cardiologist (physician) and Advanced Practice Providers (APPs -  Physician Assistants and Nurse Practitioners) who all work together to provide you with the care you need, when you need it. You will need a follow up appointment in 1 months.  Please call our office 2 months in advance to schedule this appointment.  You may see Jenne Campus, MD or another member of our [place] Provider Team in [place]: Shirlee More, MD . Jyl Heinz, MD  Any Other Special Instructions Will Be Listed Below (If Applicable).   Echocardiogram An echocardiogram, or  echocardiography, uses sound waves (ultrasound) to produce an image of your heart. The echocardiogram is simple, painless, obtained within a short period of time, and offers valuable information to your health care provider. The images from an echocardiogram can provide information such as:  Evidence of coronary artery disease (CAD).  Heart size.  Heart muscle function.  Heart valve function.  Aneurysm detection.  Evidence of a past heart attack.  Fluid buildup around the heart.  Heart muscle thickening.  Assess heart valve function.  Tell a health care provider about:  Any allergies you have.  All medicines you are taking, including vitamins, herbs, eye drops, creams, and over-the-counter medicines.  Any problems you or family members have had with anesthetic medicines.  Any blood disorders you have.  Any surgeries you have had.  Any medical conditions you have.  Whether you are pregnant or may be pregnant. What happens before the procedure? No special preparation is needed. Eat and drink normally. What happens during the procedure?  In order to produce an image of your heart, gel will be applied to your chest and a wand-like tool (transducer) will be moved over your chest. The gel will help transmit the sound waves from the transducer. The sound waves will harmlessly bounce off your heart to allow the heart images to be captured in real-time motion. These images will then be recorded.  You may need an IV to receive a medicine that improves the quality of the pictures. What happens after the procedure? You may return to your normal schedule including diet, activities, and medicines, unless your health care  provider tells you otherwise. This information is not intended to replace advice given to you by your health care provider. Make sure you discuss any questions you have with your health care provider. Document Released: 06/11/2000 Document Revised: 01/31/2016 Document  Reviewed: 02/19/2013 Elsevier Interactive Patient Education  2017 [place] [employer].    Exercise Stress Echocardiogram An exercise stress echocardiogram is a test to check how well your heart is working. This test uses sound waves (ultrasound) and a computer to make images of your heart before and after exercise. Ultrasound images that are taken before you exercise (your resting echocardiogram) will show how much blood is getting to your heart muscle and how well your heart muscle and heart valves are functioning. During the next part of this test, you will walk on a treadmill or ride a stationary bike to see how exercise affects your heart. While you exercise, the electrical activity of your heart will be monitored with an electrocardiogram (ECG). Your blood pressure will also be monitored. You may have this test if you:  Have chest pain or other symptoms of a heart problem.  Recently had a heart attack or heart surgery.  Have heart valve problems.  Have a condition that causes narrowing of the blood vessels that supply your heart (coronary artery disease).  Have a high risk of heart disease and are starting a new exercise program.  Have a high risk of heart disease and need to have major surgery.  Tell a health care provider about:  Any allergies you have.  All medicines you are taking, including vitamins, herbs, eye drops, creams, and over-the-counter medicines.  Any problems you or family members have had with anesthetic medicines.  Any blood disorders you have.  Any surgeries you have had.  Any medical conditions you have.  Whether you are pregnant or may be pregnant. What are the risks? Generally, this is a safe procedure. However, problems may occur, including:  Chest pain.  Dizziness or light-headedness.  Shortness of breath.  Increased or irregular heartbeat (palpitations).  Nausea or vomiting.  Heart attack (very rare).  What happens before the  procedure?  Follow instructions from your health care provider about eating or drinking restrictions. You may be asked to avoid all forms of caffeine for 24 hours before your procedure, or as told by your health care provider.  Ask your health care provider about changing or stopping your regular medicines. This is especially important if you are taking diabetes medicines or blood thinners.  If you use an inhaler, bring it with you to the test.  Wear loose, comfortable clothing and walking shoes.  Do notuse any products that contain nicotine or tobacco, such as cigarettes and e-cigarettes, for 4 hours before the test or as told by your health care provider. If you need help quitting, ask your health care provider. What happens during the procedure?  You will take off your clothes from the waist up and put on a hospital gown.  A technician will place electrodes on your chest.  A blood pressure cuff will be placed on your arm.  You will lie down on a table for an ultrasound exam before you exercise. Gel will be rubbed on your chest, and a handheld device (transducer) will be pressed against your chest and moved over your heart.  Then, you will start exercising by walking on a treadmill or pedaling a stationary bicycle.  Your blood pressure and heart rhythm will be monitored while you exercise.  The exercise  will gradually get harder or faster.  You will exercise until: ? Your heart reaches a target level. ? You are too tired to continue. ? You cannot continue because of chest pain, weakness, or dizziness.  You will have another ultrasound exam after you stop exercising. The procedure may vary among health care providers and hospitals. What happens after the procedure?  Your heart rate and blood pressure will be monitored until they return to your normal levels. Summary  An exercise stress echocardiogram is a test that uses ultrasound to check how well your heart works before and  after exercise.  Before the test, follow instructions from your health care provider about stopping medications, avoiding nicotine and tobacco, and avoiding certain foods and drinks.  During the test, your blood pressure and heart rhythm will be monitored while you exercise on a treadmill or stationary bicycle. This information is not intended to replace advice given to you by your health care provider. Make sure you discuss any questions you have with your health care provider. Document Released: 06/18/2004 Document Revised: 02/04/2016 Document Reviewed: 02/04/2016 Elsevier Interactive Patient Education  2018 [employer].   Aspirin and Your Heart Aspirin is a medicine that affects the way blood clots. Aspirin can be used to help reduce the risk of blood clots, heart attacks, and other heart-related problems. Should I take aspirin? Your health care provider will help you determine whether it is safe and beneficial for you to take aspirin daily. Taking aspirin daily may be beneficial if you:  Have had a heart attack or chest pain.  Have undergone open heart surgery such as coronary artery bypass surgery (CABG).  Have had coronary angioplasty.  Have experienced a stroke or transient ischemic attack (TIA).  Have peripheral vascular disease (PVD).  Have chronic heart rhythm problems such as atrial fibrillation.  Are there any risks of taking aspirin daily? Daily use of aspirin can increase your risk of side effects. Some of these include:  Bleeding. Bleeding problems can be minor or serious. An example of a minor problem is a cut that does not stop bleeding. An example of a more serious problem is stomach bleeding or bleeding into the brain. Your risk of bleeding is increased if you are also taking non-steroidal anti-inflammatory medicine (NSAIDs).  Increased bruising.  Upset stomach.  An allergic reaction. People who have nasal polyps have an increased risk of developing an aspirin  allergy.  What are some guidelines I should follow when taking aspirin?  Take aspirin only as directed by your health care provider. Make sure you understand how much you should take and what form you should take. The two forms of aspirin are: ? Non-enteric-coated. This type of aspirin does not have a coating and is absorbed quickly. Non-enteric-coated aspirin is usually recommended for people with chest pain. This type of aspirin also comes in a chewable form. ? Enteric-coated. This type of aspirin has a special coating that releases the medicine very slowly. Enteric-coated aspirin causes less stomach upset than non-enteric-coated aspirin. This type of aspirin should not be chewed or crushed.  Drink alcohol in moderation. Drinking alcohol increases your risk of bleeding. When should I seek medical care?  You have unusual bleeding or bruising.  You have stomach pain.  You have an allergic reaction. Symptoms of an allergic reaction include: ? Hives. ? Itchy skin. ? Swelling of the lips, tongue, or face.  You have ringing in your ears. When should I seek immediate medical care?  Your  bowel movements are bloody, dark red, or black in color.  You vomit or cough up blood.  You have blood in your urine.  You cough, wheeze, or feel short of breath. If you have any of the following symptoms, this is an emergency. Do not wait to see if the pain will go away. Get medical help at once. Call your local emergency services (911 in the [place] Do not drive yourself to the hospital.  You have severe chest pain, especially if the pain is crushing or pressure-like and spreads to the arms, back, neck, or jaw.  You have stroke-like symptoms, such as: ? Loss of vision. ? Difficulty talking. ? Numbness or weakness on one side of your body. ? Numbness or weakness in your arm or leg. ? Not thinking clearly or feeling confused.  This information is not intended to replace advice given to you by your  health care provider. Make sure you discuss any questions you have with your health care provider. Document Released: 05/27/2008 Document Revised: 10/22/2015 Document Reviewed: 09/19/2013 Elsevier Interactive Patient Education  2018 [employer].  Atorvastatin tablets What is this medicine? ATORVASTATIN (a TORE va sta tin) is known as a HMG-CoA reductase inhibitor or 'statin'. It lowers the level of cholesterol and triglycerides in the blood. This drug may also reduce the risk of heart attack, stroke, or other health problems in patients with risk factors for heart disease. Diet and lifestyle changes are often used with this drug. This medicine may be used for other purposes; ask your health care provider or pharmacist if you have questions. COMMON BRAND NAME(S): Lipitor What should I tell my health care provider before I take this medicine? They need to know if you have any of these conditions: -frequently drink alcoholic beverages -history of stroke, TIA -kidney disease -liver disease -muscle aches or weakness -other medical condition -an unusual or allergic reaction to atorvastatin, other medicines, foods, dyes, or preservatives -pregnant or trying to get pregnant -breast-feeding How should I use this medicine? Take this medicine by mouth with a glass of water. Follow the directions on the prescription label. You can take this medicine with or without food. Take your doses at regular intervals. Do not take your medicine more often than directed. Talk to your pediatrician regarding the use of this medicine in children. While this drug may be prescribed for children as young as 67 years old for selected conditions, precautions do apply. Overdosage: If you think you have taken too much of this medicine contact a poison control center or emergency room at once. NOTE: This medicine is only for you. Do not share this medicine with others. What if I miss a dose? If you miss a dose, take it as  soon as you can. If it is almost time for your next dose, take only that dose. Do not take double or extra doses. What may interact with this medicine? Do not take this medicine with any of the following medications: -red yeast rice -telaprevir -telithromycin -voriconazole This medicine may also interact with the following medications: -alcohol -antiviral medicines for HIV or AIDS -boceprevir -certain antibiotics like clarithromycin, erythromycin, troleandomycin -certain medicines for cholesterol like fenofibrate or gemfibrozil -cimetidine -clarithromycin -colchicine -cyclosporine -digoxin -male hormones, like estrogens or progestins and birth control pills -grapefruit juice -medicines for fungal infections like fluconazole, itraconazole, ketoconazole -niacin -rifampin -spironolactone This list may not describe all possible interactions. Give your health care provider a list of all the medicines, herbs, non-prescription drugs, or dietary  supplements you use. Also tell them if you smoke, drink alcohol, or use illegal drugs. Some items may interact with your medicine. What should I watch for while using this medicine? Visit your doctor or health care professional for regular check-ups. You may need regular tests to make sure your liver is working properly. Tell your doctor or health care professional right away if you get any unexplained muscle pain, tenderness, or weakness, especially if you also have a fever and tiredness. Your doctor or health care professional may tell you to stop taking this medicine if you develop muscle problems. If your muscle problems do not go away after stopping this medicine, contact your health care professional. This drug is only part of a total heart-health program. Your doctor or a dietician can suggest a low-cholesterol and low-fat diet to help. Avoid alcohol and smoking, and keep a proper exercise schedule. Do not use this drug if you are pregnant or  breast-feeding. Serious side effects to an unborn child or to an infant are possible. Talk to your doctor or pharmacist for more information. This medicine may affect blood sugar levels. If you have diabetes, check with your doctor or health care professional before you change your diet or the dose of your diabetic medicine. If you are going to have surgery tell your health care professional that you are taking this drug. What side effects may I notice from receiving this medicine? Side effects that you should report to your doctor or health care professional as soon as possible: -allergic reactions like skin rash, itching or hives, swelling of the face, lips, or tongue -dark urine -fever -joint pain -muscle cramps, pain -redness, blistering, peeling or loosening of the skin, including inside the mouth -trouble passing urine or change in the amount of urine -unusually weak or tired -yellowing of eyes or skin Side effects that usually do not require medical attention (report to your doctor or health care professional if they continue or are bothersome): -constipation -heartburn -stomach gas, pain, upset This list may not describe all possible side effects. Call your doctor for medical advice about side effects. You may report side effects to FDA at 1-800-FDA-1088. Where should I keep my medicine? Keep out of the reach of children. Store at room temperature between 20 to 25 degrees C (68 to 77 degrees F). Throw away any unused medicine after the expiration date. NOTE: This sheet is a summary. It may not cover all possible information. If you have questions about this medicine, talk to your doctor, pharmacist, or health care provider.  2018 Elsevier/Gold Standard (2011-05-04 54:00:86)

## 2018-04-14 ENCOUNTER — Ambulatory Visit (HOSPITAL_BASED_OUTPATIENT_CLINIC_OR_DEPARTMENT_OTHER)
Admission: RE | Admit: 2018-04-14 | Discharge: 2018-04-14 | Disposition: A | Discharge status: Home / Self Care | Payer: Medicare Other | Source: Ambulatory Visit | Attending: Cardiology | Admitting: Cardiology

## 2018-04-14 DIAGNOSIS — E785 Hyperlipidemia, unspecified: Secondary | ICD-10-CM | POA: Insufficient documentation

## 2018-04-14 DIAGNOSIS — R0609 Other forms of dyspnea: Secondary | ICD-10-CM | POA: Insufficient documentation

## 2018-04-14 DIAGNOSIS — I10 Essential (primary) hypertension: Secondary | ICD-10-CM | POA: Diagnosis not present

## 2018-04-14 DIAGNOSIS — R072 Precordial pain: Secondary | ICD-10-CM | POA: Diagnosis not present

## 2018-04-14 NOTE — Progress Notes (Signed)
  Echocardiogram 2D Echocardiogram has been performed.  Joshua [name] T Elleanna Melling 04/14/2018, 2:51 PM

## 2018-04-17 ENCOUNTER — Ambulatory Visit (HOSPITAL_BASED_OUTPATIENT_CLINIC_OR_DEPARTMENT_OTHER)
Admission: RE | Admit: 2018-04-17 | Discharge: 2018-04-17 | Disposition: A | Discharge status: Home / Self Care | Payer: Medicare Other | Source: Ambulatory Visit | Attending: Cardiology | Admitting: Cardiology

## 2018-04-17 DIAGNOSIS — R0609 Other forms of dyspnea: Secondary | ICD-10-CM | POA: Diagnosis not present

## 2018-04-17 DIAGNOSIS — R072 Precordial pain: Secondary | ICD-10-CM | POA: Insufficient documentation

## 2018-04-17 DIAGNOSIS — R0600 Dyspnea, unspecified: Secondary | ICD-10-CM | POA: Diagnosis not present

## 2018-04-17 DIAGNOSIS — E785 Hyperlipidemia, unspecified: Secondary | ICD-10-CM | POA: Insufficient documentation

## 2018-04-17 DIAGNOSIS — I10 Essential (primary) hypertension: Secondary | ICD-10-CM | POA: Insufficient documentation

## 2018-04-17 NOTE — Progress Notes (Signed)
  Echocardiogram Echocardiogram Stress Test has been performed.  Joshua [name] 04/17/2018, 9:05 AM

## 2018-04-25 IMAGING — CT CT PARANASAL SINUSES LIMITED
1 of 2 series · 12 of 28 positions shown, 15 images · non-contrast
Comparison: None.

CLINICAL DATA: Chronic sinusitis.

EXAM:
CT PARANASAL SINUS LIMITED WITHOUT CONTRAST
TECHNIQUE: Non-contiguous multidetector CT images of the paranasal sinuses were
obtained in a single plane without contrast.

[Series 3: limited sinus st · axial · 0.21mm/px · z∈[-159,-49]mm · 12 of 14 slices shown, 15 images]
[im 2/14  brain]
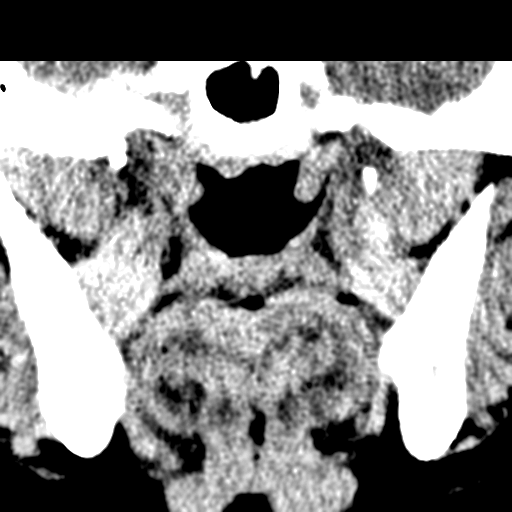
[im 2/14  bone]
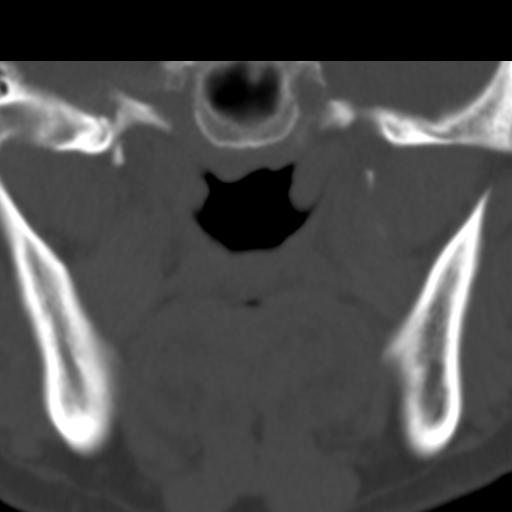
[im 3/14  bone]
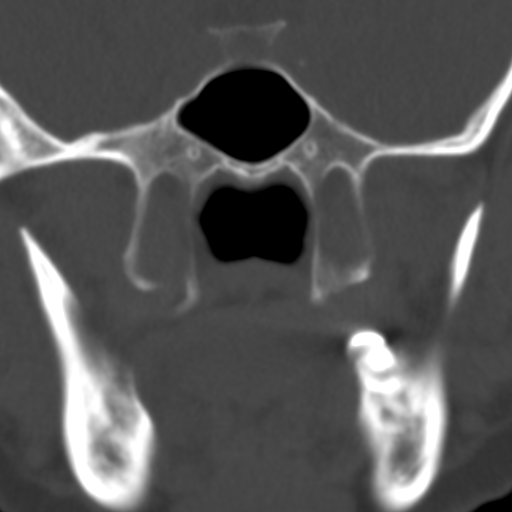
[im 4/14  bone]
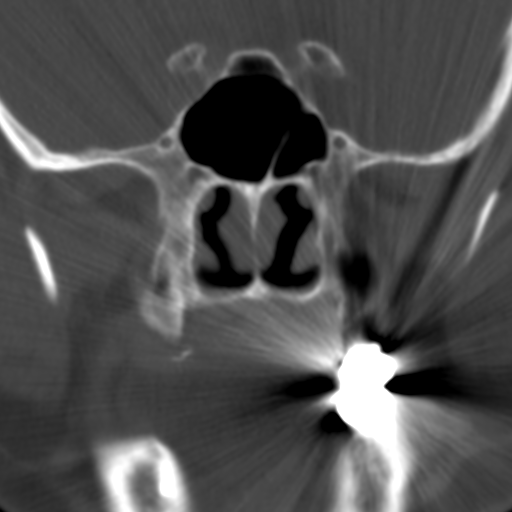
[im 5/14  bone]
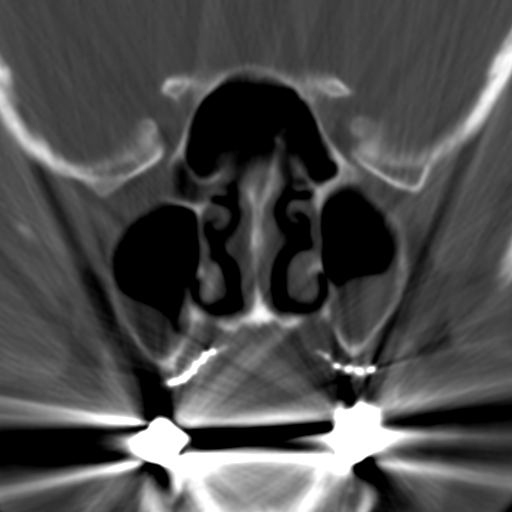
[im 6/14  brain]
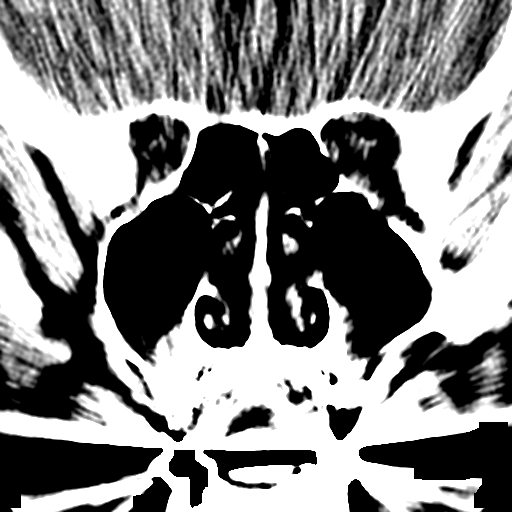
[im 6/14  bone]
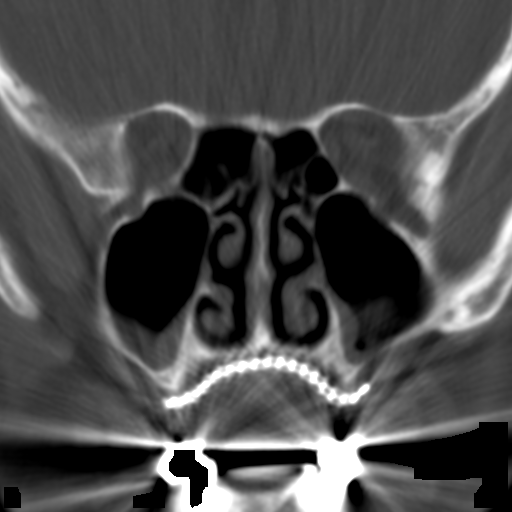
[im 7/14  bone]
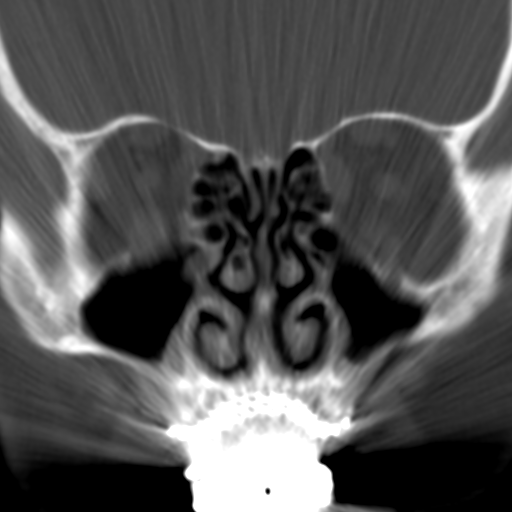
[im 8/14  bone]
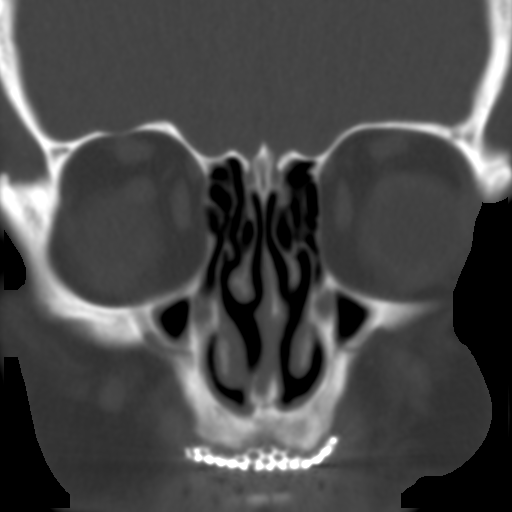
[im 9/14  bone]
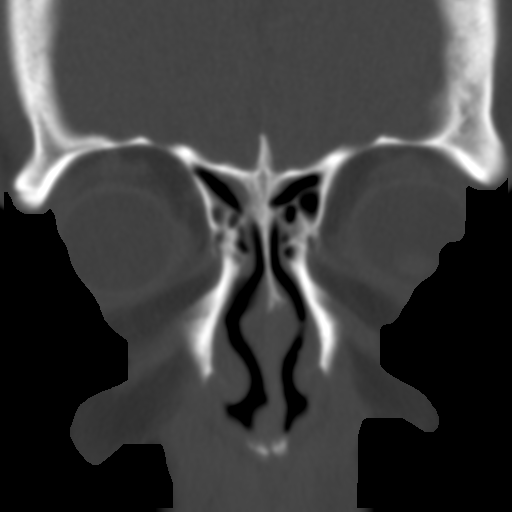
[im 10/14  brain]
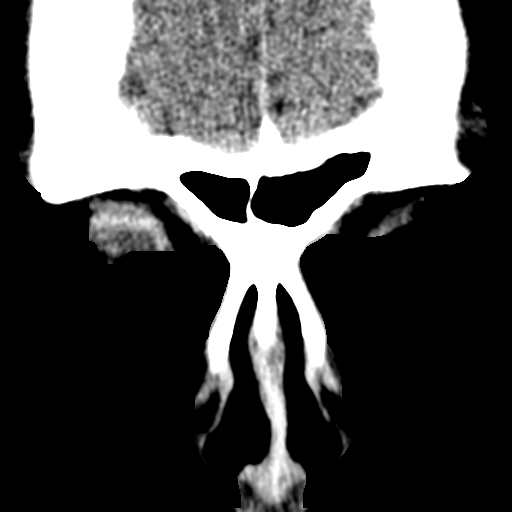
[im 10/14  bone]
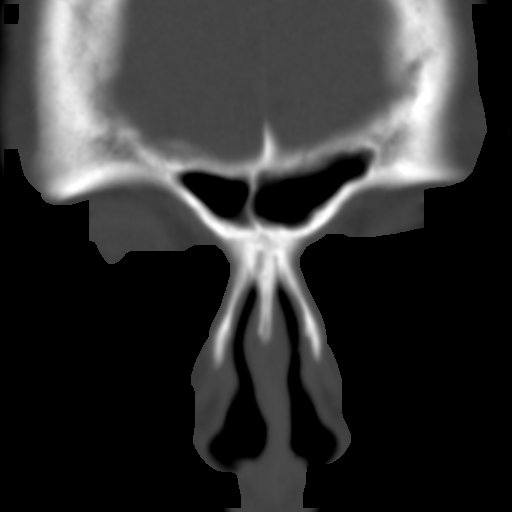
[im 11/14  bone]
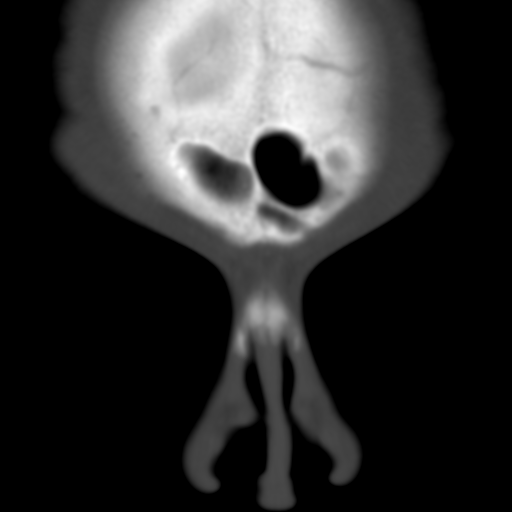
[im 12/14  bone]
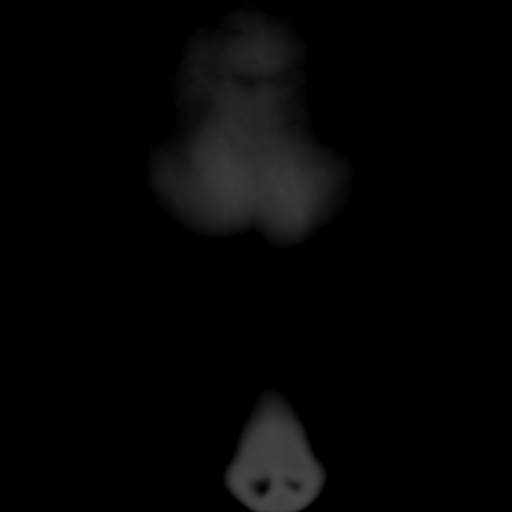
[im 13/14  bone]
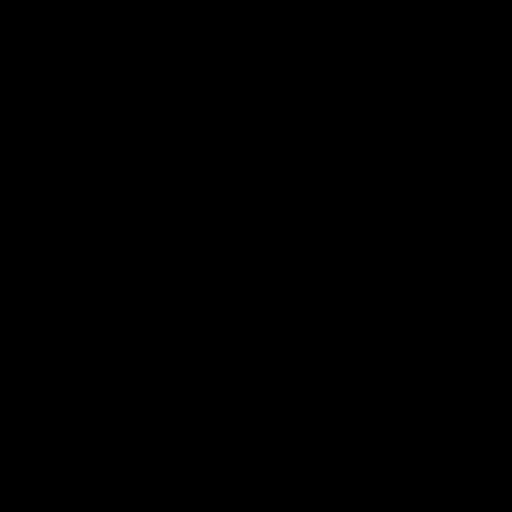

[12 of 28 positions shown; findings below may reference images not displayed]

FINDINGS: Exam is somewhat limited due to scatter artifact arising from
patient's dentures, which he refused to remove. No definite fracture
or other bony abnormality is noted. Visualized globes and orbits
appear normal. Mild mucosal thickening is noted inferiorly in both
maxillary sinuses. Remaining paranasal sinuses appear normal.
IMPRESSION: Mild mucosal thickening is noted inferiorly in both maxillary
sinuses suggesting chronic inflammation. No other evidence of
sinusitis is noted.

## 2018-05-05 ENCOUNTER — Ambulatory Visit: Payer: Medicare Other | Admitting: Cardiology

## 2018-05-22 ENCOUNTER — Other Ambulatory Visit: Payer: Self-pay | Admitting: Gastroenterology

## 2018-05-24 ENCOUNTER — Encounter: Payer: Self-pay | Admitting: Cardiology

## 2018-05-24 ENCOUNTER — Ambulatory Visit (INDEPENDENT_AMBULATORY_CARE_PROVIDER_SITE_OTHER): Payer: Medicare Other | Admitting: Cardiology

## 2018-05-24 VITALS — BP 126/74 | HR 63 | Ht 66.0 in | Wt 198.8 lb

## 2018-05-24 DIAGNOSIS — I10 Essential (primary) hypertension: Secondary | ICD-10-CM

## 2018-05-24 DIAGNOSIS — R072 Precordial pain: Secondary | ICD-10-CM | POA: Diagnosis not present

## 2018-05-24 DIAGNOSIS — E785 Hyperlipidemia, unspecified: Secondary | ICD-10-CM

## 2018-05-24 NOTE — Addendum Note (Signed)
Addended by: Ashok Norris on: 05/24/2018 03:44 PM   Modules accepted: Orders

## 2018-05-24 NOTE — Progress Notes (Signed)
Cardiology Office Note:    Date:  05/24/2018   ID:  Joshua [name], DOB 1951-06-18, MRN [id number]  PCP:  Darreld Mclean, MD  Cardiologist:  Jenne Campus, MD    Referring MD: Darreld Mclean, MD   Chief Complaint  Patient presents with  . 30month follow up  Doing fair  History of Present Illness:    Joshua [name] is a 67 y.o. male with atypical chest pain essential hypertension dyslipidemia comes today to office for follow-up just few days ago he came back from [place] he got cold there and still struggling with this.  Denies have any chest pain tightness squeezing pressure burning chest we did quite extensive evaluation which included echocardiogram which showed no significant finding also stress test was done he did well on the trouble he had no evidence of ischemia.  I think the key now will be to modify his risk factors for coronary artery disease  Past Medical History:  Diagnosis Date  . Asthma   . BPH (benign prostatic hyperplasia)   . GERD (gastroesophageal reflux disease)   . HLD (hyperlipidemia)   . HSV infection   . Hypertension   . Kidney stones     Past Surgical History:  Procedure Laterality Date  . APPENDECTOMY    . BREAST CYST EXCISION Left    benign  . LITHOTRIPSY    . TONSILLECTOMY      Current Medications: Current Meds  Medication Sig  . acyclovir ointment (ZOVIRAX) 5 % Apply 1 application topically every 3 (three) hours. Use for one week as needed for outbreak  . albuterol (PROVENTIL HFA;VENTOLIN HFA) 108 (90 Base) MCG/ACT inhaler INHALE 1-2 PUFFS INTO THE LUNGS EVERY 6 (SIX) HOURS AS NEEDED FOR WHEEZING OR SHORTNESS OF BREATH.  Marland Kitchen alfuzosin (UROXATRAL) 10 MG 24 hr tablet Take 1 tablet (10 mg total) by mouth daily.  Marland Kitchen aspirin EC 81 MG tablet Take 1 tablet (81 mg total) by mouth daily.  Marland Kitchen atorvastatin (LIPITOR) 10 MG tablet Take 1 tablet (10 mg total) by mouth daily.  . budesonide-formoterol (SYMBICORT) 80-4.5 MCG/ACT inhaler Take 2 puffs first  thing in am and then another 2 puffs about 12 hours later.  . cyclobenzaprine (FLEXERIL) 10 MG tablet Take 1/2 or 1 twice a day as needed for back pain  . losartan (COZAAR) 100 MG tablet Take 1 tablet (100 mg total) by mouth daily.  . pantoprazole (PROTONIX) 40 MG tablet TAKE 1 TABLET BY MOUTH EVERY DAY  . valACYclovir (VALTREX) 500 MG tablet Take 1 tablet (500 mg total) by mouth daily.     Allergies:   Patient has no known allergies.   Social History   Socioeconomic History  . Marital status: Married    Spouse name: Not on file  . Number of children: 2  . Years of education: Not on file  . Highest education level: Not on file  Occupational History  . Occupation: Engineer, maintenance (IT)  Social Needs  . Financial resource strain: Not on file  . Food insecurity:    Worry: Not on file    Inability: Not on file  . Transportation needs:    Medical: Not on file    Non-medical: Not on file  Tobacco Use  . Smoking status: Former Smoker    Packs/day: 0.50    Years: 22.00    Pack years: 11.00    Types: Cigarettes    Last attempt to quit: 06/28/1993    Years since quitting: 24.9  . Smokeless tobacco:  Never Used  Substance and Sexual Activity  . Alcohol use: Yes    Comment: occasionally  . Drug use: No  . Sexual activity: Not on file  Lifestyle  . Physical activity:    Days per week: Not on file    Minutes per session: Not on file  . Stress: Not on file  Relationships  . Social connections:    Talks on phone: Not on file    Gets together: Not on file    Attends religious service: Not on file    Active member of club or organization: Not on file    Attends meetings of clubs or organizations: Not on file    Relationship status: Not on file  Other Topics Concern  . Not on file  Social History Narrative  . Not on file     Family History: The patient's family history includes Alzheimer's disease in his father; Colon cancer (age of onset: 65) in his paternal grandfather; Kidney  Stones in his mother. ROS:   Please see the history of present illness.    All 14 point review of systems negative except as described per history of present illness  EKGs/Labs/Other Studies Reviewed:      Recent Labs: 12/22/2017: ALT 17; BUN 17; Creatinine, Ser 1.07; Hemoglobin 14.7; Platelets 243.0; Potassium 4.1; Sodium 138  Recent Lipid Panel    Component Value Date/Time   CHOL 203 (H) 12/22/2017 0731   TRIG 98.0 12/22/2017 0731   HDL 54.20 12/22/2017 0731   CHOLHDL 4 12/22/2017 0731   VLDL 19.6 12/22/2017 0731   LDLCALC 129 (H) 12/22/2017 0731    Physical Exam:    VS:  BP 126/74   Pulse 63   Ht 5\' 6"  (1.676 m)   Wt 198 lb 12.8 oz (90.2 kg)   SpO2 96%   BMI 32.09 kg/m     Wt Readings from Last 3 Encounters:  05/24/18 198 lb 12.8 oz (90.2 kg)  04/06/18 207 lb (93.9 kg)  03/22/18 206 lb (93.4 kg)     GEN:  Well nourished, well developed in no acute distress HEENT: Normal NECK: No JVD; No carotid bruits LYMPHATICS: No lymphadenopathy CARDIAC: RRR, no murmurs, no rubs, no gallops RESPIRATORY:  Clear to auscultation without rales, wheezing or rhonchi  ABDOMEN: Soft, non-tender, non-distended MUSCULOSKELETAL:  No edema; No deformity  SKIN: Warm and dry LOWER EXTREMITIES: no swelling NEUROLOGIC:  Alert and oriented x 3 PSYCHIATRIC:  Normal affect   ASSESSMENT:    1. Essential hypertension   2. Dyslipidemia   3. Precordial chest pain    PLAN:    In order of problems listed above:  1. Essential hypertension he brought blood pressure measurements to me sometimes if he becomes blood pressure will be elevated I advised him to try to break because her in a half take half in the morning to have the afternoon and see if that helps 2. Dyslipidemia I will ask him to have fasting lipid profile done which will be done in about 2 weeks.  He is taking statin which advised to continue 3. Precordial chest pain stress test negative showing no evidence of ischemia we will  continue risk factors modifications   Medication Adjustments/Labs and Tests Ordered: Current medicines are reviewed at length with the patient today.  Concerns regarding medicines are outlined above.  No orders of the defined types were placed in this encounter.  Medication changes: No orders of the defined types were placed in this encounter.   Signed,  Park Liter, MD, [facility] 05/24/2018 2:54 PM    [facility]

## 2018-05-24 NOTE — Patient Instructions (Signed)
Medication Instructions:  Your physician recommends that you continue on your current medications as directed. Please refer to the Current Medication list given to you today.  If you need a refill on your cardiac medications before your next appointment, please call your pharmacy.   Lab work: Your physician recommends that you return for lab work in 2 weeks: FASTING lipids  If you have labs (blood work) drawn today and your tests are completely normal, you will receive your results only by: Marland Kitchen MyChart Message (if you have MyChart) OR . A paper copy in the mail If you have any lab test that is abnormal or we need to change your treatment, we will call you to review the results.  Testing/Procedures: None.   Follow-Up: At [facility], you and your health needs are our priority.  As part of our continuing mission to provide you with exceptional heart care, we have created designated Provider Care Teams.  These Care Teams include your primary Cardiologist (physician) and Advanced Practice Providers (APPs -  Physician Assistants and Nurse Practitioners) who all work together to provide you with the care you need, when you need it. You will need a follow up appointment in 4 months.  Please call our office 2 months in advance to schedule this appointment.  You may see Jenne Campus, MD or another member of our [place] Provider Team in [place]: Shirlee More, MD . Jyl Heinz, MD  Any Other Special Instructions Will Be Listed Below (If Applicable).

## 2018-05-31 ENCOUNTER — Ambulatory Visit (INDEPENDENT_AMBULATORY_CARE_PROVIDER_SITE_OTHER): Payer: Medicare Other | Admitting: Internal Medicine

## 2018-05-31 ENCOUNTER — Encounter: Payer: Self-pay | Admitting: Internal Medicine

## 2018-05-31 VITALS — BP 140/74 | HR 92 | Temp 98.1°F | Ht 66.0 in | Wt 201.0 lb

## 2018-05-31 DIAGNOSIS — J4530 Mild persistent asthma, uncomplicated: Secondary | ICD-10-CM

## 2018-05-31 DIAGNOSIS — R05 Cough: Secondary | ICD-10-CM | POA: Diagnosis not present

## 2018-05-31 DIAGNOSIS — R058 Other specified cough: Secondary | ICD-10-CM

## 2018-05-31 MED ORDER — AZITHROMYCIN 250 MG PO TABS: ORAL_TABLET | ORAL | 0 refills | Status: DC

## 2018-05-31 MED ORDER — PREDNISONE 10 MG PO TABS: ORAL_TABLET | ORAL | 0 refills | Status: DC

## 2018-05-31 NOTE — Progress Notes (Signed)
Subjective:     Patient ID: Joshua [name], male   DOB: Feb 07, 1951,    MRN: [id number]    Brief patient profile:  67 yo [place] male  with bad bronchitis around 1993 and quit smoking and then  all better until around 2000 while living in [place] with intermittent "wheezing" dx as asthma > never resolved  Self referred for dtc asthma   History of Present Illness  06/29/2016 1st [facility] office visit/ Evetta Renner   Chief Complaint  Patient presents with  . Pulmonary Consult    moved from [place], chronic bronchitis, wheezing, dx with asthma in 2000, cold humidity makes it worse  maint on symb 160 2bid and at baseline intermittently noisy breathing one breath q 2-3 days  s pattern though not waking him  noct   Worse since  Jun 22 2016  with uri/ sinus symptoms >  [facility] rx  Amox/only a little better, still severe nasal congestion/severe fits of day > noct cough and subj wheeze.  Typical triggers are uri/not seasonal pattern rhintiis - happens up to 4 x yearly with or without symbicort and just as severe. rec Augmentin 875 mg take one pill twice daily  X 10 days   Please see patient coordinator before you leave today  to schedule CT sinus  in 10 days - no sooner Use nasal saline as much as possible to help moisturize your passages  Continue protonix but take it Take 30-60 min before first meal of the day  Prednisone 10 mg take  4 each am x 2 days,   2 each am x 2 days,  1 each am x 2 days and stop  Work on inhaler technique:  Plan A = Automatic = symbicort 80 Take 2 puffs first thing in am and then another 2 puffs about 12 hours later.  Plan B = Backup Only use your albuterol (Proir) as a rescue medication to be used if you can't catch your breath by resting or doing a relaxed purse lip breathing pattern.  - The less you use it, the better it will work when you need it. - Ok to use the inhaler up to 2 puffs  every 4 hours     07/21/2016  f/u ov/Mcgregor Tinnon re: uacs vs asthma Chief Complaint  Patient  presents with  . Follow-up    Cough is much improved, but has not completely resolved. Breathing has improved.   cough is worse at bedtime and in am p rising but not waking up prematurely and not productive  Bloody nasal d/c / no sob at all  rec Use lots of saline nasal spray on your nose Please remember to go to the x-ray department downstairs for your tests - we will call you with the results when they are available. Ty off the symbicort to see if any worse and if so restart  At 2 pffs every 12 hour schedule ENT  >   [name] rec abx and f/u prn      09/05/2017  f/u ov/Desi Carby re: chronic asthma/? vcd component  Chief Complaint  Patient presents with  . Follow-up    Has noticed wheezing and chest tightness for the past few wks. He has been taking his symbicort in the am only. He uses his albuterol inhaler no more than once per wk.   Dyspnea:  Better when uses symbicort  Cough: none Sleep: rare chest tightness once a month x 20 min  / better p saba  / No  assoc overt hb SABA use: not using when needing symb  rec symbicort increase to 160 Take 2 puffs first thing in am and then another 2 puffs about 12 hours later Work on inhaler technique:     10/03/2017  f/u ov/Hatsue Sime re: chronic asthma  Chief Complaint  Patient presents with  . Follow-up    SOB with exertion or long conversations, no wheezing has random gasps for air   Dyspnea:  Not limited by breathing from desired activities   Cough: no   Sleep: disturbed by back pain  SABA use:  Up to twice daily ? Consistent with symbicort -thinks the 64 works better than the 160  rec Change back to symbicort 80 Take 2 puffs first thing in am and then another 2 puffs about 12 hours later.  Only use your albuterol as a rescue medication Please remember to go to the  x-ray department downstairs in the basement  for your tests - we will call you with the results when they are available. Please schedule a follow up visit in 3 months but call  sooner if needed  - late add consider singulair add next       02/28/2018  f/u ov/Blayre Papania re: chronic asthma  Chief Complaint  Patient presents with  . Follow-up    Breathing is "ok". He states rarely uses his albuterol, but did have to use approx 10 days ago due to chest tightness. He does have slight tightness in his chest today.   Dyspnea:  MMRC1 = can walk nl pace, flat grade, can't hurry or go uphills or steps s sob   Cough: no Sleeping: on side or prone, bed flat SABA use: a few times a week at most 02: none Chest Pain since asthma flare  always circumferential and equal on both sides x decades and most it ever last 5 min never when lying down, last episode 3 weeks prior to OV - never supine  rec Work on inhaler technique:   No change in medications    05/31/2018  f/u ov/Nieves Barberi re: chronic asthma  Chief Complaint  Patient presents with  . Follow-up    Cough x 10 days- green sputum. He gets SOB when he talks alot. He is using his albuterol inhaler. He has been using his albuterol inhaler about once per day since his symptoms started.  Dyspnea:  At baseline Not limited by breathing from desired activities   Cough: acute onset 10 day prior to OV  Day > noct  Sleeping: flat bed/ one pillow SABA use: at most three times in a day    No obvious day to day or daytime variability or assoc excess/ purulent sputum or mucus plugs or hemoptysis or cp or chest tightness, subjective wheeze or overt sinus or hb symptoms.   sleepign as above without nocturnal  or early am exacerbation  of respiratory  c/o's or need for noct saba. Also denies any obvious fluctuation of symptoms with weather or environmental changes or other aggravating or alleviating factors except as outlined above   No unusual exposure hx or h/o childhood pna/ asthma or knowledge of premature birth.  Current Allergies, Complete Past Medical History, Past Surgical History, Family History, and Social History were reviewed in  [employer] record.  ROS  The following are not active complaints unless bolded Hoarseness, sore throat, dysphagia, dental problems, itching, sneezing,  nasal congestion or discharge of excess mucus or purulent secretions, ear ache,   fever, chills, sweats,  unintended wt loss or wt gain, classically pleuritic or exertional cp,  orthopnea pnd or arm/hand swelling  or leg swelling, presyncope, palpitations, abdominal pain, anorexia, nausea, vomiting, diarrhea  or change in bowel habits or change in bladder habits, change in stools or change in urine, dysuria, hematuria,  rash, arthralgias, visual complaints, headache, numbness, weakness or ataxia or problems with walking or coordination,  change in mood or  memory.        Current Meds  Medication Sig  . acyclovir ointment (ZOVIRAX) 5 % Apply 1 application topically every 3 (three) hours. Use for one week as needed for outbreak  . albuterol (PROVENTIL HFA;VENTOLIN HFA) 108 (90 Base) MCG/ACT inhaler INHALE 1-2 PUFFS INTO THE LUNGS EVERY 6 (SIX) HOURS AS NEEDED FOR WHEEZING OR SHORTNESS OF BREATH.  Marland Kitchen alfuzosin (UROXATRAL) 10 MG 24 hr tablet Take 1 tablet (10 mg total) by mouth daily.  Marland Kitchen aspirin EC 81 MG tablet Take 1 tablet (81 mg total) by mouth daily.  Marland Kitchen atorvastatin (LIPITOR) 10 MG tablet Take 1 tablet (10 mg total) by mouth daily.  . budesonide-formoterol (SYMBICORT) 80-4.5 MCG/ACT inhaler Take 2 puffs first thing in am and then another 2 puffs about 12 hours later.  . cyclobenzaprine (FLEXERIL) 10 MG tablet Take 1/2 or 1 twice a day as needed for back pain  . losartan (COZAAR) 100 MG tablet Take 1 tablet (100 mg total) by mouth daily.  . pantoprazole (PROTONIX) 40 MG tablet TAKE 1 TABLET BY MOUTH EVERY DAY  . valACYclovir (VALTREX) 500 MG tablet Take 1 tablet (500 mg total) by mouth daily.                    Objective:   Physical Exam  05/31/2018        02/28/2018          203   10/03/2017         204   09/05/2017        204 07/21/16 196 lb (88.9 kg)  06/29/16 197 lb 12.8 oz (89.7 kg)     Vital signs reviewed - Note on arrival 02 sats  95% on RA   HEENT: nl dentition,  , and oropharynx. Nl external ear canals without cough reflex - mild/moderate bilateral non-specific turbinate edema     NECK :  without JVD/Nodes/TM/ nl carotid upstrokes bilaterally   LUNGS: no acc muscle use,  Nl contour chest which is clear to A and P bilaterally with  Dry sounding cough on forced exp maneuvers   CV:  RRR  no s3 or murmur or increase in P2, and no edema   ABD:  soft and nontender with nl inspiratory excursion in the supine position. No bruits or organomegaly appreciated, bowel sounds nl  MS:  Nl gait/ ext warm without deformities, calf tenderness, cyanosis or clubbing No obvious joint restrictions   SKIN: warm and dry without lesions    NEURO:  alert, approp, nl sensorium with  no motor or cerebellar deficits apparent.                   Assessment:

## 2018-05-31 NOTE — Assessment & Plan Note (Signed)
06/29/2016  After extensive coaching HFA effectiveness =    75% try lower strength of symb - sinus CT p 10 day augmentin  06/29/2016 >>>  CT  Sinus 07/09/2016 mild chronic changes only  - cough better 07/21/2016 > try just on gerd rx/ no more symbicort > flared off symbicort   Of the three most common causes of  Sub-acute / recurrent or chronic cough, only one (GERD)  can actually contribute to/ trigger  the other two (asthma and post nasal drip syndrome)  and perpetuate the cylce of cough.  While not intuitively obvious, many patients with chronic low grade reflux do not cough until there is a primary insult that disturbs the protective epithelial barrier and exposes sensitive nerve endings.   This is typically viral but can due to PNDS and  either may apply here.    >>>   The point is that once this occurs, it is difficult to eliminate the cycle  using anything but a maximally effective acid suppression regimen at least in the short run, accompanied by an appropriate diet to address non acid GERD and control / eliminate the cough itself with mucinex dm if possible and avoid narcotic containing meds   >>> zpak/ prednisone x 6 days for acute flare ? Related to URI?     I had an extended discussion with the patient reviewing all relevant studies completed to date and  lasting 15 to 20 minutes of a 25 minute visit    See device teaching which extended face to face time for this visit.  Each maintenance medication was reviewed in detail including emphasizing most importantly the difference between maintenance and prns and under what circumstances the prns are to be triggered using an action plan format that is not reflected in the computer generated alphabetically organized AVS which I have not found useful in most complex patients, especially with respiratory illnesses  Please see AVS for specific instructions unique to this visit that I personally wrote and verbalized to the the pt in detail and then  reviewed with pt  by my nurse highlighting any  changes in therapy recommended at today's visit to their plan of care.

## 2018-05-31 NOTE — Assessment & Plan Note (Signed)
06/29/2016    reduce symbicort to 80 2 bid  - FENO 07/21/2016  =   24 on symb 80 2bid - Spirometry 07/21/2016  wnl including  Mid flows p am symbicort > try off > flared so restarted  - 09/05/2017    try symb increase to 160  - PFT's  10/03/2017  FEV1 2.57 (88 % ) ratio 67  p 21 % improvement from saba p symb 160 prior to study with DLCO  108 % corrects to 113 % for alv volume  With variable truncation of insp portion of f/v loop and reported better benefit from 80 than 160 strength c/w uacs / vcd component   - 05/31/2018 flare on symb 80 2bid - 05/31/2018  After extensive coaching inhaler device,  effectiveness =    90% from baseline 75% (short ti)    Mild flare likely related to uri and not to lowering dose of symbicort so no change in maint rx needed.

## 2018-05-31 NOTE — Patient Instructions (Signed)
Plan A = Automatic = symbicort 80 Take 2 puffs first thing in am and then another 2 puffs about 12 hours later.    Work on inhaler technique:  relax and gently blow all the way out then take a nice smooth deep breath back in, triggering the inhaler at same time you start breathing in.  Hold for up to 5 seconds if you can. Blow out thru nose. Rinse and gargle with water when done      Plan B = Backup Only use your albuterol inhaler as a rescue medication to be used if you can't catch your breath by resting or doing a relaxed purse lip breathing pattern.  - The less you use it, the better it will work when you need it. - Ok to use the inhaler up to 2 puffs  every 4 hours if you must but call for appointment if use goes up over your usual need - Don't leave home without it !!  (think of it like the spare tire for your car)      For cough/ congestion > mucinex dm 1200 mg every 12 hours as needed  zpak  Prednisone 10 mg take  4 each am x 2 days,   2 each am x 2 days,  1 each am x 2 days and stop   Please schedule a follow up visit in 3 months but call sooner if needed

## 2018-07-02 ENCOUNTER — Other Ambulatory Visit: Payer: Self-pay | Admitting: Family Medicine

## 2018-07-05 ENCOUNTER — Encounter: Payer: Self-pay | Admitting: Medical

## 2018-07-05 ENCOUNTER — Ambulatory Visit (INDEPENDENT_AMBULATORY_CARE_PROVIDER_SITE_OTHER): Payer: Medicare Other | Admitting: Medical

## 2018-07-05 VITALS — BP 142/85 | HR 90 | Temp 98.2°F | Resp 16 | Ht 66.0 in | Wt 203.8 lb

## 2018-07-05 DIAGNOSIS — J0100 Acute maxillary sinusitis, unspecified: Secondary | ICD-10-CM

## 2018-07-05 DIAGNOSIS — R05 Cough: Secondary | ICD-10-CM | POA: Diagnosis not present

## 2018-07-05 DIAGNOSIS — J40 Bronchitis, not specified as acute or chronic: Secondary | ICD-10-CM

## 2018-07-05 DIAGNOSIS — R059 Cough, unspecified: Secondary | ICD-10-CM

## 2018-07-05 LAB — POC INFLUENZA A&B (BINAX/QUICKVUE)
Influenza A, POC: NEGATIVE
Influenza B, POC: NEGATIVE

## 2018-07-05 MED ORDER — AZITHROMYCIN 250 MG PO TABS: ORAL_TABLET | ORAL | 0 refills | Status: DC

## 2018-07-05 MED ORDER — FLUTICASONE PROPIONATE 50 MCG/ACT NA SUSP: 2.0000 | Freq: Every day | NASAL | 1 refills | Status: DC

## 2018-07-05 MED ORDER — BENZONATATE 100 MG PO CAPS: 100.0000 mg | ORAL_CAPSULE | Freq: Three times a day (TID) | ORAL | 0 refills | Status: DC | PRN

## 2018-07-05 MED ORDER — DOXYCYCLINE HYCLATE 100 MG PO TABS: 100.0000 mg | ORAL_TABLET | Freq: Two times a day (BID) | ORAL | 0 refills | Status: DC

## 2018-07-05 NOTE — Progress Notes (Signed)
Subjective:    Patient ID: Joshua [name], male    DOB: [birth year], 69 y.o.   MRN: [id number]  HPI Pt in states on Monday he had some mild  body aches, ha, low grade fever and runny nose. He is coughing during the day. Some sinus pressure. Some productive cough at times.  Pt daughter had flu last week. Pt had some exposure. Pt had flu vaccine this year.     Review of Systems  Constitutional: Positive for fatigue and fever. Negative for chills.       Fatigued on Monday. Pt energy is getting better now.  HENT: Positive for congestion, postnasal drip, sinus pressure and sinus pain. Negative for ear pain and sore throat.   Respiratory: Positive for cough. Negative for shortness of breath and wheezing.   Cardiovascular: Negative for chest pain and palpitations.  Gastrointestinal: Negative for abdominal pain.  Musculoskeletal: Negative for back pain.  Skin: Negative for pallor.  Neurological: Negative for dizziness.  Hematological: Negative for adenopathy. Does not bruise/bleed easily.  Psychiatric/Behavioral: Negative for behavioral problems and confusion. The patient is not nervous/anxious.     Past Medical History:  Diagnosis Date  . Asthma   . BPH (benign prostatic hyperplasia)   . GERD (gastroesophageal reflux disease)   . HLD (hyperlipidemia)   . HSV infection   . Hypertension   . Kidney stones      Social History   Socioeconomic History  . Marital status: Married    Spouse name: Not on file  . Number of children: 2  . Years of education: Not on file  . Highest education level: Not on file  Occupational History  . Occupation: Engineer, maintenance (IT)  Social Needs  . Financial resource strain: Not on file  . Food insecurity:    Worry: Not on file    Inability: Not on file  . Transportation needs:    Medical: Not on file    Non-medical: Not on file  Tobacco Use  . Smoking status: Former Smoker    Packs/day: 0.50    Years: 22.00    Pack years: 11.00    Types:  Cigarettes    Last attempt to quit: 06/28/1993    Years since quitting: 25.0  . Smokeless tobacco: Never Used  Substance and Sexual Activity  . Alcohol use: Yes    Comment: occasionally  . Drug use: No  . Sexual activity: Not on file  Lifestyle  . Physical activity:    Days per week: Not on file    Minutes per session: Not on file  . Stress: Not on file  Relationships  . Social connections:    Talks on phone: Not on file    Gets together: Not on file    Attends religious service: Not on file    Active member of club or organization: Not on file    Attends meetings of clubs or organizations: Not on file    Relationship status: Not on file  . Intimate partner violence:    Fear of current or ex partner: Not on file    Emotionally abused: Not on file    Physically abused: Not on file    Forced sexual activity: Not on file  Other Topics Concern  . Not on file  Social History Narrative  . Not on file    Past Surgical History:  Procedure Laterality Date  . APPENDECTOMY    . BREAST CYST EXCISION Left    benign  . LITHOTRIPSY    .  TONSILLECTOMY      Family History  Problem Relation Age of Onset  . Alzheimer's disease Father   . Kidney Stones Mother   . Colon cancer Paternal Grandfather 49    No Known Allergies  Current Outpatient Medications on File Prior to Visit  Medication Sig Dispense Refill  . acyclovir ointment (ZOVIRAX) 5 % Apply 1 application topically every 3 (three) hours. Use for one week as needed for outbreak 30 g 3  . albuterol (PROVENTIL HFA;VENTOLIN HFA) 108 (90 Base) MCG/ACT inhaler INHALE 1-2 PUFFS INTO THE LUNGS EVERY 6 (SIX) HOURS AS NEEDED FOR WHEEZING OR SHORTNESS OF BREATH. 8.5 Inhaler 2  . alfuzosin (UROXATRAL) 10 MG 24 hr tablet Take 1 tablet (10 mg total) by mouth daily. 90 tablet 3  . aspirin EC 81 MG tablet Take 1 tablet (81 mg total) by mouth daily. 90 tablet 3  . atorvastatin (LIPITOR) 10 MG tablet Take 1 tablet (10 mg total) by mouth daily.  90 tablet 3  . budesonide-formoterol (SYMBICORT) 80-4.5 MCG/ACT inhaler Take 2 puffs first thing in am and then another 2 puffs about 12 hours later. 1 Inhaler 11  . losartan (COZAAR) 100 MG tablet Take 1 tablet (100 mg total) by mouth daily. 90 tablet 3  . pantoprazole (PROTONIX) 40 MG tablet TAKE 1 TABLET BY MOUTH EVERY DAY 90 tablet 2  . valACYclovir (VALTREX) 500 MG tablet Take 1 tablet (500 mg total) by mouth daily. 90 tablet 3   No current facility-administered medications on file prior to visit.     BP (!) 142/85   Pulse 90   Temp 98.2 F (36.8 C) (Oral)   Resp 16   Ht 5\' 6"  (1.676 m)   Wt 203 lb 12.8 oz (92.4 kg)   SpO2 100%   BMI 32.89 kg/m       Objective:   Physical Exam  General  Mental Status - Alert. General Appearance - Well groomed. Not in acute distress.  Skin Rashes- No Rashes.  HEENT Head- Normal. Ear Auditory Canal - Left- Normal. Right - Normal.Tympanic Membrane- Left- Normal. Right- Normal. Eye Sclera/Conjunctiva- Left- Normal. Right- Normal. Nose & Sinuses Nasal Mucosa- Left-  Boggy and Congested. Right-  Boggy and  Congested.Bilateral maxillary and frontal sinus pressure. Mouth & Throat Lips: Upper Lip- Normal: no dryness, cracking, pallor, cyanosis, or vesicular eruption. Lower Lip-Normal: no dryness, cracking, pallor, cyanosis or vesicular eruption. Buccal Mucosa- Bilateral- No Aphthous ulcers. Oropharynx- No Discharge or Erythema. Tonsils: Characteristics- Bilateral- No Erythema or Congestion. Size/Enlargement- Bilateral- No enlargement. Discharge- bilateral-None.  Neck Neck- Supple. No Masses.   Chest and Lung Exam Auscultation: Breath Sounds:-Clear even and unlabored.  Cardiovascular Auscultation:Rythm- Regular, rate and rhythm. Murmurs & Other Heart Sounds:Ausculatation of the heart reveal- No Murmurs.  Lymphatic Head & Neck General Head & Neck Lymphatics: Bilateral: Description- No Localized lymphadenopathy.         Assessment & Plan:   You appear to have bronchitis and sinusitis. Rest hydrate and tylenol for fever. I am prescribing cough medicine benzonatate , and doxycycline antibiotic. For your nasal congestion use you flonase.  You should gradually get better. If not then notify us and would recommend a chest xray.  You do have history of asthma.  Would recommend that you continue your Symbicort and albuterol.  You are getting severe wheezing and having to use albuterol frequently please let us know.  Decided not to use a azithromycin today since he had been on that last month.  Also actually  sent him Flonase nasal spray but you have active prescription presently.  Your flu test was negative today.  Also you had flu vaccine today.  I am not convinced that you have a flu recently.(pt degree of aches did not seem consistent with flu as well. Also past treatment window time frame.)  Follow up in 7-10 days or as needed  [employer], [employer]

## 2018-07-05 NOTE — Patient Instructions (Signed)
You appear to have bronchitis and sinusitis. Rest hydrate and tylenol for fever. I am prescribing cough medicine benzonatate , and doxycycline antibiotic. For your nasal congestion use you flonase.  You should gradually get better. If not then notify us and would recommend a chest xray.  You do have history of asthma.  Would recommend that you continue your Symbicort and albuterol.  You are getting severe wheezing and having to use albuterol frequently please let us know.  Decided not to use a azithromycin today since he had been on that last month.  Also actually sent him Flonase nasal spray but you have active prescription presently.  Your flu test was negative today.  Also you had flu vaccine today.  I am not convinced that you have a flu recently.    Follow up in 7-10 days or as needed

## 2018-07-14 ENCOUNTER — Encounter: Payer: Self-pay | Admitting: Medical

## 2018-07-14 ENCOUNTER — Ambulatory Visit (INDEPENDENT_AMBULATORY_CARE_PROVIDER_SITE_OTHER): Payer: Medicare Other | Admitting: Medical

## 2018-07-14 VITALS — BP 123/67 | HR 97 | Temp 98.6°F | Resp 16 | Ht 66.0 in | Wt 199.8 lb

## 2018-07-14 DIAGNOSIS — R300 Dysuria: Secondary | ICD-10-CM

## 2018-07-14 DIAGNOSIS — M791 Myalgia, unspecified site: Secondary | ICD-10-CM

## 2018-07-14 DIAGNOSIS — R509 Fever, unspecified: Secondary | ICD-10-CM | POA: Diagnosis not present

## 2018-07-14 DIAGNOSIS — R350 Frequency of micturition: Secondary | ICD-10-CM | POA: Diagnosis not present

## 2018-07-14 LAB — POC URINALSYSI DIPSTICK (AUTOMATED)
Bilirubin, UA: NEGATIVE
Glucose, UA: NEGATIVE
Ketones, UA: NEGATIVE
NITRITE UA: POSITIVE
Protein, UA: POSITIVE — AB
Spec Grav, UA: 1.01 (ref 1.010–1.025)
Urobilinogen, UA: NEGATIVE E.U./dL — AB
pH, UA: 7 (ref 5.0–8.0)

## 2018-07-14 MED ORDER — SULFAMETHOXAZOLE-TRIMETHOPRIM 800-160 MG PO TABS: 1.0000 | ORAL_TABLET | Freq: Two times a day (BID) | ORAL | 0 refills | Status: DC

## 2018-07-14 MED ORDER — CEFTRIAXONE SODIUM 1 G IJ SOLR
1.0000 g | Freq: Once | INTRAMUSCULAR | Status: AC
  Administered 2018-07-14: 1 g via INTRAMUSCULAR

## 2018-07-14 NOTE — Patient Instructions (Addendum)
By your history and recent urinary signs and symptoms, I do think you likely have prostatitis.  We ran your urine today in the office and will get a urine culture.  Also will get a CBC and a PSA.(Unfortunately our lab was closed today.  So please get scheduled on the way out to get blood work done Monday morning.)  We gave you Rocephin 1 g injection antibiotic today.  I went ahead and sent him Bactrim DS antibiotic to your pharmacy.  Make sure you hydrate well.  We will update you on final lab results when those are in.  Your flu test was negative.  Follow-up in 7 days or as needed.

## 2018-07-14 NOTE — Progress Notes (Signed)
Subjective:    Patient ID: Joshua [name], male    DOB: [birth year], 68 y.o.   MRN: [id number]  HPI  Pt in states he was doing well from last visit. Pt was seen on 07/05/2017 for bronchitis and sinusitis. He states within 5 days he had resolution of other symptoms but then yesterday go acute illness.  Then last night got onset of ha, body aches and then fatigue.  Pt stats also had painful urination with the above.Last night hurt to urinate. Some dysura this morning. Day before yesterday was urinating frequently.  Pt has known bph in the past.  Last night he had fever and was sweating.  Pt has 5-10 episodes of prostatitis in the past.    Review of Systems  Constitutional: Positive for chills, fatigue and fever.  HENT: Negative for congestion, drooling, mouth sores, sinus pressure, sinus pain, sneezing and sore throat.   Respiratory: Negative for cough, chest tightness, shortness of breath and wheezing.   Cardiovascular: Negative for chest pain and palpitations.  Gastrointestinal: Negative for abdominal pain, diarrhea, nausea and vomiting.  Genitourinary: Positive for dysuria and frequency. Negative for penile pain, testicular pain and urgency.  Musculoskeletal: Positive for myalgias. Negative for back pain.  Skin: Negative for pallor and rash.  Neurological: Negative for dizziness, weakness, light-headedness and numbness.  Hematological: Negative for adenopathy. Does not bruise/bleed easily.  Psychiatric/Behavioral: Negative for sleep disturbance. The patient is not nervous/anxious.     Past Medical History:  Diagnosis Date  . Asthma   . BPH (benign prostatic hyperplasia)   . GERD (gastroesophageal reflux disease)   . HLD (hyperlipidemia)   . HSV infection   . Hypertension   . Kidney stones      Social History   Socioeconomic History  . Marital status: Married    Spouse name: Not on file  . Number of children: 2  . Years of education: Not on file  . Highest education  level: Not on file  Occupational History  . Occupation: Engineer, maintenance (IT)  Social Needs  . Financial resource strain: Not on file  . Food insecurity:    Worry: Not on file    Inability: Not on file  . Transportation needs:    Medical: Not on file    Non-medical: Not on file  Tobacco Use  . Smoking status: Former Smoker    Packs/day: 0.50    Years: 22.00    Pack years: 11.00    Types: Cigarettes    Last attempt to quit: 06/28/1993    Years since quitting: 25.0  . Smokeless tobacco: Never Used  Substance and Sexual Activity  . Alcohol use: Yes    Comment: occasionally  . Drug use: No  . Sexual activity: Not on file  Lifestyle  . Physical activity:    Days per week: Not on file    Minutes per session: Not on file  . Stress: Not on file  Relationships  . Social connections:    Talks on phone: Not on file    Gets together: Not on file    Attends religious service: Not on file    Active member of club or organization: Not on file    Attends meetings of clubs or organizations: Not on file    Relationship status: Not on file  . Intimate partner violence:    Fear of current or ex partner: Not on file    Emotionally abused: Not on file    Physically abused: Not on file  Forced sexual activity: Not on file  Other Topics Concern  . Not on file  Social History Narrative  . Not on file    Past Surgical History:  Procedure Laterality Date  . APPENDECTOMY    . BREAST CYST EXCISION Left    benign  . LITHOTRIPSY    . TONSILLECTOMY      Family History  Problem Relation Age of Onset  . Alzheimer's disease Father   . Kidney Stones Mother   . Colon cancer Paternal Grandfather 3    No Known Allergies  Current Outpatient Medications on File Prior to Visit  Medication Sig Dispense Refill  . acyclovir ointment (ZOVIRAX) 5 % Apply 1 application topically every 3 (three) hours. Use for one week as needed for outbreak 30 g 3  . albuterol (PROVENTIL HFA;VENTOLIN HFA) 108 (90  Base) MCG/ACT inhaler INHALE 1-2 PUFFS INTO THE LUNGS EVERY 6 (SIX) HOURS AS NEEDED FOR WHEEZING OR SHORTNESS OF BREATH. 8.5 Inhaler 2  . alfuzosin (UROXATRAL) 10 MG 24 hr tablet Take 1 tablet (10 mg total) by mouth daily. 90 tablet 3  . aspirin EC 81 MG tablet Take 1 tablet (81 mg total) by mouth daily. 90 tablet 3  . atorvastatin (LIPITOR) 10 MG tablet Take 1 tablet (10 mg total) by mouth daily. 90 tablet 3  . benzonatate (TESSALON) 100 MG capsule Take 1 capsule (100 mg total) by mouth 3 (three) times daily as needed for cough. 30 capsule 0  . budesonide-formoterol (SYMBICORT) 80-4.5 MCG/ACT inhaler Take 2 puffs first thing in am and then another 2 puffs about 12 hours later. 1 Inhaler 11  . doxycycline (VIBRA-TABS) 100 MG tablet Take 1 tablet (100 mg total) by mouth 2 (two) times daily. Can give caps or generic. Dc zpack. 14 tablet 0  . losartan (COZAAR) 100 MG tablet Take 1 tablet (100 mg total) by mouth daily. 90 tablet 3  . pantoprazole (PROTONIX) 40 MG tablet TAKE 1 TABLET BY MOUTH EVERY DAY 90 tablet 2  . valACYclovir (VALTREX) 500 MG tablet Take 1 tablet (500 mg total) by mouth daily. 90 tablet 3   No current facility-administered medications on file prior to visit.     BP 123/67   Pulse 97   Temp 98.6 F (37 C) (Oral)   Resp 16   Ht 5\' 6"  (1.676 m)   Wt 199 lb 12.8 oz (90.6 kg)   SpO2 97%   BMI 32.25 kg/m       Objective:   Physical Exam  General- No acute distress. Pleasant patient. Neck- Full range of motion, no jvd Lungs- Clear, even and unlabored. Heart- regular rate and rhythm. Neurologic- CNII- XII grossly intact.  Back- no cva tenderness Abdomen- soft, nontender, non distended, +bs, no rebound or guarding.         Assessment & Plan:  By your history and recent urinary signs and symptoms, I do think you likely have prostatitis.  We ran your urine today in the office and will get a urine culture.  Also will get a CBC and a PSA.(Unfortunately our lab was  closed today.  So please get scheduled on the way out to get blood work done Monday morning.)  We gave you Rocephin 1 g injection antibiotic today.  I went ahead and sent him Bactrim DS antibiotic to your pharmacy.  Make sure you hydrate well.  We will update you on final lab results when those are in.  Your flu test was negative.  Follow-up  in 7 days or as needed.  Mackie Pai, PA-C

## 2018-07-17 ENCOUNTER — Telehealth: Payer: Self-pay | Admitting: Medical

## 2018-07-17 ENCOUNTER — Other Ambulatory Visit (INDEPENDENT_AMBULATORY_CARE_PROVIDER_SITE_OTHER): Payer: Medicare Other

## 2018-07-17 DIAGNOSIS — R300 Dysuria: Secondary | ICD-10-CM

## 2018-07-17 DIAGNOSIS — R350 Frequency of micturition: Secondary | ICD-10-CM

## 2018-07-17 DIAGNOSIS — R509 Fever, unspecified: Secondary | ICD-10-CM | POA: Diagnosis not present

## 2018-07-17 LAB — PSA: PSA: 5.24 ng/mL — ABNORMAL HIGH (ref 0.10–4.00)

## 2018-07-17 LAB — URINE CULTURE
MICRO NUMBER:: 71066
SPECIMEN QUALITY:: ADEQUATE

## 2018-07-17 LAB — CBC WITH DIFFERENTIAL/PLATELET
BASOS PCT: 0.6 % (ref 0.0–3.0)
Basophils Absolute: 0 10*3/uL (ref 0.0–0.1)
Eosinophils Absolute: 0.2 10*3/uL (ref 0.0–0.7)
Eosinophils Relative: 3.1 % (ref 0.0–5.0)
HCT: 42.4 % (ref 39.0–52.0)
Hemoglobin: 14.4 g/dL (ref 13.0–17.0)
Lymphocytes Relative: 19 % (ref 12.0–46.0)
Lymphs Abs: 1.3 10*3/uL (ref 0.7–4.0)
MCHC: 33.9 g/dL (ref 30.0–36.0)
MCV: 80.8 fl (ref 78.0–100.0)
MONOS PCT: 8.7 % (ref 3.0–12.0)
Monocytes Absolute: 0.6 10*3/uL (ref 0.1–1.0)
Neutro Abs: 4.8 10*3/uL (ref 1.4–7.7)
Neutrophils Relative %: 68.6 % (ref 43.0–77.0)
Platelets: 302 10*3/uL (ref 150.0–400.0)
RBC: 5.25 Mil/uL (ref 4.22–5.81)
RDW: 13.4 % (ref 11.5–15.5)
WBC: 7 10*3/uL (ref 4.0–10.5)

## 2018-07-17 LAB — COMPREHENSIVE METABOLIC PANEL
ALT: 30 U/L (ref 0–53)
AST: 19 U/L (ref 0–37)
Albumin: 3.9 g/dL (ref 3.5–5.2)
Alkaline Phosphatase: 68 U/L (ref 39–117)
BUN: 17 mg/dL (ref 6–23)
CO2: 24 meq/L (ref 19–32)
Calcium: 9 mg/dL (ref 8.4–10.5)
Chloride: 106 mEq/L (ref 96–112)
Creatinine, Ser: 1.17 mg/dL (ref 0.40–1.50)
GFR: 62.26 mL/min (ref >60.00)
Glucose, Bld: 88 mg/dL (ref 70–99)
Potassium: 4.2 mEq/L (ref 3.5–5.1)
Sodium: 140 mEq/L (ref 135–145)
Total Bilirubin: 0.3 mg/dL (ref 0.2–1.2)
Total Protein: 6.6 g/dL (ref 6.0–8.3)

## 2018-07-17 MED ORDER — AMOXICILLIN-POT CLAVULANATE 875-125 MG PO TABS: 1.0000 | ORAL_TABLET | Freq: Two times a day (BID) | ORAL | 0 refills | Status: DC

## 2018-07-17 NOTE — Telephone Encounter (Signed)
Rx augmentin sent to pharmacy.  

## 2018-07-17 NOTE — Addendum Note (Signed)
Addended by: Caffie Pinto on: 07/17/2018 02:21 PM   Modules accepted: Orders

## 2018-07-17 NOTE — Addendum Note (Signed)
Addended by: Caffie Pinto on: 07/17/2018 02:22 PM   Modules accepted: Orders

## 2018-07-18 ENCOUNTER — Encounter: Payer: Self-pay | Admitting: Internal Medicine

## 2018-07-18 ENCOUNTER — Ambulatory Visit (INDEPENDENT_AMBULATORY_CARE_PROVIDER_SITE_OTHER): Payer: Medicare Other | Admitting: Internal Medicine

## 2018-07-18 VITALS — BP 150/90 | HR 86 | Ht 66.0 in | Wt 200.8 lb

## 2018-07-18 DIAGNOSIS — R058 Other specified cough: Secondary | ICD-10-CM

## 2018-07-18 DIAGNOSIS — J4530 Mild persistent asthma, uncomplicated: Secondary | ICD-10-CM | POA: Diagnosis not present

## 2018-07-18 DIAGNOSIS — R05 Cough: Secondary | ICD-10-CM

## 2018-07-18 MED ORDER — PANTOPRAZOLE SODIUM 40 MG PO TBEC: DELAYED_RELEASE_TABLET | ORAL | 2 refills | Status: DC

## 2018-07-18 MED ORDER — PREDNISONE 10 MG PO TABS: ORAL_TABLET | ORAL | 0 refills | Status: DC

## 2018-07-18 MED ORDER — ACETAMINOPHEN-CODEINE #3 300-30 MG PO TABS: 1.0000 | ORAL_TABLET | ORAL | 0 refills | Status: AC | PRN

## 2018-07-18 MED ORDER — MONTELUKAST SODIUM 10 MG PO TABS: 10.0000 mg | ORAL_TABLET | Freq: Every day | ORAL | 11 refills | Status: DC

## 2018-07-18 NOTE — Progress Notes (Addendum)
Subjective:    Patient ID: Joshua [name], male   DOB: 11/11/1950    MRN: [id number]    Brief patient profile:  68 yo [name] male  with bad bronchitis around 1993 and quit smoking and then  all better until around 2000 while living in [place] with intermittent "wheezing" dx as asthma > never resolved  Self referred for dtc asthma  Allergy testing in [place] around age 58 = neg per pt    History of Present Illness  06/29/2016 1st [facility] office visit/    Chief Complaint  Patient presents with  . Pulmonary Consult    moved from [place], chronic bronchitis, wheezing, dx with asthma in 2000, cold humidity makes it worse  maint on symb 160 2bid and at baseline intermittently noisy breathing one breath q 2-3 days  s pattern though not waking him  noct   Worse since  Jun 22 2016  with uri/ sinus symptoms >  [facility] rx  Amox/only a little better, still severe nasal congestion/severe fits of day > noct cough and subj wheeze.  Typical triggers are uri/not seasonal pattern rhintiis - happens up to 4 x yearly with or without symbicort and just as severe. rec Augmentin 875 mg take one pill twice daily  X 10 days   Please see patient coordinator before you leave today  to schedule [place] sinus  in 10 days - no sooner Use nasal saline as much as possible to help moisturize your passages  Continue protonix but take it Take 30-60 min before first meal of the day  Prednisone 10 mg take  4 each am x 2 days,   2 each am x 2 days,  1 each am x 2 days and stop  Work on inhaler technique:  Plan A = Automatic = symbicort 80 Take 2 puffs first thing in am and then another 2 puffs about 12 hours later.  Plan B = Backup Only use your albuterol (Proir) as a rescue medication to be used if you can't catch your breath by resting or doing a relaxed purse lip breathing pattern.  - The less you use it, the better it will work when you need it. - Ok to use the inhaler up to 2 puffs  every 4 hours     07/21/2016  f/u  ov/ re: uacs vs asthma Chief Complaint  Patient presents with  . Follow-up    Cough is much improved, but has not completely resolved. Breathing has improved.   cough is worse at bedtime and in am p rising but not waking up prematurely and not productive  Bloody nasal d/c / no sob at all  rec Use lots of saline nasal spray on your nose Please remember to go to the x-ray department downstairs for your tests - we will call you with the results when they are available. Ty off the symbicort to see if any worse and if so restart  At 2 pffs every 12 hour schedule ENT  >   [name] rec abx and f/u prn      09/05/2017  f/u ov/ re: chronic asthma/? vcd component  Chief Complaint  Patient presents with  . Follow-up    Has noticed wheezing and chest tightness for the past few wks. He has been taking his symbicort in the am only. He uses his albuterol inhaler no more than once per wk.   Dyspnea:  Better when uses symbicort  Cough: none Sleep: rare chest tightness once a  month x 20 min  / better p saba  / No assoc overt hb SABA use: not using when needing symb  rec symbicort increase to 160 Take 2 puffs first thing in am and then another 2 puffs about 12 hours later Work on inhaler technique:     10/03/2017  f/u ov/ re: chronic asthma  Chief Complaint  Patient presents with  . Follow-up    SOB with exertion or long conversations, no wheezing has random gasps for air   Dyspnea:  Not limited by breathing from desired activities   Cough: no   Sleep: disturbed by back pain  SABA use:  Up to twice daily ? Consistent with symbicort -thinks the 43 works better than the 160  rec Change back to symbicort 80 Take 2 puffs first thing in am and then another 2 puffs about 12 hours later.  Only use your albuterol as a rescue medication   - late add consider singulair add next     07/18/2018 acute extended ov/ re:  Cough flare omaint rx with syb 80 and ppi q d at [place]  Patient presents with  . Acute Visit    cough with greenish mucus, SOB with exertion,    maint on symb80 2bid/ protonix 40 mg not ac and not needing overall doing well  but  never really cleared the afteroon dry cough  then much worse w/in a week of arriving back p  international travel  2 prior to OV  much worse cough assoc with nasal congestion/ slt bloody dc/ on flonase and sense of doe not better with saba but comfortable at rest sitting if not coughing  Just started on augmentin  07/14/18 for uti by pcp no better yet   No obvious day to day or daytime variability or assoc   mucus plugs or hemoptysis or cp or chest tightness, subjective wheeze or overt  hb symptoms.     Also denies any obvious fluctuation of symptoms with weather or environmental changes or other aggravating or alleviating factors except as outlined above   No unusual exposure hx or h/o childhood pna/ asthma or knowledge of premature birth.  Current Allergies, Complete Past Medical History, Past Surgical History, Family History, and Social History were reviewed in [employer] record.  ROS  The following are not active complaints unless bolded Hoarseness, sore throat, dysphagia, dental problems, itching, sneezing,  nasal congestion or discharge of excess mucus or purulent secretions, ear ache,   fever, chills, sweats, unintended wt loss or wt gain, classically pleuritic or exertional cp,  orthopnea pnd or arm/hand swelling  or leg swelling, presyncope, palpitations, abdominal pain, anorexia, nausea, vomiting, diarrhea  or change in bowel habits or change in bladder habits, change in stools or change in urine, dysuria, hematuria,  rash, arthralgias, visual complaints, headache, numbness, weakness or ataxia or problems with walking or coordination,  change in mood or  memory.        Current Meds  Medication Sig  . acyclovir ointment (ZOVIRAX) 5 % Apply 1 application topically every 3 (three)  hours. Use for one week as needed for outbreak  . albuterol (PROVENTIL HFA;VENTOLIN HFA) 108 (90 Base) MCG/ACT inhaler INHALE 1-2 PUFFS INTO THE LUNGS EVERY 6 (SIX) HOURS AS NEEDED FOR WHEEZING OR SHORTNESS OF BREATH.  Marland Kitchen alfuzosin (UROXATRAL) 10 MG 24 hr tablet Take 1 tablet (10 mg total) by mouth daily.  Marland Kitchen amoxicillin-clavulanate (AUGMENTIN) 875-125 MG tablet Take 1 tablet  by mouth 2 (two) times daily.  Marland Kitchen aspirin EC 81 MG tablet Take 1 tablet (81 mg total) by mouth daily.  . budesonide-formoterol (SYMBICORT) 80-4.5 MCG/ACT inhaler Take 2 puffs first thing in am and then another 2 puffs about 12 hours later.  Marland Kitchen losartan (COZAAR) 100 MG tablet Take 1 tablet (100 mg total) by mouth daily.  . pantoprazole (PROTONIX) 40 MG tablet TAKE 1 TABLET BY MOUTH EVERY DAY  . valACYclovir (VALTREX) 500 MG tablet Take 1 tablet (500 mg total) by mouth daily.  .                   Objective:   Physical Exam  amb wm with very harsh barking quality upper airway cough and prominent pseudowheeze   07/18/2018       200     02/28/2018          203   10/03/2017         204   09/05/2017       204 07/21/16 196 lb (88.9 kg)  06/29/16 197 lb 12.8 oz (89.7 kg)    Vital signs reviewed - Note on arrival 02 sats  95% on RA      HEENT: top dentures/ nl oropharynx. Nl external ear canals without cough reflex Mild/mod bilateral non-specific turbinate edema  / some crusting    NECK :  without JVD/Nodes/TM/ nl carotid upstrokes bilaterally   LUNGS: no acc muscle use,  Nl contour chest which is clear to A and P bilaterally without cough on insp or exp maneuvers- transmitted pseudowheeze only    CV:  RRR  no s3 or murmur or increase in P2, and no edema   ABD:  soft and nontender with nl inspiratory excursion in the supine position. No bruits or organomegaly appreciated, bowel sounds nl  MS:  Nl gait/ ext warm without deformities, calf tenderness, cyanosis or clubbing No obvious joint restrictions   SKIN: warm and  dry without lesions    NEURO:  alert, approp, nl sensorium with  no motor or cerebellar deficits apparent.          Assessment:

## 2018-07-18 NOTE — Patient Instructions (Addendum)
Add singulair 10 mg one each pm   When coughing at all > protonix Take 30- 60 min before your first and last meals of the day   Finish Augmentin 875 mg take one pill twice daily  - take at breakfast and supper with large glass of water.  It would help reduce the usual side effects (diarrhea and yeast infections) if you ate cultured yogurt at lunch.    GERD (REFLUX)  is an extremely common cause of respiratory symptoms just like yours , many times with no obvious heartburn at all.    It can be treated with medication, but also with lifestyle changes including elevation of the head of your bed (ideally with 6 -8inch blocks under the headboard of your bed),  Smoking cessation, avoidance of late meals, excessive alcohol, and avoid fatty foods, chocolate, peppermint, colas, red wine, and acidic juices such as orange juice.  NO MINT OR MENTHOL PRODUCTS SO NO COUGH DROPS  USE SUGARLESS CANDY INSTEAD (Jolley ranchers or Stover's or Life Savers) or even ice chips will also do - the key is to swallow to prevent all throat clearing. NO OIL BASED VITAMINS - use powdered substitutes.  Avoid fish oil when coughing.    Prednisone 10 mg take  4 each am x 2 days,   2 each am x 2 days,  1 each am x 2 days and stop    Take delsym two tsp every 12 hours and supplement if needed with  Tylenol #3 up to 1 every 4 hours to suppress the urge to cough. Swallowing water and/or using ice chips/non mint and menthol containing candies (such as lifesavers or sugarless jolly ranchers) are also effective.  You should rest your voice and avoid activities that you know make you cough.  Once you have eliminated the cough for 3 straight days try reducing the tylenol #3 first,  then the delsym as tolerated.     Keep your previous appt - call in meantime if needed

## 2018-07-19 ENCOUNTER — Encounter: Payer: Self-pay | Admitting: Internal Medicine

## 2018-07-19 NOTE — Assessment & Plan Note (Addendum)
Onset 05/2016  - sinus CT p 10 day augmentin  06/29/2016 >>>  CT  Sinus 07/09/2016 mild chronic changes only  - cough better 07/21/2016 > try just on gerd rx/ no more symbicort > flared off symbicort  eval by [place] ent 07/26/16  Nordbladh, PA > rec levaquin  500 x  10 days and f/u prn  - severe flare 07/18/2018 p uri/? Sinusitis > augmentin /cyclical cough rx   Of the three most common causes of  Sub-acute / recurrent or chronic cough, only one (GERD)  can actually contribute to/ trigger  the other two (asthma and post nasal drip syndrome)  and perpetuate the cylce of cough.  While not intuitively obvious, many patients with chronic low grade reflux do not cough until there is a primary insult that disturbs the protective epithelial barrier and exposes sensitive nerve endings.   This is typically viral but can due to PNDS and  either may apply here.   The point is that once this occurs, it is difficult to eliminate the cycle  using anything but a maximally effective acid suppression regimen at least in the short run, accompanied by an appropriate diet to address non acid GERD and control / eliminate the cough itself for at least 3 days with tylenol #3 with 6 days of pred to cover any upper airway inflammatory component to the problem.   If not better return in 2 weeks ? Refer back to ent if sinus issues not resolved rather than prolong abx risk      I had an extended discussion with the patient reviewing all relevant studies completed to date and  lasting 15 to 20 minutes of a 25 minute acute office visit    Each maintenance medication was reviewed in detail including most importantly the difference between maintenance and prns and under what circumstances the prns are to be triggered using an action plan format that is not reflected in the computer generated alphabetically organized AVS.     Please see AVS for specific instructions unique to this visit that I personally wrote and verbalized to the the  pt in detail and then reviewed with pt  by my nurse highlighting any  changes in therapy recommended at today's visit to their plan of care.

## 2018-07-19 NOTE — Assessment & Plan Note (Signed)
Onset around 2000 in [place] with allergy testing neg there 06/29/2016    reduce symbicort to 80 2 bid  - FENO 07/21/2016  =   24 on symb 80 2bid - Spirometry 07/21/2016  wnl including  Mid flows p am symbicort > try off > flared so restarted  - 09/05/2017    try symb increase to 160  - PFT's  10/03/2017  FEV1 2.57 (88 % ) ratio 67  p 21 % improvement from saba p symb 160 prior to study with DLCO  108 % corrects to 113 % for alv volume  With variable truncation of insp portion of f/v loop and reported better benefit from 80 than 160 strength c/w uacs / vcd component  - 05/31/2018 flare on symb 80 2bid - 05/31/2018  After extensive coaching inhaler device,  effectiveness =    90% from baseline 75% (short ti)     Despite classic trigger (uri with likely rhinitis/ sinusitis and severe cough) he is not wheezing at all and symb 80 good choice here as tendency to cough.  Since also has tendency to rhinitis try adding singulair to cover both.

## 2018-07-24 ENCOUNTER — Encounter: Payer: Self-pay | Admitting: Medical

## 2018-07-26 ENCOUNTER — Encounter: Payer: Self-pay | Admitting: Medical

## 2018-07-26 ENCOUNTER — Ambulatory Visit: Payer: Medicare Other | Admitting: Medical

## 2018-07-26 ENCOUNTER — Ambulatory Visit (INDEPENDENT_AMBULATORY_CARE_PROVIDER_SITE_OTHER): Payer: Medicare Other | Admitting: Medical

## 2018-07-26 ENCOUNTER — Encounter

## 2018-07-26 VITALS — BP 126/81 | HR 94 | Temp 98.1°F | Resp 16 | Ht 66.0 in | Wt 201.0 lb

## 2018-07-26 DIAGNOSIS — N401 Enlarged prostate with lower urinary tract symptoms: Secondary | ICD-10-CM | POA: Diagnosis not present

## 2018-07-26 DIAGNOSIS — N390 Urinary tract infection, site not specified: Secondary | ICD-10-CM | POA: Diagnosis not present

## 2018-07-26 DIAGNOSIS — N410 Acute prostatitis: Secondary | ICD-10-CM | POA: Diagnosis not present

## 2018-07-26 DIAGNOSIS — R350 Frequency of micturition: Secondary | ICD-10-CM

## 2018-07-26 DIAGNOSIS — Z87440 Personal history of urinary (tract) infections: Secondary | ICD-10-CM | POA: Diagnosis not present

## 2018-07-26 LAB — POC URINALSYSI DIPSTICK (AUTOMATED)
Bilirubin, UA: NEGATIVE
Blood, UA: NEGATIVE
Glucose, UA: NEGATIVE
Ketones, UA: NEGATIVE
Leukocytes, UA: NEGATIVE
Nitrite, UA: NEGATIVE
PH UA: 6.5 (ref 5.0–8.0)
Protein, UA: NEGATIVE
Spec Grav, UA: 1.015 (ref 1.010–1.025)
Urobilinogen, UA: NEGATIVE E.U./dL — AB

## 2018-07-26 LAB — PSA: PSA: 2.54 ng/mL (ref 0.10–4.00)

## 2018-07-26 MED ORDER — LEVOFLOXACIN 500 MG PO TABS: 500.0000 mg | ORAL_TABLET | Freq: Every day | ORAL | 0 refills | Status: DC

## 2018-07-26 NOTE — Patient Instructions (Signed)
For your persisting prostatitis and uti, I am prescribing levofloxin antibiotic. Repeating urine culture and getting psa. Placing referral to urologist.  Would also recommend calling your urologist and try to get in within 2 weeks. If not scheduled within 2 weeks please let me know.  Follow up as needed

## 2018-07-26 NOTE — Progress Notes (Signed)
Subjective:    Patient ID: Joshua [name], male    DOB: 12-Feb-1951, 68 y.o.   MRN: [id number]  HPI  Pt in with some persisting frequent urination with some suprapubic tenderness over past 2 days. He states he got better with injection and augmentin.(psa was elevated and culture came back showing uti) Originally bactrim showed resistance on sensitivity report so switched to augmentin per sensitivity.   He states felt well until 2 days when symptoms feel like they are returning.  Pt in past used levofloxin.    Review of Systems  Constitutional: Negative for chills, fatigue and fever.  Respiratory: Negative for cough, chest tightness, shortness of breath and wheezing.   Cardiovascular: Negative for chest pain and palpitations.  Gastrointestinal: Negative for abdominal pain, constipation and diarrhea.       Suprapubic pressure.  Genitourinary: Positive for dysuria and frequency. Negative for discharge, penile swelling, scrotal swelling and testicular pain.  Musculoskeletal: Negative for back pain, gait problem, neck pain and neck stiffness.  Skin: Negative for rash.  Neurological: Negative for dizziness and headaches.  Hematological: Negative for adenopathy. Does not bruise/bleed easily.  Psychiatric/Behavioral: The patient is not nervous/anxious.     Past Medical History:  Diagnosis Date  . Asthma   . BPH (benign prostatic hyperplasia)   . GERD (gastroesophageal reflux disease)   . HLD (hyperlipidemia)   . HSV infection   . Hypertension   . Kidney stones      Social History   Socioeconomic History  . Marital status: Married    Spouse name: Not on file  . Number of children: 2  . Years of education: Not on file  . Highest education level: Not on file  Occupational History  . Occupation: Engineer, maintenance (IT)  Social Needs  . Financial resource strain: Not on file  . Food insecurity:    Worry: Not on file    Inability: Not on file  . Transportation needs:    Medical: Not on  file    Non-medical: Not on file  Tobacco Use  . Smoking status: Former Smoker    Packs/day: 0.50    Years: 22.00    Pack years: 11.00    Types: Cigarettes    Last attempt to quit: 06/28/1993    Years since quitting: 25.0  . Smokeless tobacco: Never Used  Substance and Sexual Activity  . Alcohol use: Yes    Comment: occasionally  . Drug use: No  . Sexual activity: Not on file  Lifestyle  . Physical activity:    Days per week: Not on file    Minutes per session: Not on file  . Stress: Not on file  Relationships  . Social connections:    Talks on phone: Not on file    Gets together: Not on file    Attends religious service: Not on file    Active member of club or organization: Not on file    Attends meetings of clubs or organizations: Not on file    Relationship status: Not on file  . Intimate partner violence:    Fear of current or ex partner: Not on file    Emotionally abused: Not on file    Physically abused: Not on file    Forced sexual activity: Not on file  Other Topics Concern  . Not on file  Social History Narrative  . Not on file    Past Surgical History:  Procedure Laterality Date  . APPENDECTOMY    . BREAST CYST  EXCISION Left    benign  . LITHOTRIPSY    . TONSILLECTOMY      Family History  Problem Relation Age of Onset  . Alzheimer's disease Father   . Kidney Stones Mother   . Colon cancer Paternal Grandfather 33    No Known Allergies  Current Outpatient Medications on File Prior to Visit  Medication Sig Dispense Refill  . acyclovir ointment (ZOVIRAX) 5 % Apply 1 application topically every 3 (three) hours. Use for one week as needed for outbreak 30 g 3  . albuterol (PROVENTIL HFA;VENTOLIN HFA) 108 (90 Base) MCG/ACT inhaler INHALE 1-2 PUFFS INTO THE LUNGS EVERY 6 (SIX) HOURS AS NEEDED FOR WHEEZING OR SHORTNESS OF BREATH. 8.5 Inhaler 2  . alfuzosin (UROXATRAL) 10 MG 24 hr tablet Take 1 tablet (10 mg total) by mouth daily. 90 tablet 3  . aspirin EC  81 MG tablet Take 1 tablet (81 mg total) by mouth daily. 90 tablet 3  . budesonide-formoterol (SYMBICORT) 80-4.5 MCG/ACT inhaler Take 2 puffs first thing in am and then another 2 puffs about 12 hours later. 1 Inhaler 11  . losartan (COZAAR) 100 MG tablet Take 1 tablet (100 mg total) by mouth daily. 90 tablet 3  . montelukast (SINGULAIR) 10 MG tablet Take 1 tablet (10 mg total) by mouth at bedtime. 30 tablet 11  . pantoprazole (PROTONIX) 40 MG tablet Take 30- 60 min before your first and last meals of the day 60 tablet 2  . valACYclovir (VALTREX) 500 MG tablet Take 1 tablet (500 mg total) by mouth daily. 90 tablet 3  . atorvastatin (LIPITOR) 10 MG tablet Take 1 tablet (10 mg total) by mouth daily. 90 tablet 3   No current facility-administered medications on file prior to visit.     BP 126/81   Pulse 94   Temp 98.1 F (36.7 C) (Oral)   Resp 16   Ht 5\' 6"  (1.676 m)   Wt 201 lb (91.2 kg)   SpO2 98%   BMI 32.44 kg/m       Objective:   Physical Exam  General- No acute distress. Pleasant patient. Neck- Full range of motion, no jvd Lungs- Clear, even and unlabored. Heart- regular rate and rhythm. Neurologic- CNII- XII grossly intact.  Abdomen- soft, nt, nd, +bs,no rebound or guarding.     Assessment & Plan:  For your persisting prostatitis and uti, I am prescribing levofloxin antibiotic. Repeating urine culture and getting psa. Placing referral to urologist.  Would also recommend calling your urologist and try to get in within 2 weeks. If not scheduled within 2 weeks please let me know.  Follow up as needed  Mackie Pai, PA-C

## 2018-07-27 LAB — URINE CULTURE
MICRO NUMBER:: 121671
Result:: NO GROWTH
SPECIMEN QUALITY:: ADEQUATE

## 2018-08-30 ENCOUNTER — Ambulatory Visit: Payer: Medicare Other | Admitting: Internal Medicine

## 2018-09-01 ENCOUNTER — Encounter: Payer: Self-pay | Admitting: Internal Medicine

## 2018-09-01 ENCOUNTER — Ambulatory Visit (INDEPENDENT_AMBULATORY_CARE_PROVIDER_SITE_OTHER): Payer: Medicare Other | Admitting: Internal Medicine

## 2018-09-01 VITALS — BP 126/70 | HR 83 | Ht 66.0 in | Wt 205.0 lb

## 2018-09-01 DIAGNOSIS — J4530 Mild persistent asthma, uncomplicated: Secondary | ICD-10-CM | POA: Diagnosis not present

## 2018-09-01 DIAGNOSIS — R05 Cough: Secondary | ICD-10-CM | POA: Diagnosis not present

## 2018-09-01 DIAGNOSIS — R058 Other specified cough: Secondary | ICD-10-CM

## 2018-09-01 NOTE — Progress Notes (Signed)
Subjective:    Patient ID: Joshua [name], male   DOB: [birth year]    MRN: [id number]    Brief patient profile:  68 yo [place] male  with bad bronchitis around 1993 and quit smoking and then  all better until around 2000 while living in [place] with intermittent "wheezing" dx as asthma > never resolved  Self referred for dtc asthma  Allergy testing in [place] around age 45 = neg per pt    History of Present Illness  06/29/2016 1st [facility] office visit/ Donny Heffern   Chief Complaint  Patient presents with  . Pulmonary Consult    moved from [place], chronic bronchitis, wheezing, dx with asthma in 2000, cold humidity makes it worse  maint on symb 160 2bid and at baseline intermittently noisy breathing one breath q 2-3 days  s pattern though not waking him  noct   Worse since  Jun 22 2016  with uri/ sinus symptoms >  [facility] rx  Amox/only a little better, still severe nasal congestion/severe fits of day > noct cough and subj wheeze.  Typical triggers are uri/not seasonal pattern rhintiis - happens up to 4 x yearly with or without symbicort and just as severe. rec Augmentin 875 mg take one pill twice daily  X 10 days   Please see patient coordinator before you leave today  to schedule CT sinus  in 10 days - no sooner Use nasal saline as much as possible to help moisturize your passages  Continue protonix but take it Take 30-60 min before first meal of the day  Prednisone 10 mg take  4 each am x 2 days,   2 each am x 2 days,  1 each am x 2 days and stop  Work on inhaler technique:  Plan A = Automatic = symbicort 80 Take 2 puffs first thing in am and then another 2 puffs about 12 hours later.  Plan B = Backup Only use your albuterol (Proir) as a rescue medication to be used if you can't catch your breath by resting or doing a relaxed purse lip breathing pattern.  - The less you use it, the better it will work when you need it. - Ok to use the inhaler up to 2 puffs  every 4 hours     07/21/2016  f/u  ov/Tomy Khim re: uacs vs asthma Chief Complaint  Patient presents with  . Follow-up    Cough is much improved, but has not completely resolved. Breathing has improved.   cough is worse at bedtime and in am p rising but not waking up prematurely and not productive  Bloody nasal d/c / no sob at all  rec Use lots of saline nasal spray on your nose Please remember to go to the x-ray department downstairs for your tests - we will call you with the results when they are available. Ty off the symbicort to see if any worse and if so restart  At 2 pffs every 12 hour schedule ENT  >   [name] rec abx and f/u prn      09/05/2017  f/u ov/Zaheer Wageman re: chronic asthma/? vcd component  Chief Complaint  Patient presents with  . Follow-up    Has noticed wheezing and chest tightness for the past few wks. He has been taking his symbicort in the am only. He uses his albuterol inhaler no more than once per wk.   Dyspnea:  Better when uses symbicort  Cough: none Sleep: rare chest tightness once a  month x 20 min  / better p saba  / No assoc overt hb SABA use: not using when needing symb  rec symbicort increase to 160 Take 2 puffs first thing in am and then another 2 puffs about 12 hours later Work on inhaler technique:     10/03/2017  f/u ov/Navah Grondin re: chronic asthma  Chief Complaint  Patient presents with  . Follow-up    SOB with exertion or long conversations, no wheezing has random gasps for air   Dyspnea:  Not limited by breathing from desired activities   Cough: no   Sleep: disturbed by back pain  SABA use:  Up to twice daily ? Consistent with symbicort -thinks the 67 works better than the 160  rec Change back to symbicort 80 Take 2 puffs first thing in am and then another 2 puffs about 12 hours later.  Only use your albuterol as a rescue medication   - late add consider singulair add next     07/18/2018 acute extended ov/Brynnan Rodenbaugh re:  Cough flare on maint rx with syb 80 and ppi q d at [facility]  Patient presents with  . Acute Visit    cough with greenish mucus, SOB with exertion,    maint on symb80 2bid/ protonix 40 mg not ac and not needing overall doing well  but  never really cleared the afteroon dry cough  then much worse w/in a week of arriving back p  international travel  2 prior to OV  much worse cough assoc with nasal congestion/ slt bloody dc/ on flonase and sense of doe not better with saba but comfortable at rest sitting if not coughing Just started on augmentin  07/14/18 for uti by pcp no better yet  rec Add singulair 10 mg one each pm  When coughing at all > protonix Take 30- 60 min before your first and last meals of the day  Finish Augmentin 875 mg take one pill twice daily  GERD   Prednisone 10 mg take  4 each am x 2 days,   2 each am x 2 days,  1 each am x 2 days and stop  Take delsym two tsp every 12 hours and supplement if needed with  Tylenol #3 up to 1 every 4 hours to suppress the urge to cough. Swallowing water and/or using ice chips/non mint and menthol containing candies (such as lifesavers or sugarless jolly ranchers) are also effective.  You should rest your voice and avoid activities that you know make you cough. Once you have eliminated the cough for 3 straight days try reducing the tylenol #3 first,  then the delsym as tolerated.     09/01/2018  f/u ov/Statia Burdick re: asthma with uacs component on symb 80 2bid  Chief Complaint  Patient presents with  . Follow-up    Coughing less but still coughs if he talks alot. He is using his albuterol inhaler 3-4 x per wk.   Dyspnea:  MMRC1 = can walk nl pace, flat grade, can't hurry or go uphills or steps s sob   Cough: with voice use  Sleeping: no cough/ wheeze SABA use: as above / seems to helpd 02:  None    No obvious day to day or daytime variability or assoc excess/ purulent sputum or mucus plugs or hemoptysis or cp or chest tightness, subjective wheeze or overt sinus or hb symptoms.   Sleeping as above  flate  without nocturnal  or early am  exacerbation  of respiratory  c/o's or need for noct saba. Also denies any obvious fluctuation of symptoms with weather or environmental changes or other aggravating or alleviating factors except as outlined above   No unusual exposure hx or h/o childhood pna/ asthma or knowledge of premature birth.  Current Allergies, Complete Past Medical History, Past Surgical History, Family History, and Social History were reviewed in [employer] record.  ROS  The following are not active complaints unless bolded Hoarseness, sore throat, dysphagia, dental problems, itching, sneezing,  nasal congestion or discharge of excess mucus or purulent secretions, ear ache,   fever, chills, sweats, unintended wt loss or wt gain, classically pleuritic or exertional cp,  orthopnea pnd or arm/hand swelling  or leg swelling, presyncope, palpitations, abdominal pain, anorexia, nausea, vomiting, diarrhea  or change in bowel habits or change in bladder habits, change in stools or change in urine, dysuria, hematuria,  rash, arthralgias, visual complaints, headache, numbness, weakness or ataxia or problems with walking or coordination,  change in mood or  memory.        Current Meds  Medication Sig  . acyclovir ointment (ZOVIRAX) 5 % Apply 1 application topically every 3 (three) hours. Use for one week as needed for outbreak  . albuterol (PROVENTIL HFA;VENTOLIN HFA) 108 (90 Base) MCG/ACT inhaler INHALE 1-2 PUFFS INTO THE LUNGS EVERY 6 (SIX) HOURS AS NEEDED FOR WHEEZING OR SHORTNESS OF BREATH.  Marland Kitchen alfuzosin (UROXATRAL) 10 MG 24 hr tablet Take 1 tablet (10 mg total) by mouth daily.  Marland Kitchen aspirin EC 81 MG tablet Take 1 tablet (81 mg total) by mouth daily.  . budesonide-formoterol (SYMBICORT) 80-4.5 MCG/ACT inhaler Take 2 puffs first thing in am and then another 2 puffs about 12 hours later.  Marland Kitchen losartan (COZAAR) 100 MG tablet Take 1 tablet (100 mg total) by mouth daily.  .  montelukast (SINGULAIR) 10 MG tablet Take 1 tablet (10 mg total) by mouth at bedtime.  . pantoprazole (PROTONIX) 40 MG tablet Take 30- 60 min before your first and last meals of the day  . valACYclovir (VALTREX) 500 MG tablet Take 1 tablet (500 mg total) by mouth daily.                        Objective:   Physical Exam  amb wm nad   09/01/2018         205  07/18/2018       200     02/28/2018          203   10/03/2017         204   09/05/2017       204 07/21/16 196 lb (88.9 kg)  06/29/16 197 lb 12.8 oz (89.7 kg)    Vital signs reviewed - Note on arrival 02 sats  97% on RA       HEENT: top dentures/ nl oropharynx. Nl external ear canals without cough reflex Mild/mod bilateral non-specific turbinate edema  / some crusting  - transmitted pseudowheeze only    HEENT: Top dentures, nl turbinates bilaterally, and oropharynx. Nl external ear canals without cough reflex   NECK :  without JVD/Nodes/TM/ nl carotid upstrokes bilaterally   LUNGS: no acc muscle use,  Nl contour chest which is clear to A and P bilaterally with mostly transmitted noise from upper airway  without cough on insp or exp maneuvers   CV:  RRR  no s3 or murmur or increase in P2,  and no edema   ABD:  soft and nontender with nl inspiratory excursion in the supine position. No bruits or organomegaly appreciated, bowel sounds nl  MS:  Nl gait/ ext warm without deformities, calf tenderness, cyanosis or clubbing No obvious joint restrictions   SKIN: warm and dry without lesions    NEURO:  alert, approp, nl sensorium with  no motor or cerebellar deficits apparent.            Assessment:

## 2018-09-01 NOTE — Patient Instructions (Signed)
Try symbicort 80 one twice daily to see if throat is better or breathing is worse  If not better, call me for ent referral   Please schedule a follow up visit in 3 months but call sooner if needed

## 2018-09-03 ENCOUNTER — Encounter: Payer: Self-pay | Admitting: Internal Medicine

## 2018-09-03 NOTE — Assessment & Plan Note (Signed)
Onset 05/2016  - sinus CT p 10 day augmentin  06/29/2016 >>>  CT  Sinus 07/09/2016 mild chronic changes only  - cough better 07/21/2016 > try just on gerd rx/ no more symbicort > flared off symbicort  eval by [place] ent 07/26/16  Nordbladh, PA > rec levaquin  500 x  10 days and f/u prn  - severe flare 07/18/2018 p uri/? Sinusitis > augmentin /cyclical cough rx> 06/28/313 improved x for hoarseness and cough with voice use  rec lower ICS if tol and continue singulair daily >>>  if this doesn't correct the problem needs ent eval   I had an extended discussion with the patient reviewing all relevant studies completed to date and  lasting 15 to 20 minutes of a 25 minute visit    See device teaching which extended face to face time for this visit.  Each maintenance medication was reviewed in detail including emphasizing most importantly the difference between maintenance and prns and under what circumstances the prns are to be triggered using an action plan format that is not reflected in the computer generated alphabetically organized AVS which I have not found useful in most complex patients, especially with respiratory illnesses  Please see AVS for specific instructions unique to this visit that I personally wrote and verbalized to the the pt in detail and then reviewed with pt  by my nurse highlighting any  changes in therapy recommended at today's visit to their plan of care.

## 2018-09-03 NOTE — Assessment & Plan Note (Signed)
Onset around 2000 in [place] with allergy testing neg there 06/29/2016    reduce symbicort to 80 2 bid  - FENO 07/21/2016  =   24 on symb 80 2bid - Spirometry 07/21/2016  wnl including  Mid flows p am symbicort > try off > flared so restarted  - 09/05/2017    try symb increase to 160  - PFT's  10/03/2017  FEV1 2.57 (88 % ) ratio 67  p 21 % improvement from saba p symb 160 prior to study with DLCO  108 % corrects to 113 % for alv volume  With variable truncation of insp portion of f/v loop and reported better benefit from 80 than 160 strength c/w uacs / vcd component  - 05/31/2018 flare on symb 80 2bid  - 07/18/2018 added singulair trial - 07/18/18 Spirometry   FEV1 2.6 (88%)  Ratio 0.80  - 09/01/2018  After extensive coaching inhaler device,  effectiveness =    75% (short Ti) rec try symb 80 just one bid to see if helps hoarseness or flares asthma > consider adding spacer next   In terms of the asthma component > he is using more saba than optional but much of this is for the upper airway component that paradoxically may be worse with higher rx ICS so try one bid and see if flares and if so increase symb to 160 2 bid with spacer

## 2018-09-04 ENCOUNTER — Encounter: Payer: Self-pay | Admitting: Internal Medicine

## 2018-09-06 DIAGNOSIS — L304 Erythema intertrigo: Secondary | ICD-10-CM | POA: Diagnosis not present

## 2018-09-06 DIAGNOSIS — L218 Other seborrheic dermatitis: Secondary | ICD-10-CM | POA: Diagnosis not present

## 2018-09-07 DIAGNOSIS — R109 Unspecified abdominal pain: Secondary | ICD-10-CM | POA: Diagnosis not present

## 2018-09-07 DIAGNOSIS — R350 Frequency of micturition: Secondary | ICD-10-CM | POA: Diagnosis not present

## 2018-09-07 DIAGNOSIS — R1031 Right lower quadrant pain: Secondary | ICD-10-CM | POA: Diagnosis not present

## 2018-09-07 DIAGNOSIS — N401 Enlarged prostate with lower urinary tract symptoms: Secondary | ICD-10-CM | POA: Diagnosis not present

## 2018-09-07 DIAGNOSIS — N138 Other obstructive and reflux uropathy: Secondary | ICD-10-CM | POA: Diagnosis not present

## 2018-09-20 ENCOUNTER — Other Ambulatory Visit: Payer: Self-pay | Admitting: Medical

## 2018-10-14 ENCOUNTER — Other Ambulatory Visit: Payer: Self-pay | Admitting: Internal Medicine

## 2018-10-19 ENCOUNTER — Other Ambulatory Visit: Payer: Self-pay | Admitting: Medical

## 2018-11-03 ENCOUNTER — Other Ambulatory Visit: Payer: Self-pay | Admitting: Internal Medicine

## 2018-11-15 ENCOUNTER — Other Ambulatory Visit: Payer: Self-pay | Admitting: Internal Medicine

## 2018-11-15 ENCOUNTER — Other Ambulatory Visit: Payer: Self-pay | Admitting: Medical

## 2018-11-26 ENCOUNTER — Other Ambulatory Visit: Payer: Self-pay | Admitting: Family Medicine

## 2018-11-26 DIAGNOSIS — I10 Essential (primary) hypertension: Secondary | ICD-10-CM

## 2018-11-27 NOTE — Telephone Encounter (Signed)
Copied from [place] [id number]. Topic: Quick Communication - Rx Refill/Question >> Nov 27, 2018  3:46 PM Loma Boston wrote: CRM for notification. See Telephone encounter for: 11/27/18.losartan (COZAAR) 100 MG tablet  [id number] - [place], [place] - [address] [phone] (Phone) [phone] (Fax)   Pt requesting refill

## 2018-11-29 ENCOUNTER — Other Ambulatory Visit: Payer: Self-pay | Admitting: Family Medicine

## 2018-11-29 MED ORDER — FLUTICASONE PROPIONATE 50 MCG/ACT NA SUSP: NASAL | 1 refills | Status: DC

## 2018-11-29 NOTE — Addendum Note (Signed)
Addended by: Wynonia Musty A on: 11/29/2018 01:52 PM   Modules accepted: Orders

## 2018-12-04 ENCOUNTER — Ambulatory Visit: Payer: Medicare Other | Admitting: Internal Medicine

## 2019-01-12 ENCOUNTER — Ambulatory Visit: Payer: Medicare Other | Admitting: Internal Medicine

## 2019-01-23 ENCOUNTER — Ambulatory Visit (INDEPENDENT_AMBULATORY_CARE_PROVIDER_SITE_OTHER): Payer: Medicare Other | Admitting: Internal Medicine

## 2019-01-23 ENCOUNTER — Other Ambulatory Visit: Payer: Self-pay

## 2019-01-23 ENCOUNTER — Encounter: Payer: Self-pay | Admitting: Internal Medicine

## 2019-01-23 DIAGNOSIS — J4530 Mild persistent asthma, uncomplicated: Secondary | ICD-10-CM

## 2019-01-23 MED ORDER — PANTOPRAZOLE SODIUM 40 MG PO TBEC: DELAYED_RELEASE_TABLET | ORAL | 3 refills | Status: DC

## 2019-01-23 MED ORDER — ALBUTEROL SULFATE HFA 108 (90 BASE) MCG/ACT IN AERS: 1.0000 | INHALATION_SPRAY | Freq: Four times a day (QID) | RESPIRATORY_TRACT | 2 refills | Status: DC | PRN

## 2019-01-23 NOTE — Progress Notes (Signed)
Subjective:    Patient ID: Joshua [name], male   DOB: January 28, 1951    MRN: [id number]    Brief patient profile:  68 yo Joshua [name] male  with bad bronchitis around 1993 and quit smoking and then  all better until around 2000 while living in [place] with intermittent "wheezing" dx as asthma > never resolved  Self referred for dtc asthma  Allergy testing in [place] around age 75 = neg per pt    History of Present Illness  06/29/2016 1st [facility] office visit/ Joshua [name]   Chief Complaint  Patient presents with  . Pulmonary Consult    moved from [place], chronic bronchitis, wheezing, dx with asthma in 2000, cold humidity makes it worse  maint on symb 160 2bid and at baseline intermittently noisy breathing one breath q 2-3 days  s pattern though not waking him  noct   Worse since  Jun 22 2016  with uri/ sinus symptoms >  [facility] rx  Amox/only a little better, still severe nasal congestion/severe fits of day > noct cough and subj wheeze.  Typical triggers are uri/not seasonal pattern rhintiis - happens up to 4 x yearly with or without symbicort and just as severe. rec Augmentin 875 mg take one pill twice daily  X 10 days   Please see patient coordinator before you leave today  to schedule CT sinus  in 10 days - no sooner Use nasal saline as much as possible to help moisturize your passages  Continue protonix but take it Take 30-60 min before first meal of the day  Prednisone 10 mg take  4 each am x 2 days,   2 each am x 2 days,  1 each am x 2 days and stop  Work on inhaler technique:  Plan A = Automatic = symbicort 80 Take 2 puffs first thing in am and then another 2 puffs about 12 hours later.  Plan B = Backup Only use your albuterol (Proir) as a rescue medication to be used if you can't catch your breath by resting or doing a relaxed purse lip breathing pattern.  - The less you use it, the better it will work when you need it. - Ok to use the inhaler up to 2 puffs  every 4 hours     07/21/2016  f/u  ov/Joshua [name] re: uacs vs asthma Chief Complaint  Patient presents with  . Follow-up    Cough is much improved, but has not completely resolved. Breathing has improved.   cough is worse at bedtime and in am p rising but not waking up prematurely and not productive  Bloody nasal d/c / no sob at all  rec Use lots of saline nasal spray on your nose Please remember to go to the x-ray department downstairs for your tests - we will call you with the results when they are available. Ty off the symbicort to see if any worse and if so restart  At 2 pffs every 12 hour schedule ENT  >   [name] rec abx and f/u prn      09/05/2017  f/u ov/Joshua [name] re: chronic asthma/? vcd component  Chief Complaint  Patient presents with  . Follow-up    Has noticed wheezing and chest tightness for the past few wks. He has been taking his symbicort in the am only. He uses his albuterol inhaler no more than once per wk.   Dyspnea:  Better when uses symbicort  Cough: none Sleep: rare chest tightness once a  month x 20 min  / better p saba  / No assoc overt hb SABA use: not using when needing symb  rec symbicort increase to 160 Take 2 puffs first thing in am and then another 2 puffs about 12 hours later Work on inhaler technique:     10/03/2017  f/u ov/Joshua [name] re: chronic asthma  Chief Complaint  Patient presents with  . Follow-up    SOB with exertion or long conversations, no wheezing has random gasps for air   Dyspnea:  Not limited by breathing from desired activities   Cough: no   Sleep: disturbed by back pain  SABA use:  Up to twice daily ? Consistent with symbicort -thinks the 82 works better than the 160  rec Change back to symbicort 80 Take 2 puffs first thing in am and then another 2 puffs about 12 hours later.  Only use your albuterol as a rescue medication   - late add consider singulair add next     07/18/2018 acute extended ov/Joshua [name] re:  Cough flare on maint rx with syb 80 and ppi q d at [place]  Patient presents with  . Acute Visit    cough with greenish mucus, SOB with exertion,    maint on symb80 2bid/ protonix 40 mg not ac and not needing overall doing well  but  never really cleared the afteroon dry cough  then much worse w/in a week of arriving back p  international travel  2 prior to OV  much worse cough assoc with nasal congestion/ slt bloody dc/ on flonase and sense of doe not better with saba but comfortable at rest sitting if not coughing Just started on augmentin  07/14/18 for uti by pcp no better yet  rec Add singulair 10 mg one each pm  When coughing at all > protonix Take 30- 60 min before your first and last meals of the day  Finish Augmentin 875 mg take one pill twice daily  GERD   Prednisone 10 mg take  4 each am x 2 days,   2 each am x 2 days,  1 each am x 2 days and stop  Take delsym two tsp every 12 hours and supplement if needed with  Tylenol #3 up to 1 every 4 hours to suppress the urge to cough. Swallowing water and/or using ice chips/non mint and menthol containing candies (such as lifesavers or sugarless jolly ranchers) are also effective.  You should rest your voice and avoid activities that you know make you cough. Once you have eliminated the cough for 3 straight days try reducing the tylenol #3 first,  then the delsym as tolerated.     09/01/2018  f/u ov/Joshua [name] re: asthma with uacs component on symb 80 2bid  Chief Complaint  Patient presents with  . Follow-up    Coughing less but still coughs if he talks alot. He is using his albuterol inhaler 3-4 x per wk.   Dyspnea:  MMRC1 = can walk nl pace, flat grade, can't hurry or go uphills or steps s sob   Cough: with voice use  Sleeping: no cough/ wheeze SABA use: as above / seems to helped rec Try symbicort 80 one twice daily to see if throat is better or breathing is worse If not better, call me for ent referral    . 01/23/2019  f/u ov/Joshua [name] re: asthma with ? uacs better, on symb 80 2 bid  Chief  Complaint  Patient presents  with  . Follow-up    Breathing is overall doing well. He has used his rescue inhaler once in the past wk.    Dyspnea:  MMRC1 = can walk nl pace, flat grade, can't hurry or go uphills or steps s sob   Cough: no  Sleeping: no respiratory symptoms  SABA use: rare 02: none    No obvious day to day or daytime variability or assoc excess/ purulent sputum or mucus plugs or hemoptysis or cp or chest tightness, subjective wheeze or overt sinus or hb symptoms.   Sleeping  without nocturnal  or early am exacerbation  of respiratory  c/o's or need for noct saba. Also denies any obvious fluctuation of symptoms with weather or environmental changes or other aggravating or alleviating factors except as outlined above   No unusual exposure hx or h/o childhood pna/ asthma or knowledge of premature birth.  Current Allergies, Complete Past Medical History, Past Surgical History, Family History, and Social History were reviewed in [employer] record.  ROS  The following are not active complaints unless bolded Hoarseness, sore throat, dysphagia, dental problems, itching, sneezing,  nasal congestion or discharge of excess mucus or purulent secretions, ear ache,   fever, chills, sweats, unintended wt loss or wt gain, classically pleuritic or exertional cp,  orthopnea pnd or arm/hand swelling  or leg swelling, presyncope, palpitations, abdominal pain, anorexia, nausea, vomiting, diarrhea  or change in bowel habits or change in bladder habits, change in stools or change in urine, dysuria, hematuria,  rash, arthralgias, visual complaints, headache, numbness, weakness or ataxia or problems with walking or coordination,  change in mood or  memory.        Current Meds  Medication Sig  . acyclovir ointment (ZOVIRAX) 5 % Apply 1 application topically every 3 (three) hours. Use for one week as needed for outbreak  . albuterol (PROVENTIL HFA;VENTOLIN HFA) 108 (90 Base)  MCG/ACT inhaler INHALE 1-2 PUFFS INTO THE LUNGS EVERY 6 (SIX) HOURS AS NEEDED FOR WHEEZING OR SHORTNESS OF BREATH.  Marland Kitchen alfuzosin (UROXATRAL) 10 MG 24 hr tablet Take 1 tablet (10 mg total) by mouth daily.  Marland Kitchen atorvastatin (LIPITOR) 10 MG tablet Take 1 tablet (10 mg total) by mouth daily.  . budesonide-formoterol (SYMBICORT) 80-4.5 MCG/ACT inhaler TAKE 2 PUFFS FIRST THING IN AM AND THEN ANOTHER 2 PUFFS ABOUT 12 HOURS LATER.  . fluticasone (FLONASE) 50 MCG/ACT nasal spray SPRAY 2 SPRAYS INTO EACH NOSTRIL EVERY DAY  . losartan (COZAAR) 100 MG tablet TAKE 1 TABLET BY MOUTH EVERY DAY  . montelukast (SINGULAIR) 5 MG chewable tablet TAKE 2 TABLETS BY MOUTH EVERY DAY **(10 MG TABLETS ON BACKORDER)  . pantoprazole (PROTONIX) 40 MG tablet Take 30- 60 min before your first and last meals of the day  . valACYclovir (VALTREX) 500 MG tablet Take 1 tablet (500 mg total) by mouth daily.             Objective:   Physical Exam  amb wm nad   01/23/2019       201  09/01/2018         205  07/18/2018       200     02/28/2018          203   10/03/2017         204   09/05/2017       204 07/21/16 196 lb (88.9 kg)  06/29/16 197 lb 12.8 oz (89.7 kg)    Vital signs reviewed -  Note on arrival 02 sats  95% on RA         HEENT: top dentures/ nl turbinates bilaterally, and oropharynx. Nl external ear canals without cough reflex   NECK :  without JVD/Nodes/TM/ nl carotid upstrokes bilaterally   LUNGS: no acc muscle use,  Nl contour chest which is clear to A and P bilaterally without cough on insp or exp maneuvers   CV:  RRR  no s3 or murmur or increase in P2, and no edema   ABD:  soft and nontender with nl inspiratory excursion in the supine position. No bruits or organomegaly appreciated, bowel sounds nl  MS:  Nl gait/ ext warm without deformities, calf tenderness, cyanosis or clubbing No obvious joint restrictions   SKIN: warm and dry without lesions    NEURO:  alert, approp, nl sensorium with  no motor or  cerebellar deficits apparent.         Assessment:

## 2019-01-23 NOTE — Patient Instructions (Signed)
Stop singulair but no other changes for now   Please schedule a follow up visit in 6  months but call sooner if needed

## 2019-01-24 ENCOUNTER — Encounter: Payer: Self-pay | Admitting: Internal Medicine

## 2019-01-24 NOTE — Assessment & Plan Note (Signed)
Onset around 2000 in [place] with allergy testing neg there 06/29/2016    reduce symbicort to 80 2 bid  - FENO 07/21/2016  =   24 on symb 80 2bid - Spirometry 07/21/2016  wnl including  Mid flows p am symbicort > try off > flared so restarted  - 09/05/2017    try symb increase to 160  - PFT's  10/03/2017  FEV1 2.57 (88 % ) ratio 67  p 21 % improvement from saba p symb 160 prior to study with DLCO  108 % corrects to 113 % for alv volume  With variable truncation of insp portion of f/v loop and reported better benefit from 80 than 160 strength c/w uacs / vcd component  - 05/31/2018 flare on symb 80 2bid  - 07/18/2018 added singulair trial > d/c 01/23/2019  - 07/18/18 Spirometry   FEV1 2.6 (88%)  Ratio 0.80  - 09/01/2018  After extensive coaching inhaler device,  effectiveness =    75% (short Ti) rec try symb 80 just one bid to see if helps hoarseness or flares asthma > consider adding spacer next   - try off singulair 01/23/2019   All goals of chronic asthma control met including optimal function and elimination of symptoms with minimal need for rescue therapy.  Contingencies discussed in full including contacting this office immediately if not controlling the symptoms using the rule of two's.     Not sure singulair helping so ok to try off = reverse therapeutic trial   F/u a 6 m  Each maintenance medication was reviewed in detail including most importantly the difference between maintenance and as needed and under what circumstances the prns are to be used.  Please see AVS for specific  Instructions which are unique to this visit and I personally typed out  which were reviewed in detail in writing with the patient and a copy provided.

## 2019-03-06 DIAGNOSIS — H40013 Open angle with borderline findings, low risk, bilateral: Secondary | ICD-10-CM | POA: Diagnosis not present

## 2019-03-06 DIAGNOSIS — H2513 Age-related nuclear cataract, bilateral: Secondary | ICD-10-CM | POA: Diagnosis not present

## 2019-03-06 DIAGNOSIS — H15102 Unspecified episcleritis, left eye: Secondary | ICD-10-CM | POA: Diagnosis not present

## 2019-03-06 DIAGNOSIS — Z83511 Family history of glaucoma: Secondary | ICD-10-CM | POA: Diagnosis not present

## 2019-03-18 ENCOUNTER — Ambulatory Visit (INDEPENDENT_AMBULATORY_CARE_PROVIDER_SITE_OTHER)
Admission: RE | Admit: 2019-03-18 | Discharge: 2019-03-18 | Disposition: A | Discharge status: Home / Self Care | Payer: Medicare Other | Source: Ambulatory Visit

## 2019-03-18 DIAGNOSIS — M545 Low back pain: Secondary | ICD-10-CM | POA: Diagnosis not present

## 2019-03-18 DIAGNOSIS — M5441 Lumbago with sciatica, right side: Secondary | ICD-10-CM

## 2019-03-18 MED ORDER — PREDNISONE 50 MG PO TABS: 50.0000 mg | ORAL_TABLET | Freq: Every day | ORAL | 0 refills | Status: DC

## 2019-03-18 MED ORDER — TIZANIDINE HCL 4 MG PO TABS: 4.0000 mg | ORAL_TABLET | Freq: Four times a day (QID) | ORAL | 0 refills | Status: DC | PRN

## 2019-03-18 NOTE — ED Provider Notes (Signed)
Virtual Visit via Video Note:  [name]  initiated request for Telemedicine visit with [facility] team. I connected with [name]  on 03/18/2019 at 3:42 PM  for a synchronized telemedicine visit using a video enabled HIPPA compliant telemedicine application. I verified that I am speaking with [name]  using two identifiers. [name] [name], PA-C  was physically located in a [facility] Urgent care site and [name] was located at a different location.   The limitations of evaluation and management by telemedicine as well as the availability of in-person appointments were discussed. Patient was informed that he  may incur a bill ( including co-pay) for this virtual visit encounter. [name]  expressed understanding and gave verbal consent to proceed with virtual visit.     History of Present Illness:Joshua [name]  is a 68 y.o. male presents with 1 day history of right lumbar region. Denies injury/trauma. Patient states, felt something to the back this morning and went to the pharmacy for medication. While getting out of the car, he felt sudden worsening of the back and now with significant pain. Pain radiates down the right leg, especially when trying to lift up his leg. Denies saddle anesthesia, loss of bladder or bowel control. Denies urinary frequency, dysuria, hematuria. Has tried topical medications, advil without relief.    Past Medical History:  Diagnosis Date  . Asthma   . BPH (benign prostatic hyperplasia)   . GERD (gastroesophageal reflux disease)   . HLD (hyperlipidemia)   . HSV infection   . Hypertension   . Kidney stones     No Known Allergies      Observations/Objective: (through phone as was unable to connect through video) General: Able to speak calmly, no obvious acute distress.  Pulm: Speaking in full sentences without difficulty. No respiratory distress. MSK: Patient is tender to right lumbar region. Denies numbness/tingling. No obvious midline  tenderness per patient. Positive straight leg raise per patient's description.  Neuro: Normal mental status. Alert and oriented x 3.   Assessment and Plan: Possible sciatica. Start prednisone. Muscle relaxant as needed. Ice/heat compresses. Discussed with patient strain can take up to 3-4 weeks to resolve, but should be getting better each week. Return precautions given.    Follow Up Instructions:    I discussed the assessment and treatment plan with the patient. The patient was provided an opportunity to ask questions and all were answered. The patient agreed with the plan and demonstrated an understanding of the instructions.   The patient was advised to call back or seek an in-person evaluation if the symptoms worsen or if the condition fails to improve as anticipated.  I provided 15 minutes of non-face-to-face time during this encounter.    Ok Edwards, PA-C  03/18/2019 3:42 PM         Ok Edwards, PA-C 03/18/19 1603

## 2019-03-18 NOTE — Discharge Instructions (Signed)
Start prednisone as directed. tizanidine as needed, this can make you drowsy, so do not take if you are going to drive, operate heavy machinery, or make important decisions. Ice/heat compresses as needed. This can take up to 3-4 weeks to completely resolve, but you should be feeling better each week. Follow up here or with PCP if symptoms worsen, changes for reevaluation. If experience numbness/tingling of the inner thighs, loss of bladder or bowel control, go to the emergency department for evaluation.

## 2019-03-20 ENCOUNTER — Other Ambulatory Visit: Payer: Self-pay | Admitting: Family Medicine

## 2019-03-20 DIAGNOSIS — A609 Anogenital herpesviral infection, unspecified: Secondary | ICD-10-CM

## 2019-03-24 ENCOUNTER — Other Ambulatory Visit: Payer: Self-pay | Admitting: Cardiology

## 2019-04-09 ENCOUNTER — Encounter: Payer: Self-pay | Admitting: Family Medicine

## 2019-04-09 ENCOUNTER — Other Ambulatory Visit: Payer: Self-pay

## 2019-04-09 ENCOUNTER — Ambulatory Visit (INDEPENDENT_AMBULATORY_CARE_PROVIDER_SITE_OTHER): Payer: Medicare Other | Admitting: Family Medicine

## 2019-04-09 DIAGNOSIS — R059 Cough, unspecified: Secondary | ICD-10-CM

## 2019-04-09 DIAGNOSIS — Z20828 Contact with and (suspected) exposure to other viral communicable diseases: Secondary | ICD-10-CM | POA: Diagnosis not present

## 2019-04-09 DIAGNOSIS — R05 Cough: Secondary | ICD-10-CM

## 2019-04-09 DIAGNOSIS — Z20822 Contact with and (suspected) exposure to covid-19: Secondary | ICD-10-CM

## 2019-04-09 NOTE — Progress Notes (Signed)
[place] [facility] [address] [place], [place] [address] [id number] [phone]  Date:  04/09/2019   Name:  Joshua [name]   DOB:  [birth year]   MRN:  [id number]  PCP:  Darreld Mclean, MD    Chief Complaint: No chief complaint on file.   History of Present Illness:  Joshua [name] is a 68 y.o. very pleasant male patient who presents with the following:   Virtual visit today for concern of illness Patient location is home, provider's office.  Patient identity confirmed with 2 factors, he gives consent for virtual visit today  Joshua [name] has history of dyslipidemia, hypertension, asthma and upper airway cough syndrome Last seen by myself about 1 year ago with lower back pain  Today he notes congestion in his chest and nose for 4-5 days. Some cough-feels like mucus is caught in his throat No fever noted He has not felt achy or fatigued He has noted a hoarse voice No distress or severe shortness of breath  No vomiting or diarrhea No ST or earache  He has not been wheezing except if he exhales hard He is using his symbicort daily albuerol on occasion-has used maybe twice since illness began   Patient Active Problem List   Diagnosis Date Noted  . Precordial chest pain 04/06/2018  . Dyspnea on exertion 04/06/2018  . Dyslipidemia 04/06/2018  . Essential hypertension 01/17/2018  . Solitary lung nodule 09/07/2017  . Sinusitis, chronic/Cough 07/22/2016  . Chronic asthma, mild persistent, uncomplicated [id number]  . Upper airway cough syndrome 06/29/2016    Past Medical History:  Diagnosis Date  . Asthma   . BPH (benign prostatic hyperplasia)   . GERD (gastroesophageal reflux disease)   . HLD (hyperlipidemia)   . HSV infection   . Hypertension   . Kidney stones     Past Surgical History:  Procedure Laterality Date  . APPENDECTOMY    . BREAST CYST EXCISION Left    benign  . LITHOTRIPSY    . TONSILLECTOMY      Social  History   Tobacco Use  . Smoking status: Former Smoker    Packs/day: 0.50    Years: 22.00    Pack years: 11.00    Types: Cigarettes    Quit date: 06/28/1993    Years since quitting: 25.7  . Smokeless tobacco: Never Used  Substance Use Topics  . Alcohol use: Yes    Comment: occasionally  . Drug use: No    Family History  Problem Relation Age of Onset  . Alzheimer's disease Father   . Kidney Stones Mother   . Colon cancer Paternal Grandfather 64    No Known Allergies  Medication list has been reviewed and updated.  Current Outpatient Medications on File Prior to Visit  Medication Sig Dispense Refill  . acyclovir ointment (ZOVIRAX) 5 % Apply 1 application topically every 3 (three) hours. Use for one week as needed for outbreak 30 g 3  . albuterol (VENTOLIN HFA) 108 (90 Base) MCG/ACT inhaler Inhale 1-2 puffs into the lungs every 6 (six) hours as needed for wheezing or shortness of breath. 8.5 g 2  . alfuzosin (UROXATRAL) 10 MG 24 hr tablet Take 1 tablet (10 mg total) by mouth daily. 90 tablet 3  . atorvastatin (LIPITOR) 10 MG tablet Take 1 tablet (10 mg total) by mouth daily. *NEEDS OFFICE VISIT FOR FURTHER REFILLS* 30 tablet 0  . budesonide-formoterol (SYMBICORT) 80-4.5 MCG/ACT inhaler TAKE 2 PUFFS  FIRST THING IN AM AND THEN ANOTHER 2 PUFFS ABOUT 12 HOURS LATER. 30.6 Inhaler 12  . fluticasone (FLONASE) 50 MCG/ACT nasal spray SPRAY 2 SPRAYS INTO EACH NOSTRIL EVERY DAY 48 g 1  . losartan (COZAAR) 100 MG tablet TAKE 1 TABLET BY MOUTH EVERY DAY 90 tablet 1  . pantoprazole (PROTONIX) 40 MG tablet Take 30- 60 min before your first and last meals of the day 90 tablet 3  . predniSONE (DELTASONE) 50 MG tablet Take 1 tablet (50 mg total) by mouth daily with breakfast. 5 tablet 0  . tiZANidine (ZANAFLEX) 4 MG tablet Take 1 tablet (4 mg total) by mouth every 6 (six) hours as needed for muscle spasms. 30 tablet 0  . valACYclovir (VALTREX) 500 MG tablet TAKE 1 TABLET BY MOUTH EVERY DAY 90 tablet  0   No current facility-administered medications on file prior to visit.     Review of Systems:  As per HPI- otherwise negative. No fever or chills No chest pain  Physical Examination: There were no vitals filed for this visit. There were no vitals filed for this visit. There is no height or weight on file to calculate BMI. Ideal Body Weight:    Pt observed via video  His BP is generally 112/80 or so at home  Assessment and Plan: Cough - Plan: SAR CoV2 Serology (COVID 19)AB(IGG)IA  Virtual visit today for mild cough.  Patient does not feel that bad, but does need to be screened for COVID-19.  Advised him to try Mucinex and/or Delsym over-the-counter for his symptoms.  Ordered Covid testing explained testing procedure.  He plans to be tested tomorrow Sent the following message to patient: It was good to see you today  Mucinex and Delsym for your symptoms can be helpful for your current symptoms Please be tested for COVID-19 at your earliest convenience  You can drive up to your local testing center- [facility]: [facility] [address], [address], [place], [place] (entrance off [employer] [address])  They are doing swabs between 8:30 and 3:30 Monday through Friday  If you are getting worse or have any other concerns, please let me know right away or otherwise seek help  Self isolate until your Covid results come back  Signed Lamar Blinks, MD

## 2019-04-11 LAB — NOVEL CORONAVIRUS, NAA: SARS-CoV-2, NAA: NOT DETECTED

## 2019-04-21 ENCOUNTER — Other Ambulatory Visit: Payer: Self-pay | Admitting: Cardiology

## 2019-04-24 NOTE — Telephone Encounter (Signed)
Refill sent to [facility] in [place], overdue for appt.

## 2019-04-26 ENCOUNTER — Other Ambulatory Visit: Payer: Self-pay | Admitting: Internal Medicine

## 2019-04-26 MED ORDER — ALBUTEROL SULFATE HFA 108 (90 BASE) MCG/ACT IN AERS: 1.0000 | INHALATION_SPRAY | Freq: Four times a day (QID) | RESPIRATORY_TRACT | 2 refills | Status: DC | PRN

## 2019-05-02 ENCOUNTER — Other Ambulatory Visit: Payer: Self-pay | Admitting: Cardiology

## 2019-05-11 ENCOUNTER — Other Ambulatory Visit: Payer: Self-pay

## 2019-05-11 ENCOUNTER — Ambulatory Visit (INDEPENDENT_AMBULATORY_CARE_PROVIDER_SITE_OTHER): Payer: Medicare Other

## 2019-05-11 DIAGNOSIS — Z23 Encounter for immunization: Secondary | ICD-10-CM

## 2019-05-11 NOTE — Addendum Note (Signed)
Addended by: Wynonia Musty A on: 05/11/2019 03:05 PM   Modules accepted: Orders

## 2019-05-15 DIAGNOSIS — H2513 Age-related nuclear cataract, bilateral: Secondary | ICD-10-CM | POA: Diagnosis not present

## 2019-05-15 DIAGNOSIS — H43393 Other vitreous opacities, bilateral: Secondary | ICD-10-CM | POA: Insufficient documentation

## 2019-05-15 DIAGNOSIS — H5203 Hypermetropia, bilateral: Secondary | ICD-10-CM | POA: Diagnosis not present

## 2019-05-15 DIAGNOSIS — H524 Presbyopia: Secondary | ICD-10-CM | POA: Diagnosis not present

## 2019-05-22 ENCOUNTER — Other Ambulatory Visit: Payer: Self-pay | Admitting: Cardiology

## 2019-05-31 ENCOUNTER — Other Ambulatory Visit: Payer: Self-pay | Admitting: Family Medicine

## 2019-05-31 DIAGNOSIS — I10 Essential (primary) hypertension: Secondary | ICD-10-CM

## 2019-05-31 MED ORDER — LOSARTAN POTASSIUM 100 MG PO TABS: 100.0000 mg | ORAL_TABLET | Freq: Every day | ORAL | 1 refills | Status: DC

## 2019-05-31 NOTE — Telephone Encounter (Signed)
Medication Refill - Medication: losartan (COZAAR) 100 MG tablet  Preferred Pharmacy:  [id number] - [place], [place] - [address] AT [address]  [address] [place] [address]  Phone: [phone] Fax: [phone]    Pt was advised that RX refills may take up to 3 business days. We ask that you follow-up with your pharmacy.

## 2019-06-01 ENCOUNTER — Ambulatory Visit (INDEPENDENT_AMBULATORY_CARE_PROVIDER_SITE_OTHER): Payer: Medicare Other | Admitting: Podiatry

## 2019-06-01 ENCOUNTER — Encounter: Payer: Self-pay | Admitting: Podiatry

## 2019-06-01 ENCOUNTER — Other Ambulatory Visit: Payer: Self-pay

## 2019-06-01 DIAGNOSIS — B351 Tinea unguium: Secondary | ICD-10-CM | POA: Diagnosis not present

## 2019-06-01 DIAGNOSIS — L600 Ingrowing nail: Secondary | ICD-10-CM

## 2019-06-01 NOTE — Patient Instructions (Signed)

## 2019-06-04 NOTE — Progress Notes (Signed)
Subjective:   Patient ID: Joshua [name], male   DOB: 68 y.o.   MRN: [id number]   HPI Patient presents stating the second nail has been bothering him for a couple months and is not sure what he may have done   ROS      Objective:  Physical Exam  Neurovascular status intact with a traumatized right second nail which is partially loosened off the bed and is local straight to the nailbed itself     Assessment:  Chronic nail disease right second nail that is partially detached     Plan:  H&P reviewed condition and recommended nail removal which was done with sterile technique.  Discussed nail regrowth and other modalities and that ultimately it may require permanent procedure

## 2019-06-25 ENCOUNTER — Other Ambulatory Visit: Payer: Self-pay | Admitting: Family Medicine

## 2019-06-25 DIAGNOSIS — A609 Anogenital herpesviral infection, unspecified: Secondary | ICD-10-CM

## 2019-07-24 ENCOUNTER — Other Ambulatory Visit: Payer: Self-pay | Admitting: Internal Medicine

## 2019-07-24 MED ORDER — ALBUTEROL SULFATE HFA 108 (90 BASE) MCG/ACT IN AERS: 1.0000 | INHALATION_SPRAY | Freq: Four times a day (QID) | RESPIRATORY_TRACT | 2 refills | Status: DC | PRN

## 2019-07-27 ENCOUNTER — Ambulatory Visit: Payer: Medicare Other | Admitting: Internal Medicine

## 2019-07-30 ENCOUNTER — Encounter: Payer: Self-pay | Admitting: Internal Medicine

## 2019-07-30 ENCOUNTER — Ambulatory Visit (INDEPENDENT_AMBULATORY_CARE_PROVIDER_SITE_OTHER): Payer: Medicare Other | Admitting: Internal Medicine

## 2019-07-30 ENCOUNTER — Other Ambulatory Visit: Payer: Self-pay

## 2019-07-30 DIAGNOSIS — J4530 Mild persistent asthma, uncomplicated: Secondary | ICD-10-CM

## 2019-07-30 MED ORDER — ALBUTEROL SULFATE HFA 108 (90 BASE) MCG/ACT IN AERS: 1.0000 | INHALATION_SPRAY | Freq: Four times a day (QID) | RESPIRATORY_TRACT | 5 refills | Status: DC | PRN

## 2019-07-30 MED ORDER — PANTOPRAZOLE SODIUM 40 MG PO TBEC: DELAYED_RELEASE_TABLET | ORAL | 1 refills | Status: DC

## 2019-07-30 MED ORDER — BUDESONIDE-FORMOTEROL FUMARATE 80-4.5 MCG/ACT IN AERO: INHALATION_SPRAY | RESPIRATORY_TRACT | 1 refills | Status: DC

## 2019-07-30 NOTE — Progress Notes (Signed)
Subjective:    Patient ID: Joshua [name], male   DOB: 1950/09/15    MRN: [id number]    Brief patient profile:  69 yo Joshua [name] male  with bad bronchitis around 1993 and quit smoking and then  all better until around 2000 while living in [place] with intermittent "wheezing" dx as asthma > never resolved  Self referred for dtc asthma  Allergy testing in [place] around age 58 = neg per pt    History of Present Illness  06/29/2016 1st [facility] office visit/ Joshua [name]   Chief Complaint  Patient presents with  . Pulmonary Consult    moved from [place], chronic bronchitis, wheezing, dx with asthma in 2000, cold humidity makes it worse  maint on symb 160 2bid and at baseline intermittently noisy breathing one breath q 2-3 days  s pattern though not waking him  noct   Worse since  Jun 22 2016  with uri/ sinus symptoms >  [facility] rx  Amox/only a little better, still severe nasal congestion/severe fits of day > noct cough and subj wheeze.  Typical triggers are uri/not seasonal pattern rhintiis - happens up to 4 x yearly with or without symbicort and just as severe. rec Augmentin 875 mg take one pill twice daily  X 10 days   Please see patient coordinator before you leave today  to schedule CT sinus  in 10 days - no sooner Use nasal saline as much as possible to help moisturize your passages  Continue protonix but take it Take 30-60 min before first meal of the day  Prednisone 10 mg take  4 each am x 2 days,   2 each am x 2 days,  1 each am x 2 days and stop  Work on inhaler technique:  Plan A = Automatic = symbicort 80 Take 2 puffs first thing in am and then another 2 puffs about 12 hours later.  Plan B = Backup Only use your albuterol (Proir) as a rescue medication to be used if you can't catch your breath by resting or doing a relaxed purse lip breathing pattern.  - The less you use it, the better it will work when you need it. - Ok to use the inhaler up to 2 puffs  every 4 hours     07/21/2016  f/u  ov/Joshua [name] re: uacs vs asthma Chief Complaint  Patient presents with  . Follow-up    Cough is much improved, but has not completely resolved. Breathing has improved.   cough is worse at bedtime and in am p rising but not waking up prematurely and not productive  Bloody nasal d/c / no sob at all  rec Use lots of saline nasal spray on your nose Please remember to go to the x-ray department downstairs for your tests - we will call you with the results when they are available. Ty off the symbicort to see if any worse and if so restart  At 2 pffs every 12 hour schedule ENT  >   [name] rec abx and f/u prn      09/05/2017  f/u ov/Joshua [name] re: chronic asthma/? vcd component  Chief Complaint  Patient presents with  . Follow-up    Has noticed wheezing and chest tightness for the past few wks. He has been taking his symbicort in the am only. He uses his albuterol inhaler no more than once per wk.   Dyspnea:  Better when uses symbicort  Cough: none Sleep: rare chest tightness once a  month x 20 min  / better p saba  / No assoc overt hb SABA use: not using when needing symb  rec symbicort increase to 160 Take 2 puffs first thing in am and then another 2 puffs about 12 hours later Work on inhaler technique:     10/03/2017  f/u ov/Joshua [name] re: chronic asthma  Chief Complaint  Patient presents with  . Follow-up    SOB with exertion or long conversations, no wheezing has random gasps for air   Dyspnea:  Not limited by breathing from desired activities   Cough: no   Sleep: disturbed by back pain  SABA use:  Up to twice daily ? Consistent with symbicort -thinks the 31 works better than the 160  rec Change back to symbicort 80 Take 2 puffs first thing in am and then another 2 puffs about 12 hours later.  Only use your albuterol as a rescue medication   - late add consider singulair add next     07/18/2018 acute extended ov/Joshua [name] re:  Cough flare on maint rx with syb 80 and ppi q d at [place]  Patient presents with  . Acute Visit    cough with greenish mucus, SOB with exertion,    maint on symb80 2bid/ protonix 40 mg not ac and not needing overall doing well  but  never really cleared the afteroon dry cough  then much worse w/in a week of arriving back p  international travel  2 prior to OV  much worse cough assoc with nasal congestion/ slt bloody dc/ on flonase and sense of doe not better with saba but comfortable at rest sitting if not coughing Just started on augmentin  07/14/18 for uti by pcp no better yet  rec Add singulair 10 mg one each pm  When coughing at all > protonix Take 30- 60 min before your first and last meals of the day  Finish Augmentin 875 mg take one pill twice daily  GERD   Prednisone 10 mg take  4 each am x 2 days,   2 each am x 2 days,  1 each am x 2 days and stop  Take delsym two tsp every 12 hours and supplement if needed with  Tylenol #3 up to 1 every 4 hours to suppress the urge to cough. Swallowing water and/or using ice chips/non mint and menthol containing candies (such as lifesavers or sugarless jolly ranchers) are also effective.  You should rest your voice and avoid activities that you know make you cough. Once you have eliminated the cough for 3 straight days try reducing the tylenol #3 first,  then the delsym as tolerated.     09/01/2018  f/u ov/Joshua [name] re: asthma with uacs component on symb 80 2bid  Chief Complaint  Patient presents with  . Follow-up    Coughing less but still coughs if he talks alot. He is using his albuterol inhaler 3-4 x per wk.   Dyspnea:  MMRC1 = can walk nl pace, flat grade, can't hurry or go uphills or steps s sob   Cough: with voice use  Sleeping: no cough/ wheeze SABA use: as above / seems to helped rec Try symbicort 80 one twice daily to see if throat is better or breathing is worse If not better, call me for ent referral     01/23/2019  f/u ov/Joshua [name] re: asthma with ? uacs better, on symb 80 2 bid  Chief  Complaint  Patient presents  with  . Follow-up    Breathing is overall doing well. He has used his rescue inhaler once in the past wk.   Dyspnea:  MMRC1 = can walk nl pace, flat grade, can't hurry or go uphills or steps s sob   Cough: no  Sleeping: no respiratory symptoms  SABA use: rare 02: none  rec Stop singulair but no other changes for now   07/30/2019  f/u ov/Dietrick Barris re: asthma with uacs component  Chief Complaint  Patient presents with  . Follow-up    Breathing is overall doing well today. He had noticed some wheezing over the past few days. He is using his albuterol about 2 x per wk.   Dyspnea:  MMRC1 = can walk nl pace, flat grade, can't hurry or go uphills or steps s sob   Cough: p stirring in am x light yellow clears throat  Sleeping: lies flat/ one pillow  SABA use: as above  02: none    No obvious day to day or daytime variability or assoc excess/ purulent sputum or mucus plugs or hemoptysis or cp or chest tightness,   or overt sinus or hb symptoms.   Sleeping  without nocturnal  or early am exacerbation  of respiratory  c/o's or need for noct saba. Also denies any obvious fluctuation of symptoms with weather or environmental changes or other aggravating or alleviating factors except as outlined above   No unusual exposure hx or h/o childhood pna/ asthma or knowledge of premature birth.  Current Allergies, Complete Past Medical History, Past Surgical History, Family History, and Social History were reviewed in [employer] record.  ROS  The following are not active complaints unless bolded Hoarseness, sore throat, dysphagia, dental problems, itching, sneezing,  nasal congestion or discharge of excess mucus or purulent secretions, ear ache,   fever, chills, sweats, unintended wt loss or wt gain, classically pleuritic or exertional cp,  orthopnea pnd or arm/hand swelling  or leg swelling, presyncope, palpitations, abdominal pain, anorexia, nausea,  vomiting, diarrhea  or change in bowel habits or change in bladder habits, change in stools or change in urine, dysuria, hematuria,  rash, arthralgias, visual complaints, headache, numbness, weakness or ataxia or problems with walking or coordination,  change in mood or  memory.        Current Meds  Medication Sig  . acyclovir ointment (ZOVIRAX) 5 % Apply 1 application topically every 3 (three) hours. Use for one week as needed for outbreak  . albuterol (VENTOLIN HFA) 108 (90 Base) MCG/ACT inhaler Inhale 1-2 puffs into the lungs every 6 (six) hours as needed for wheezing or shortness of breath.  . alfuzosin (UROXATRAL) 10 MG 24 hr tablet Take 1 tablet (10 mg total) by mouth daily.  Marland Kitchen atorvastatin (LIPITOR) 10 MG tablet TAKE 1 TABLET BY MOUTH DAILY (NEED OFFICE VISIT FOR REFILLS)  . budesonide-formoterol (SYMBICORT) 80-4.5 MCG/ACT inhaler TAKE 2 PUFFS FIRST THING IN AM AND THEN ANOTHER 2 PUFFS ABOUT 12 HOURS LATER.  Marland Kitchen losartan (COZAAR) 100 MG tablet Take 1 tablet (100 mg total) by mouth daily.  Marland Kitchen oxybutynin (DITROPAN-XL) 10 MG 24 hr tablet Take 10 mg by mouth daily.  . pantoprazole (PROTONIX) 40 MG tablet Take 30- 60 min before your first and last meals of the day  . tadalafil (CIALIS) 5 MG tablet Take 5 mg by mouth daily.  . valACYclovir (VALTREX) 500 MG tablet TAKE 1 TABLET BY MOUTH EVERY DAY  Objective:   Physical Exam  amb wm nad   07/30/2019         203  01/23/2019       201  09/01/2018         205  07/18/2018       200     02/28/2018          203   10/03/2017         204   09/05/2017       204 07/21/16 196 lb (88.9 kg)  06/29/16 197 lb 12.8 oz (89.7 kg)     Vital signs reviewed  07/30/2019  - Note at rest 02 sats  97% on RA       HEENT : pt wearing mask not removed for exam due to covid -19 concerns.    NECK :  without JVD/Nodes/TM/ nl carotid upstrokes bilaterally   LUNGS: no acc muscle use,  Nl contour chest which is clear to A and P bilaterally without cough on  insp or exp maneuvers   CV:  RRR  no s3 or murmur or increase in P2, and no edema   ABD:  soft and nontender with nl inspiratory excursion in the supine position. No bruits or organomegaly appreciated, bowel sounds nl  MS:  Nl gait/ ext warm without deformities, calf tenderness, cyanosis or clubbing No obvious joint restrictions   SKIN: warm and dry without lesions    NEURO:  alert, approp, nl sensorium with  no motor or cerebellar deficits apparent.         Assessment:

## 2019-07-30 NOTE — Patient Instructions (Addendum)
Work on inhaler technique:  relax and gently blow all the way out then take a nice smooth deep breath back in, triggering the inhaler at same time you start breathing in.  Hold for up to 5 seconds if you can. Blow symbicort  out thru nose. Rinse and gargle with water when done - arm and hammer toothpaste is good to use after symbicort to clear it out of your throat - make a slurry and gargle with it.    Please schedule a follow up visit in 6  months but call sooner if needed

## 2019-07-31 ENCOUNTER — Encounter: Payer: Self-pay | Admitting: Internal Medicine

## 2019-07-31 NOTE — Assessment & Plan Note (Addendum)
Onset around 2000 in [place] with allergy testing neg there 06/29/2016    reduce symbicort to 80 2 bid  - FENO 07/21/2016  =   24 on symb 80 2bid - Spirometry 07/21/2016  wnl including  Mid flows p am symbicort > try off > flared so restarted  - 09/05/2017    try symb increase to 160  - PFT's  10/03/2017  FEV1 2.57 (88 % ) ratio 67  p 21 % improvement from saba p symb 160 prior to study with DLCO  108 % corrects to 113 % for alv volume  With variable truncation of insp portion of f/v loop and reported better benefit from 80 than 160 strength c/w uacs / vcd component  - 05/31/2018 flare on symb 80 2bid  - 07/18/2018 added singulair trial > d/c 01/23/2019  - 07/18/18 Spirometry   FEV1 2.6 (88%)  Ratio 0.80  - 09/01/2018  After extensive coaching inhaler device,  effectiveness =    75% (short Ti) rec try symb 80 just one bid to see if helps hoarseness or flares asthma > consider adding spacer next  - try off singulair 01/23/2019  - 07/30/2019  After extensive coaching inhaler device,  effectiveness =    90%   Suspect "wheezing " is all upper airway so reluctant to push ICS dose higher and instead will have him work harder on hfa/ using baking soda toothpaste p rx   Pt informed of the seriousness of COVID 19 infection as a direct risk to lung health  and safey and to close contacts and should continue to wear a facemask in public and minimize exposure to public locations but especially avoid any area or activity where non-close contacts are not observing distancing or wearing an appropriate face mask.  I strongly recommended vaccine when offered.     >>> f/u q 6 m, sooner prn       Each maintenance medication was reviewed in detail including emphasizing most importantly the difference between maintenance and prns and under what circumstances the prns are to be triggered using an action plan format where appropriate.  Total time for H and P, chart review, counseling,  and generating customized AVS unique to this  office visit / charting = 20 min

## 2019-10-10 ENCOUNTER — Other Ambulatory Visit: Payer: Self-pay

## 2019-10-11 ENCOUNTER — Other Ambulatory Visit: Payer: Self-pay

## 2019-10-11 ENCOUNTER — Encounter: Payer: Self-pay | Admitting: Family Medicine

## 2019-10-11 ENCOUNTER — Ambulatory Visit (INDEPENDENT_AMBULATORY_CARE_PROVIDER_SITE_OTHER): Payer: Medicare Other | Admitting: Family Medicine

## 2019-10-11 VITALS — BP 134/86 | HR 74 | Temp 97.5°F | Resp 16 | Ht 66.0 in | Wt 199.0 lb

## 2019-10-11 DIAGNOSIS — G8929 Other chronic pain: Secondary | ICD-10-CM

## 2019-10-11 DIAGNOSIS — M545 Low back pain: Secondary | ICD-10-CM

## 2019-10-11 DIAGNOSIS — Z131 Encounter for screening for diabetes mellitus: Secondary | ICD-10-CM | POA: Diagnosis not present

## 2019-10-11 DIAGNOSIS — Z130 Encounter for screening for diseases of the blood and blood-forming organs and certain disorders involving the immune mechanism: Secondary | ICD-10-CM | POA: Diagnosis not present

## 2019-10-11 DIAGNOSIS — I10 Essential (primary) hypertension: Secondary | ICD-10-CM

## 2019-10-11 DIAGNOSIS — R7309 Other abnormal glucose: Secondary | ICD-10-CM

## 2019-10-11 DIAGNOSIS — E785 Hyperlipidemia, unspecified: Secondary | ICD-10-CM | POA: Diagnosis not present

## 2019-10-11 DIAGNOSIS — Z125 Encounter for screening for malignant neoplasm of prostate: Secondary | ICD-10-CM | POA: Diagnosis not present

## 2019-10-11 LAB — COMPREHENSIVE METABOLIC PANEL
ALT: 18 U/L (ref 0–53)
AST: 17 U/L (ref 0–37)
Albumin: 4 g/dL (ref 3.5–5.2)
Alkaline Phosphatase: 70 U/L (ref 39–117)
BUN: 17 mg/dL (ref 6–23)
CO2: 30 mEq/L (ref 19–32)
Calcium: 8.7 mg/dL (ref 8.4–10.5)
Chloride: 105 mEq/L (ref 96–112)
Creatinine, Ser: 1.08 mg/dL (ref 0.40–1.50)
GFR: 68.03 mL/min (ref >60.00)
Glucose, Bld: 92 mg/dL (ref 70–99)
Potassium: 4.1 mEq/L (ref 3.5–5.1)
Sodium: 139 mEq/L (ref 135–145)
Total Bilirubin: 0.5 mg/dL (ref 0.2–1.2)
Total Protein: 6.3 g/dL (ref 6.0–8.3)

## 2019-10-11 LAB — CBC
HCT: 43.1 % (ref 39.0–52.0)
Hemoglobin: 14.5 g/dL (ref 13.0–17.0)
MCHC: 33.6 g/dL (ref 30.0–36.0)
MCV: 81.7 fl (ref 78.0–100.0)
Platelets: 237 10*3/uL (ref 150.0–400.0)
RBC: 5.27 Mil/uL (ref 4.22–5.81)
RDW: 13.6 % (ref 11.5–15.5)
WBC: 6.7 10*3/uL (ref 4.0–10.5)

## 2019-10-11 LAB — LIPID PANEL
Cholesterol: 139 mg/dL (ref 0–200)
HDL: 50.1 mg/dL (ref >39.00)
LDL Cholesterol: 71 mg/dL (ref 0–99)
NonHDL: 89.38
Total CHOL/HDL Ratio: 3
Triglycerides: 92 mg/dL (ref 0.0–149.0)
VLDL: 18.4 mg/dL (ref 0.0–40.0)

## 2019-10-11 LAB — PSA, MEDICARE: PSA: 0.97 ng/ml (ref 0.10–4.00)

## 2019-10-11 LAB — HEMOGLOBIN A1C: Hgb A1c MFr Bld: 5.5 % (ref 4.6–6.5)

## 2019-10-11 MED ORDER — DICLOFENAC SODIUM 1 % EX GEL: 2.0000 g | Freq: Four times a day (QID) | CUTANEOUS | 3 refills | Status: DC

## 2019-10-11 MED ORDER — LOSARTAN POTASSIUM 100 MG PO TABS: 100.0000 mg | ORAL_TABLET | Freq: Every day | ORAL | 1 refills | Status: DC

## 2019-10-11 MED ORDER — METHOCARBAMOL 500 MG PO TABS: 500.0000 mg | ORAL_TABLET | Freq: Three times a day (TID) | ORAL | 1 refills | Status: DC | PRN

## 2019-10-11 MED ORDER — ATORVASTATIN CALCIUM 10 MG PO TABS: ORAL_TABLET | ORAL | 3 refills | Status: DC

## 2019-10-11 NOTE — Patient Instructions (Addendum)
It was great to see you again today, I will be in touch with your labs as soon as possible Please consider having the shingles vaccine given at your pharmacy if not done yet  We will try robaxin as needed for your back pain- this is a muscle relaxer, may cause you to feel drowsy Also can try diclofenac topical on your back as needed We will refer you for physical therapy  Assuming labs are ok, please see me in 6 months    Health Maintenance After Age 68 After age 13, you are at a higher risk for certain long-term diseases and infections as well as injuries from falls. Falls are a major cause of broken bones and head injuries in people who are older than age 38. Getting regular preventive care can help to keep you healthy and well. Preventive care includes getting regular testing and making lifestyle changes as recommended by your health care provider. Talk with your health care provider about:  Which screenings and tests you should have. A screening is a test that checks for a disease when you have no symptoms.  A diet and exercise plan that is right for you. What should I know about screenings and tests to prevent falls? Screening and testing are the best ways to find a health problem early. Early diagnosis and treatment give you the best chance of managing medical conditions that are common after age 33. Certain conditions and lifestyle choices may make you more likely to have a fall. Your health care provider may recommend:  Regular vision checks. Poor vision and conditions such as cataracts can make you more likely to have a fall. If you wear glasses, make sure to get your prescription updated if your vision changes.  Medicine review. Work with your health care provider to regularly review all of the medicines you are taking, including over-the-counter medicines. Ask your health care provider about any side effects that may make you more likely to have a fall. Tell your health care provider  if any medicines that you take make you feel dizzy or sleepy.  Osteoporosis screening. Osteoporosis is a condition that causes the bones to get weaker. This can make the bones weak and cause them to break more easily.  Blood pressure screening. Blood pressure changes and medicines to control blood pressure can make you feel dizzy.  Strength and balance checks. Your health care provider may recommend certain tests to check your strength and balance while standing, walking, or changing positions.  Foot health exam. Foot pain and numbness, as well as not wearing proper footwear, can make you more likely to have a fall.  Depression screening. You may be more likely to have a fall if you have a fear of falling, feel emotionally low, or feel unable to do activities that you used to do.  Alcohol use screening. Using too much alcohol can affect your balance and may make you more likely to have a fall. What actions can I take to lower my risk of falls? General instructions  Talk with your health care provider about your risks for falling. Tell your health care provider if: ? You fall. Be sure to tell your health care provider about all falls, even ones that seem minor. ? You feel dizzy, sleepy, or off-balance.  Take over-the-counter and prescription medicines only as told by your health care provider. These include any supplements.  Eat a healthy diet and maintain a healthy weight. A healthy diet includes low-fat dairy products,  low-fat (lean) meats, and fiber from whole grains, beans, and lots of fruits and vegetables. Home safety  Remove any tripping hazards, such as rugs, cords, and clutter.  Install safety equipment such as grab bars in bathrooms and safety rails on stairs.  Keep rooms and walkways well-lit. Activity   Follow a regular exercise program to stay fit. This will help you maintain your balance. Ask your health care provider what types of exercise are appropriate for you.  If  you need a cane or walker, use it as recommended by your health care provider.  Wear supportive shoes that have nonskid soles. Lifestyle  Do not drink alcohol if your health care provider tells you not to drink.  If you drink alcohol, limit how much you have: ? 0-1 drink a day for women. ? 0-2 drinks a day for men.  Be aware of how much alcohol is in your drink. In the [place] one drink equals one typical bottle of beer (12 oz), one-half glass of wine (5 oz), or one shot of hard liquor (1 oz).  Do not use any products that contain nicotine or tobacco, such as cigarettes and e-cigarettes. If you need help quitting, ask your health care provider. Summary  Having a healthy lifestyle and getting preventive care can help to protect your health and wellness after age 38.  Screening and testing are the best way to find a health problem early and help you avoid having a fall. Early diagnosis and treatment give you the best chance for managing medical conditions that are more common for people who are older than age 15.  Falls are a major cause of broken bones and head injuries in people who are older than age 42. Take precautions to prevent a fall at home.  Work with your health care provider to learn what changes you can make to improve your health and wellness and to prevent falls. This information is not intended to replace advice given to you by your health care provider. Make sure you discuss any questions you have with your health care provider. Document Revised: 10/05/2018 Document Reviewed: 04/27/2017 Elsevier Patient Education  2020 [employer].

## 2019-10-11 NOTE — Progress Notes (Addendum)
[place] at [facility] [address], [place], [place] [address] [phone] [id number] [phone]  Date:  10/11/2019   Name:  Joshua [name]   DOB:  01-30-1951   MRN:  [id number]  PCP:  Darreld Mclean, MD    Chief Complaint: Annual Exam   History of Present Illness:  Joshua [name] is a 69 y.o. very pleasant male patient who presents with the following:  Medicare patient here today for follow-up/physical exam Last seen by myself for a cough in October- virtual visit  History of hypertension, mild asthma, BPH, dyslipidemia, cataracts He is seeing urology- he would like a PSA level today for this visit  His urologist is DR Amalia Hailey in [place]   Albuterol as needed; he does not use this often Symbicort Lipitor Losartan  Colonoscopy up-to-date Shingrix- suggested that he get this done  Covid vaccine- 2nd dose 09/07/2019 Labs due today- he is not fasting today  He has noted right lower back pain since last summer; he was seen at [facility] in September- virtually  He was treated with a muscle relaxer and prednisone, got better temporarily but then his symptoms return He notes pain in the right sided lumbar muscles, does not radiate down the leg.  No numbness or weakness in his legs  He did have some lumbar films in 2018:  COMPARISON:  None. FINDINGS: Three views of the lumbar spine submitted. No acute fracture or subluxation. Mild lower lumbar levoscoliosis. Mild anterior spurring upper endplate of L4 and L5 vertebral body. Mild disc space flattening at L4-L5 and L5-S1 level. Facet degenerative changes are noted L4 and L5 level. Mild anterior spurring upper endplate of 624THL vertebral body. Minimal anterior spurring upper endplate of L2 vertebral body.  IMPRESSION: No acute fracture or subluxation. Mild degenerative changes as described above. Mild levoscoliosis.   He notes that NSAIDs can cause GI upset - he would like to try a topical option if  possible Would also like to do PT -this helped in the past   Patient Active Problem List   Diagnosis Date Noted  . Vitreous floater, bilateral 05/15/2019  . Nuclear sclerotic cataract of both eyes 05/15/2019  . Precordial chest pain 04/06/2018  . Dyspnea on exertion 04/06/2018  . Dyslipidemia 04/06/2018  . BPH with urinary obstruction 03/31/2018  . Essential hypertension 01/17/2018  . Solitary lung nodule 09/07/2017  . Sinusitis, chronic/Cough 07/22/2016  . Chronic asthma, mild persistent, uncomplicated [id number]  . Upper airway cough syndrome 06/29/2016    Past Medical History:  Diagnosis Date  . Asthma   . BPH (benign prostatic hyperplasia)   . GERD (gastroesophageal reflux disease)   . HLD (hyperlipidemia)   . HSV infection   . Hypertension   . Kidney stones     Past Surgical History:  Procedure Laterality Date  . APPENDECTOMY    . BREAST CYST EXCISION Left    benign  . LITHOTRIPSY    . TONSILLECTOMY      Social History   Tobacco Use  . Smoking status: Former Smoker    Packs/day: 0.50    Years: 22.00    Pack years: 11.00    Types: Cigarettes    Quit date: 06/28/1993    Years since quitting: 26.3  . Smokeless tobacco: Never Used  Substance Use Topics  . Alcohol use: Yes    Comment: occasionally  . Drug use: No    Family History  Problem Relation Age of Onset  . Alzheimer's  disease Father   . Kidney Stones Mother   . Colon cancer Paternal Grandfather 77    No Known Allergies  Medication list has been reviewed and updated.  Current Outpatient Medications on File Prior to Visit  Medication Sig Dispense Refill  . acyclovir ointment (ZOVIRAX) 5 % Apply 1 application topically every 3 (three) hours. Use for one week as needed for outbreak 30 g 3  . albuterol (VENTOLIN HFA) 108 (90 Base) MCG/ACT inhaler Inhale 1-2 puffs into the lungs every 6 (six) hours as needed for wheezing or shortness of breath. 8.5 g 5  . alfuzosin (UROXATRAL) 10 MG 24 hr tablet  Take 1 tablet (10 mg total) by mouth daily. 90 tablet 3  . budesonide-formoterol (SYMBICORT) 80-4.5 MCG/ACT inhaler TAKE 2 PUFFS FIRST THING IN AM AND THEN ANOTHER 2 PUFFS ABOUT 12 HOURS LATER. 30.6 Inhaler 1  . oxybutynin (DITROPAN-XL) 10 MG 24 hr tablet Take 10 mg by mouth daily.    . pantoprazole (PROTONIX) 40 MG tablet Take 30- 60 min before your first and last meals of the day 90 tablet 1  . tadalafil (CIALIS) 5 MG tablet Take 5 mg by mouth daily.    . valACYclovir (VALTREX) 500 MG tablet TAKE 1 TABLET BY MOUTH EVERY DAY 90 tablet 1   No current facility-administered medications on file prior to visit.    Review of Systems:  As per HPI- otherwise negative.   Physical Examination: Vitals:   10/11/19 1345  BP: 134/86  Pulse: 74  Resp: 16  Temp: (!) 97.5 F (36.4 C)  SpO2: 96%   Vitals:   10/11/19 1345  Weight: 199 lb (90.3 kg)  Height: 5\' 6"  (1.676 m)   Body mass index is 32.12 kg/m. Ideal Body Weight: Weight in (lb) to have BMI = 25: 154.6  GEN: no acute distress.  Overweight, looks well HEENT: Atraumatic, Normocephalic.   Wearing hearing aids bilaterally, PERRL Ears and Nose: No external deformity. CV: RRR, No M/G/R. No JVD. No thrill. No extra heart sounds. PULM: CTA B, no wheezes, crackles, rhonchi. No retractions. No resp. distress. No accessory muscle use. ABD: S, NT, ND, +BS. No rebound. No HSM. EXTR: No c/c/e PSYCH: Normally interactive. Conversant.  Normal bilateral lower extremity strength, sensation, DTR.  He has tenderness over the right sided paraspinous muscles in the lumbar levels.  Spasm is present.  Normal thoracolumbar range of motion   Assessment and Plan: Dyslipidemia - Plan: Lipid panel, atorvastatin (LIPITOR) 10 MG tablet  Screening for diabetes mellitus - Plan: Comprehensive metabolic panel, Hemoglobin A1c  Screening for deficiency anemia - Plan: CBC  Screening for prostate cancer - Plan: PSA, Medicare ( [place] [facility] only)  Elevated  glucose - Plan: Hemoglobin A1c  Essential hypertension - Plan: CBC, Comprehensive metabolic panel, losartan (COZAAR) 100 MG tablet  Chronic right-sided low back pain without sciatica - Plan: methocarbamol (ROBAXIN) 500 MG tablet, diclofenac Sodium (VOLTAREN) 1 % GEL, Ambulatory referral to Physical Therapy  Here today with concern of recurrent back pain.  Prescribed Robaxin, topical Voltaren, referral to PT Refilled routine medications Blood pressure under good control Await his labs as above, will be in touch with details Discussed health maintenance This visit occurred during the SARS-CoV-2 public health emergency.  Safety protocols were in place, including screening questions prior to the visit, additional usage of staff PPE, and extensive cleaning of exam room while observing appropriate contact time as indicated for disinfecting solutions.   Healthy diet, exercise, tobacco and alcohol avoidance encouraged  Signed Lamar Blinks, MD  Received his labs as below, message to patient  Results for orders placed or performed in visit on 10/11/19  CBC  Result Value Ref Range   WBC 6.7 4.0 - 10.5 K/uL   RBC 5.27 4.22 - 5.81 Mil/uL   Platelets 237.0 150.0 - 400.0 K/uL   Hemoglobin 14.5 13.0 - 17.0 g/dL   HCT 43.1 39.0 - 52.0 %   MCV 81.7 78.0 - 100.0 fl   MCHC 33.6 30.0 - 36.0 g/dL   RDW 13.6 11.5 - 15.5 %  Comprehensive metabolic panel  Result Value Ref Range   Sodium 139 135 - 145 mEq/L   Potassium 4.1 3.5 - 5.1 mEq/L   Chloride 105 96 - 112 mEq/L   CO2 30 19 - 32 mEq/L   Glucose, Bld 92 70 - 99 mg/dL   BUN 17 6 - 23 mg/dL   Creatinine, Ser 1.08 0.40 - 1.50 mg/dL   Total Bilirubin 0.5 0.2 - 1.2 mg/dL   Alkaline Phosphatase 70 39 - 117 U/L   AST 17 0 - 37 U/L   ALT 18 0 - 53 U/L   Total Protein 6.3 6.0 - 8.3 g/dL   Albumin 4.0 3.5 - 5.2 g/dL   GFR 68.03 >60.00 mL/min   Calcium 8.7 8.4 - 10.5 mg/dL  Hemoglobin A1c  Result Value Ref Range   Hgb A1c MFr Bld 5.5 4.6 - 6.5 %   Lipid panel  Result Value Ref Range   Cholesterol 139 0 - 200 mg/dL   Triglycerides 92.0 0.0 - 149.0 mg/dL   HDL 50.10 >39.00 mg/dL   VLDL 18.4 0.0 - 40.0 mg/dL   LDL Cholesterol 71 0 - 99 mg/dL   Total CHOL/HDL Ratio 3    NonHDL 89.38   PSA, Medicare ( [place] [facility] only)  Result Value Ref Range   PSA 0.97 0.10 - 4.00 ng/ml

## 2019-10-18 DIAGNOSIS — N138 Other obstructive and reflux uropathy: Secondary | ICD-10-CM | POA: Diagnosis not present

## 2019-10-18 DIAGNOSIS — Z87442 Personal history of urinary calculi: Secondary | ICD-10-CM | POA: Diagnosis not present

## 2019-10-18 DIAGNOSIS — N401 Enlarged prostate with lower urinary tract symptoms: Secondary | ICD-10-CM | POA: Diagnosis not present

## 2019-10-18 DIAGNOSIS — R350 Frequency of micturition: Secondary | ICD-10-CM | POA: Diagnosis not present

## 2019-10-29 DIAGNOSIS — M79604 Pain in right leg: Secondary | ICD-10-CM | POA: Diagnosis not present

## 2019-10-29 DIAGNOSIS — M6281 Muscle weakness (generalized): Secondary | ICD-10-CM | POA: Diagnosis not present

## 2019-10-29 DIAGNOSIS — M545 Low back pain: Secondary | ICD-10-CM | POA: Diagnosis not present

## 2019-10-31 DIAGNOSIS — M79604 Pain in right leg: Secondary | ICD-10-CM | POA: Diagnosis not present

## 2019-10-31 DIAGNOSIS — M6281 Muscle weakness (generalized): Secondary | ICD-10-CM | POA: Diagnosis not present

## 2019-10-31 DIAGNOSIS — M545 Low back pain: Secondary | ICD-10-CM | POA: Diagnosis not present

## 2019-11-05 DIAGNOSIS — M545 Low back pain: Secondary | ICD-10-CM | POA: Diagnosis not present

## 2019-11-05 DIAGNOSIS — M6281 Muscle weakness (generalized): Secondary | ICD-10-CM | POA: Diagnosis not present

## 2019-11-05 DIAGNOSIS — M79604 Pain in right leg: Secondary | ICD-10-CM | POA: Diagnosis not present

## 2019-11-07 DIAGNOSIS — M545 Low back pain: Secondary | ICD-10-CM | POA: Diagnosis not present

## 2019-11-07 DIAGNOSIS — M6281 Muscle weakness (generalized): Secondary | ICD-10-CM | POA: Diagnosis not present

## 2019-11-07 DIAGNOSIS — M79604 Pain in right leg: Secondary | ICD-10-CM | POA: Diagnosis not present

## 2019-11-12 DIAGNOSIS — M6281 Muscle weakness (generalized): Secondary | ICD-10-CM | POA: Diagnosis not present

## 2019-11-12 DIAGNOSIS — M545 Low back pain: Secondary | ICD-10-CM | POA: Diagnosis not present

## 2019-11-12 DIAGNOSIS — M79604 Pain in right leg: Secondary | ICD-10-CM | POA: Diagnosis not present

## 2019-11-14 DIAGNOSIS — M79604 Pain in right leg: Secondary | ICD-10-CM | POA: Diagnosis not present

## 2019-11-14 DIAGNOSIS — M6281 Muscle weakness (generalized): Secondary | ICD-10-CM | POA: Diagnosis not present

## 2019-11-14 DIAGNOSIS — M545 Low back pain: Secondary | ICD-10-CM | POA: Diagnosis not present

## 2019-11-19 DIAGNOSIS — M79604 Pain in right leg: Secondary | ICD-10-CM | POA: Diagnosis not present

## 2019-11-19 DIAGNOSIS — M545 Low back pain: Secondary | ICD-10-CM | POA: Diagnosis not present

## 2019-11-19 DIAGNOSIS — M6281 Muscle weakness (generalized): Secondary | ICD-10-CM | POA: Diagnosis not present

## 2019-11-28 ENCOUNTER — Telehealth: Payer: Self-pay | Admitting: Family Medicine

## 2019-11-28 DIAGNOSIS — M79604 Pain in right leg: Secondary | ICD-10-CM | POA: Diagnosis not present

## 2019-11-28 DIAGNOSIS — I10 Essential (primary) hypertension: Secondary | ICD-10-CM

## 2019-11-28 DIAGNOSIS — M545 Low back pain: Secondary | ICD-10-CM | POA: Diagnosis not present

## 2019-11-28 DIAGNOSIS — M6281 Muscle weakness (generalized): Secondary | ICD-10-CM | POA: Diagnosis not present

## 2019-11-28 MED ORDER — LOSARTAN POTASSIUM 100 MG PO TABS: 100.0000 mg | ORAL_TABLET | Freq: Every day | ORAL | 1 refills | Status: DC

## 2019-11-28 NOTE — Telephone Encounter (Signed)
Medication refilled

## 2019-11-28 NOTE — Telephone Encounter (Signed)
Medication: losartan (COZAAR) 100 MG tablet [id number]   Has the patient contacted their pharmacy? No. (If no, request that the patient contact the pharmacy for the refill.) (If yes, when and what did the pharmacy advise?)  Preferred Pharmacy (with phone number or street name): [id number] - [place], [place] - [address]  [address] [place], [place] [address]  Phone:  [phone] Fax:  [phone]  DEA #:  [id number]  Agent: Please be advised that RX refills may take up to 3 business days. We ask that you follow-up with your pharmacy.

## 2019-11-29 ENCOUNTER — Other Ambulatory Visit: Payer: Self-pay | Admitting: Internal Medicine

## 2019-11-29 MED ORDER — PANTOPRAZOLE SODIUM 40 MG PO TBEC: DELAYED_RELEASE_TABLET | ORAL | 1 refills | Status: DC

## 2019-12-03 DIAGNOSIS — M79604 Pain in right leg: Secondary | ICD-10-CM | POA: Diagnosis not present

## 2019-12-03 DIAGNOSIS — M545 Low back pain: Secondary | ICD-10-CM | POA: Diagnosis not present

## 2019-12-03 DIAGNOSIS — M6281 Muscle weakness (generalized): Secondary | ICD-10-CM | POA: Diagnosis not present

## 2019-12-05 DIAGNOSIS — M545 Low back pain: Secondary | ICD-10-CM | POA: Diagnosis not present

## 2019-12-05 DIAGNOSIS — M79604 Pain in right leg: Secondary | ICD-10-CM | POA: Diagnosis not present

## 2019-12-05 DIAGNOSIS — M6281 Muscle weakness (generalized): Secondary | ICD-10-CM | POA: Diagnosis not present

## 2020-01-23 ENCOUNTER — Other Ambulatory Visit: Payer: Self-pay | Admitting: Internal Medicine

## 2020-02-14 ENCOUNTER — Ambulatory Visit: Payer: Medicare Other | Admitting: Internal Medicine

## 2020-02-15 ENCOUNTER — Ambulatory Visit (INDEPENDENT_AMBULATORY_CARE_PROVIDER_SITE_OTHER): Payer: Medicare Other

## 2020-02-15 ENCOUNTER — Encounter: Payer: Self-pay | Admitting: Pulmonary Disease

## 2020-02-15 ENCOUNTER — Other Ambulatory Visit: Payer: Self-pay

## 2020-02-15 ENCOUNTER — Ambulatory Visit (INDEPENDENT_AMBULATORY_CARE_PROVIDER_SITE_OTHER): Payer: Medicare Other | Admitting: Pulmonary Disease

## 2020-02-15 VITALS — BP 122/80 | HR 92 | Temp 97.3°F | Ht 66.0 in | Wt 195.8 lb

## 2020-02-15 DIAGNOSIS — R0602 Shortness of breath: Secondary | ICD-10-CM | POA: Diagnosis not present

## 2020-02-15 DIAGNOSIS — J4530 Mild persistent asthma, uncomplicated: Secondary | ICD-10-CM

## 2020-02-15 MED ORDER — AZITHROMYCIN 250 MG PO TABS: ORAL_TABLET | ORAL | 0 refills | Status: DC

## 2020-02-15 MED ORDER — PREDNISONE 10 MG PO TABS: ORAL_TABLET | ORAL | 0 refills | Status: DC

## 2020-02-15 NOTE — Patient Instructions (Addendum)
You were seen today by Lauraine Rinne, NP  for:   1. Chronic asthma, mild persistent, uncomplicated  - DG Chest 2 View; Future - azithromycin (ZITHROMAX) 250 MG tablet; 500mg  (two tablets) today, then 250mg  (1 tablet) for the next 4 days  Dispense: 6 tablet; Refill: 0 - predniSONE (DELTASONE) 10 MG tablet; Take 2 tablets (20mg  total) daily for the next 5 days. Take in the AM with food.  Dispense: 10 tablet; Refill: 0  Prednisone 10mg  tablet  >>>Take 2 tablets (20 mg total) daily for the next 5 days >>> Take with food in the morning   Azithromycin 250mg  tablet  >>>Take 2 tablets (500mg  total) today, and then 1 tablet (250mg ) for the next four days  >>>take with food  >>>can also take probiotic and / or yogurt while on antibiotic   Continue Symbicort 80 >>> 2 puffs in the morning right when you wake up, rinse out your mouth after use, 12 hours later 2 puffs, rinse after use >>> Take this daily, no matter what >>> This is not a rescue inhaler    We recommend today:  Orders Placed This Encounter  Procedures  . DG Chest 2 View    Standing Status:   Future    Standing Expiration Date:   08/17/2020    Order Specific Question:   Reason for Exam (SYMPTOM  OR DIAGNOSIS REQUIRED)    Answer:   doe    Order Specific Question:   Preferred imaging location?    Answer:   Internal    Order Specific Question:   Radiology Contrast Protocol - do NOT remove file path    Answer:   [email or web address]   Orders Placed This Encounter  Procedures  . DG Chest 2 View   Meds ordered this encounter  Medications  . azithromycin (ZITHROMAX) 250 MG tablet    Sig: 500mg  (two tablets) today, then 250mg  (1 tablet) for the next 4 days    Dispense:  6 tablet    Refill:  0  . predniSONE (DELTASONE) 10 MG tablet    Sig: Take 2 tablets (20mg  total) daily for the next 5 days. Take in the AM with food.    Dispense:  10 tablet    Refill:  0    Follow Up:    Return in  about 4 months (around 06/16/2020), or if symptoms worsen or fail to improve, for Follow up with Dr. Melvyn Novas.   Please do your part to reduce the spread of COVID-19:      Reduce your risk of any infection  and COVID19 by using the similar precautions used for avoiding the common cold or flu:  Marland Kitchen Wash your hands often with soap and warm water for at least 20 seconds.  If soap and water are not readily available, use an alcohol-based hand sanitizer with at least 60% alcohol.  . If coughing or sneezing, cover your mouth and nose by coughing or sneezing into the elbow areas of your shirt or coat, into a tissue or into your sleeve (not your hands). Langley Gauss A MASK when in public  . Avoid shaking hands with others and consider head nods or verbal greetings only. . Avoid touching your eyes, nose, or mouth with unwashed hands.  . Avoid close contact with people who are sick. . Avoid places or events with large numbers of people in one location, like concerts or sporting events. . If you have some symptoms but not all symptoms, continue to monitor at  home and seek medical attention if your symptoms worsen. . If you are having a medical emergency, call 911.   [place] / e-Visit: [email or web address]         [facility]: [facility]: [phone]                   [facility]: [phone]     It is flu season:   >>> Best ways to protect herself from the flu: Receive the yearly flu vaccine, practice good hand hygiene washing with soap and also using hand sanitizer when available, eat a nutritious meals, get adequate rest, hydrate appropriately   Please contact the office if your symptoms worsen or you have concerns that you are not improving.   Thank you for choosing [facility] for your healthcare, and for allowing [place] to partner with  you on your healthcare journey. I am thankful to be able to provide care to you today.   Wyn Quaker FNP-C

## 2020-02-15 NOTE — Assessment & Plan Note (Signed)
Plan: Prednisone taper today Continue Symbicort 80 If patient has recurrent flares may need to consider increasing Symbicort to Symbicort 160 Chest x-ray today 3 to 27-month follow-up with Dr. Melvyn Novas

## 2020-02-15 NOTE — Progress Notes (Signed)
@Patient  ID: Joshua [name], male    DOB: May 30, 1951, 69 y.o.   MRN: [id number]  Chief Complaint  Patient presents with  . Follow-up    pt has sob.pt uses inhaler but sometimes does'nt clear sob he gets predisone taper    Referring provider: Copland, Gay Filler, MD  HPI:  69 year old male former smoker followed in our office for chronic asthma  PMH: Hypertension, lipidemia, BPH Smoker/ Smoking History: Smoker.  Quit 1995.  11-pack-year smoking history Maintenance: Symbicort 80 Pt of: Dr. Melvyn Novas  02/15/2020  - Visit   69 year old male former smoker followed in our office for chronic asthma. Followed by Dr. Melvyn Novas.  Patient presenting to office today reporting slight increase shortness of breath. Last office visit was in February/2021  Patient feels that his shortness of breath is worsened over the last 7 to 10 days.  He is having to use his rescue inhaler 2-3 times a day.  Remains adherent to Symbicort 80.  He is concerned that he may be having an asthma exacerbation.  He is having increased cough as well as wheezing.  Cough is productive with yellow to green mucus.  Patient feels that he is having trouble bringing up sputum.  Questionaires / Pulmonary Flowsheets:   ACT:  Asthma Control Test ACT Total Score  02/15/2020 13    MMRC: No flowsheet data found.  [facility]:  No flowsheet data found.  Tests:   FENO:  Lab Results  Component Value Date   NITRICOXIDE 24 07/21/2016    PFT: PFT Results Latest Ref Rng & Units 10/03/2017  FVC-Pre L 3.64  FVC-Predicted Pre % 92  FVC-Post L 3.82  FVC-Predicted Post % 97  Pre FEV1/FVC % % 58  Post FEV1/FCV % % 67  FEV1-Pre L 2.11  FEV1-Predicted Pre % 72  FEV1-Post L 2.57  DLCO uncorrected ml/min/mmHg 29.30  DLCO UNC% % 108  DLVA Predicted % 113    WALK:  No flowsheet data found.  Imaging: No results found.  Lab Results:  CBC    Component Value Date/Time   WBC 6.7 10/11/2019 1414   RBC 5.27 10/11/2019 1414   HGB 14.5  10/11/2019 1414   HCT 43.1 10/11/2019 1414   PLT 237.0 10/11/2019 1414   MCV 81.7 10/11/2019 1414   MCHC 33.6 10/11/2019 1414   RDW 13.6 10/11/2019 1414   LYMPHSABS 1.3 07/17/2018 1422   MONOABS 0.6 07/17/2018 1422   EOSABS 0.2 07/17/2018 1422   BASOSABS 0.0 07/17/2018 1422    BMET    Component Value Date/Time   NA 139 10/11/2019 1414   K 4.1 10/11/2019 1414   CL 105 10/11/2019 1414   CO2 30 10/11/2019 1414   GLUCOSE 92 10/11/2019 1414   BUN 17 10/11/2019 1414   CREATININE 1.08 10/11/2019 1414   CALCIUM 8.7 10/11/2019 1414    BNP No results found for: BNP  ProBNP No results found for: PROBNP  Specialty Problems      Pulmonary Problems   Upper airway cough syndrome    Onset 05/2016  - sinus CT p 10 day augmentin  06/29/2016 >>>  CT  Sinus 07/09/2016 mild chronic changes only  - cough better 07/21/2016 > try just on gerd rx/ no more symbicort > flared off symbicort  eval by [place] ent 07/26/16  [place], [place] > rec levaquin  500 x  10 days and f/u prn  - severe flare 07/18/2018 p uri/? Sinusitis > augmentin /cyclical cough rx> 0/12/3708 improved x for hoarseness and cough  with voice use       Chronic asthma, mild persistent, uncomplicated    Onset around 2000 in [place] with allergy testing neg there 06/29/2016    reduce symbicort to 80 2 bid  - FENO 07/21/2016  =   24 on symb 80 2bid - Spirometry 07/21/2016  wnl including  Mid flows p am symbicort > try off > flared so restarted  - 09/05/2017    try symb increase to 160  - PFT's  10/03/2017  FEV1 2.57 (88 % ) ratio 67  p 21 % improvement from saba p symb 160 prior to study with DLCO  108 % corrects to 113 % for alv volume  With variable truncation of insp portion of f/v loop and reported better benefit from 80 than 160 strength c/w uacs / vcd component  - 05/31/2018 flare on symb 80 2bid  - 07/18/2018 added singulair trial > d/c 01/23/2019  - 07/18/18 Spirometry   FEV1 2.6 (88%)  Ratio 0.80  - 09/01/2018  After extensive coaching inhaler  device,  effectiveness =    75% (short Ti) rec try symb 80 just one bid to see if helps hoarseness or flares asthma > consider adding spacer next  - try off singulair 01/23/2019  - 07/30/2019  After extensive coaching inhaler device,  effectiveness =    90%            Sinusitis, chronic/Cough    eval by [place] ent 07/26/16  [place], PA > rec levaquin  500 x  10 days and f/u prn       Solitary lung nodule    See cxr 09/05/2017 > f/u one month as this may be artifact and pt is >> 52 y out from smoking so low but not 0 risk > resolved 10/03/2017 / no dedicated f/u needed      Dyspnea on exertion      No Known Allergies  Immunization History  Administered Date(s) Administered  . Fluad Quad(high Dose 65+) 05/11/2019  . Influenza Split 04/01/2017  . Influenza, High Dose Seasonal PF 04/01/2017, 03/22/2018  . Influenza-Unspecified 05/21/2019  . Moderna SARS-COVID-2 Vaccination 08/04/2019, 09/05/2019  . Pneumococcal Conjugate-13 12/23/2017  . Pneumococcal Polysaccharide-23 05/11/2019  . Tdap 01/18/2018    Past Medical History:  Diagnosis Date  . Asthma   . BPH (benign prostatic hyperplasia)   . GERD (gastroesophageal reflux disease)   . HLD (hyperlipidemia)   . HSV infection   . Hypertension   . Kidney stones     Tobacco History: Social History   Tobacco Use  Smoking Status Former Smoker  . Packs/day: 0.50  . Years: 22.00  . Pack years: 11.00  . Types: Cigarettes  . Quit date: 06/28/1993  . Years since quitting: 26.6  Smokeless Tobacco Never Used   Counseling given: Yes   Continue to not smoke  Outpatient Encounter Medications as of 02/15/2020  Medication Sig  . acyclovir ointment (ZOVIRAX) 5 % Apply 1 application topically every 3 (three) hours. Use for one week as needed for outbreak  . albuterol (VENTOLIN HFA) 108 (90 Base) MCG/ACT inhaler Inhale 1-2 puffs into the lungs every 6 (six) hours as needed for wheezing or shortness of breath.  . alfuzosin (UROXATRAL) 10  MG 24 hr tablet Take 1 tablet (10 mg total) by mouth daily.  Marland Kitchen atorvastatin (LIPITOR) 10 MG tablet TAKE 1 TABLET BY MOUTH DAILY (NEED OFFICE VISIT FOR REFILLS)  . budesonide-formoterol (SYMBICORT) 80-4.5 MCG/ACT inhaler INHALE 2 PUFFS BY MOUTH  FIRST THING IN THE MORNING AND THEN 2 PUFFS ABOUT 12 HOURS LATER  . diclofenac Sodium (VOLTAREN) 1 % GEL Apply 2 g topically 4 (four) times daily. Use as needed for pain  . losartan (COZAAR) 100 MG tablet Take 1 tablet (100 mg total) by mouth daily.  . methocarbamol (ROBAXIN) 500 MG tablet Take 1 tablet (500 mg total) by mouth every 8 (eight) hours as needed for muscle spasms.  Marland Kitchen oxybutynin (DITROPAN-XL) 10 MG 24 hr tablet Take 10 mg by mouth daily.  . pantoprazole (PROTONIX) 40 MG tablet Take 30- 60 min before your first and last meals of the day  . tadalafil (CIALIS) 5 MG tablet Take 5 mg by mouth daily.  . valACYclovir (VALTREX) 500 MG tablet TAKE 1 TABLET BY MOUTH EVERY DAY  . azithromycin (ZITHROMAX) 250 MG tablet 500mg  (two tablets) today, then 250mg  (1 tablet) for the next 4 days  . predniSONE (DELTASONE) 10 MG tablet Take 2 tablets (20mg  total) daily for the next 5 days. Take in the AM with food.   No facility-administered encounter medications on file as of 02/15/2020.     Review of Systems  Review of Systems  Constitutional: Negative for activity change, chills, fatigue, fever and unexpected weight change.  HENT: Positive for congestion. Negative for postnasal drip, rhinorrhea, sinus pressure, sinus pain and sore throat.   Eyes: Negative.   Respiratory: Positive for cough, shortness of breath and wheezing.   Cardiovascular: Negative for chest pain and palpitations.  Gastrointestinal: Negative for constipation, diarrhea, nausea and vomiting.  Endocrine: Negative.   Genitourinary: Negative.   Musculoskeletal: Negative.   Skin: Negative.   Neurological: Negative for dizziness and headaches.  Psychiatric/Behavioral: Negative.  Negative for  dysphoric mood. The patient is not nervous/anxious.   All other systems reviewed and are negative.    Physical Exam  BP 122/80 (BP Location: Left Arm, Cuff Size: Normal)   Pulse 92   Temp (!) 97.3 F (36.3 C) (Oral)   Ht 5\' 6"  (1.676 m)   Wt 195 lb 12.8 oz (88.8 kg)   SpO2 98%   BMI 31.60 kg/m   Wt Readings from Last 5 Encounters:  02/15/20 195 lb 12.8 oz (88.8 kg)  10/11/19 199 lb (90.3 kg)  07/30/19 203 lb (92.1 kg)  01/23/19 201 lb 3.2 oz (91.3 kg)  09/01/18 205 lb (93 kg)    BMI Readings from Last 5 Encounters:  02/15/20 31.60 kg/m  10/11/19 32.12 kg/m  07/30/19 32.77 kg/m  01/23/19 32.47 kg/m  09/01/18 33.09 kg/m     Physical Exam Vitals and nursing note reviewed.  Constitutional:      General: He is not in acute distress.    Appearance: Normal appearance. He is normal weight.  HENT:     Head: Normocephalic and atraumatic.     Right Ear: Hearing and external ear normal.     Left Ear: Hearing and external ear normal.     Nose: Rhinorrhea present. No mucosal edema.     Right Turbinates: Not enlarged.     Left Turbinates: Not enlarged.     Mouth/Throat:     Mouth: Mucous membranes are dry.     Pharynx: Oropharynx is clear. No oropharyngeal exudate.  Eyes:     Pupils: Pupils are equal, round, and reactive to light.  Cardiovascular:     Rate and Rhythm: Normal rate and regular rhythm.     Pulses: Normal pulses.     Heart sounds: Normal heart sounds. No murmur  heard.   Pulmonary:     Effort: Pulmonary effort is normal.     Breath sounds: Wheezing present. No decreased breath sounds or rales.  Musculoskeletal:     Cervical back: Normal range of motion.     Right lower leg: No edema.     Left lower leg: No edema.  Lymphadenopathy:     Cervical: No cervical adenopathy.  Skin:    General: Skin is warm and dry.     Capillary Refill: Capillary refill takes less than 2 seconds.     Findings: No erythema or rash.  Neurological:     General: No focal  deficit present.     Mental Status: He is alert and oriented to person, place, and time.     Motor: No weakness.     Coordination: Coordination normal.     Gait: Gait is intact. Gait normal.  Psychiatric:        Mood and Affect: Mood normal.        Behavior: Behavior normal. Behavior is cooperative.        Thought Content: Thought content normal.        Judgment: Judgment normal.       Assessment & Plan:   Chronic asthma, mild persistent, uncomplicated Plan: Prednisone taper today Continue Symbicort 80 If patient has recurrent flares may need to consider increasing Symbicort to Symbicort 160 Chest x-ray today 3 to 54-month follow-up with Dr. Melvyn Novas   Return in about 4 months (around 06/16/2020), or if symptoms worsen or fail to improve, for Follow up with Dr. Melvyn Novas.   Lauraine Rinne, NP 02/15/2020   This appointment required 32 minutes of patient care (this includes precharting, chart review, review of results, face-to-face care, etc.).

## 2020-02-25 ENCOUNTER — Other Ambulatory Visit: Payer: Self-pay | Admitting: Internal Medicine

## 2020-05-12 ENCOUNTER — Telehealth: Payer: Self-pay | Admitting: Internal Medicine

## 2020-05-12 MED ORDER — BUDESONIDE-FORMOTEROL FUMARATE 160-4.5 MCG/ACT IN AERO: 2.0000 | INHALATION_SPRAY | Freq: Two times a day (BID) | RESPIRATORY_TRACT | 0 refills | Status: DC

## 2020-05-12 NOTE — Telephone Encounter (Signed)
Ok to try symbicort 160 2bid x one month then f/u before refills to decide what's best long term

## 2020-05-12 NOTE — Telephone Encounter (Signed)
Called pt back and there was no answer- LMTCB 

## 2020-05-12 NOTE — Telephone Encounter (Signed)
I spoke with the pt and notified of response per Dr Melvyn Novas  He verbalized understanding  Rx sent to pharm and appt with [name] for 06/09/20

## 2020-05-12 NOTE — Telephone Encounter (Signed)
Patient is returning phone call. Patient phone number is [phone].

## 2020-05-12 NOTE — Progress Notes (Signed)
[facility] [address] [place], [place] [address] [phone]  Date:  05/14/2020   Name:  Joshua [name]   DOB:  [birth year]   MRN:  [id number]  PCP:  Darreld Mclean, MD    Chief Complaint: Loss of Consciousness (saturday afternoon-oss of consciousness, fell out of chair,, shortness of breath)   History of Present Illness:  Joshua [name] is a 69 y.o. very pleasant male patient who presents with the following:  Patient today with concern of head injury- History of hypertension, mild asthma, BPH, dyslipidemia, cataracts Last seen by myself in April  Flu vaccine-give today COVID-19 vaccine booster-not done yet, encouraged him to have this vaccine Complete labs done in April, no concerns  Today is Wednesday.  Pt notes that this past Saturday he was eating dinner and started laughing very hard, seemed to choke on his food, "passed out" and fell off his chair. He is not sure if he actually had LOC or just fell- hard to say for sure. No urinary incontinence.  He hit his head on the floor.  He got up right away- his wife helped him and he seemed to to be basically back to normal right away  He currently feels back to normal His head is sore from where he hit it on the floor No further SOB, no chest pain  He does notice that sometimes if he tries to speak a long sentence he might get SOB- he will use albuterol in this case and it does help Laughing can also cause this effect   He is using symbicort as well as rescue inhaler as needed He has a pulmonologist-he contact them about this recent event, and they increased his dose of Symbicort Patient Active Problem List   Diagnosis Date Noted  . Vitreous floater, bilateral 05/15/2019  . Nuclear sclerotic cataract of both eyes 05/15/2019  . Precordial chest pain 04/06/2018  . Dyspnea on exertion 04/06/2018  . Dyslipidemia 04/06/2018  . BPH with urinary obstruction 03/31/2018   . Essential hypertension 01/17/2018  . Solitary lung nodule 09/07/2017  . Sinusitis, chronic/Cough 07/22/2016  . Chronic asthma, mild persistent, uncomplicated 09/32/3557  . Upper airway cough syndrome 06/29/2016    Past Medical History:  Diagnosis Date  . Asthma   . BPH (benign prostatic hyperplasia)   . GERD (gastroesophageal reflux disease)   . HLD (hyperlipidemia)   . HSV infection   . Hypertension   . Kidney stones     Past Surgical History:  Procedure Laterality Date  . APPENDECTOMY    . BREAST CYST EXCISION Left    benign  . LITHOTRIPSY    . TONSILLECTOMY      Social History   Tobacco Use  . Smoking status: Former Smoker    Packs/day: 0.50    Years: 22.00    Pack years: 11.00    Types: Cigarettes    Quit date: 06/28/1993    Years since quitting: 26.8  . Smokeless tobacco: Never Used  Vaping Use  . Vaping Use: Never used  Substance Use Topics  . Alcohol use: Yes    Comment: occasionally  . Drug use: No    Family History  Problem Relation Age of Onset  . Alzheimer's disease Father   . Kidney Stones Mother   . Colon cancer Paternal Grandfather 24    No Known Allergies  Medication list has been reviewed and updated.  Current Outpatient Medications on File  Prior to Visit  Medication Sig Dispense Refill  . acyclovir ointment (ZOVIRAX) 5 % Apply 1 application topically every 3 (three) hours. Use for one week as needed for outbreak 30 g 3  . albuterol (VENTOLIN HFA) 108 (90 Base) MCG/ACT inhaler Inhale 1-2 puffs into the lungs every 6 (six) hours as needed for wheezing or shortness of breath. 8.5 g 5  . alfuzosin (UROXATRAL) 10 MG 24 hr tablet Take 1 tablet (10 mg total) by mouth daily. 90 tablet 3  . atorvastatin (LIPITOR) 10 MG tablet TAKE 1 TABLET BY MOUTH DAILY (NEED OFFICE VISIT FOR REFILLS) 90 tablet 3  . budesonide-formoterol (SYMBICORT) 160-4.5 MCG/ACT inhaler Inhale 2 puffs into the lungs in the morning and at bedtime. 1 each 0  .  budesonide-formoterol (SYMBICORT) 80-4.5 MCG/ACT inhaler INHALE 2 PUFFS BY MOUTH FIRST THING IN THE MORNING AND THEN 2 PUFFS ABOUT 12 HOURS LATER 30.6 g 5  . diclofenac Sodium (VOLTAREN) 1 % GEL Apply 2 g topically 4 (four) times daily. Use as needed for pain 100 g 3  . losartan (COZAAR) 100 MG tablet Take 1 tablet (100 mg total) by mouth daily. 90 tablet 1  . methocarbamol (ROBAXIN) 500 MG tablet Take 1 tablet (500 mg total) by mouth every 8 (eight) hours as needed for muscle spasms. 30 tablet 1  . oxybutynin (DITROPAN-XL) 10 MG 24 hr tablet Take 10 mg by mouth daily.    . pantoprazole (PROTONIX) 40 MG tablet TAKE 1 TABLET BY MOUTH TWICE DAILY 30- 60 MINUTES BEFORE FIRST AND LAST MEAL OF THE DAY 90 tablet 1  . tadalafil (CIALIS) 5 MG tablet Take 5 mg by mouth daily.    . valACYclovir (VALTREX) 500 MG tablet TAKE 1 TABLET BY MOUTH EVERY DAY 90 tablet 1   No current facility-administered medications on file prior to visit.    Review of Systems:  As per HPI- otherwise negative.   Physical Examination: Vitals:   05/14/20 1312  BP: 126/82  Pulse: 91  Resp: 17  SpO2: 94%   Vitals:   05/14/20 1312  Weight: 197 lb (89.4 kg)  Height: 5\' 6"  (1.676 m)   Body mass index is 31.8 kg/m. Ideal Body Weight: Weight in (lb) to have BMI = 25: 154.6  GEN: no acute distress.  Mild overweight, looks well  HEENT: Atraumatic, Normocephalic.   Bilateral TM wnl, oropharynx normal.  PEERL,EOMI.   He notes tenderness at the left upper forehead and temple area from recent contusion.  No bruise or swelling, no step-off or crepitus Ears and Nose: No external deformity. CV: RRR, No M/G/R. No JVD. No thrill. No extra heart sounds. PULM: CTA B, no wheezes, crackles, rhonchi. No retractions. No resp. distress. No accessory muscle use. ABD: S, NT, ND, +BS. No rebound. No HSM. EXTR: No c/c/e PSYCH: Normally interactive. Conversant.   EKG: SR, compared with tracing from 2019-no significant change  noted Assessment and Plan: Choking due to food in larynx, initial encounter - Plan: DG Chest 2 View, EKG 12-Lead  LOC (loss of consciousness) ([facility]) - Plan: EKG 12-Lead  Traumatic injury of head, initial encounter - Plan: CT Head Wo Contrast  Needs flu shot - Plan: Flu Vaccine QUAD High Dose(Fluad)  Patient today following an episode where he either choked or had laryngeal spasm/bronchospasm due to asthma.  At any rate, he was temporarily unable to breathe and fell from his chair.  Uncertain if he lost consciousness.  He struck his head on the floor, was immediately  back to normal. Suspect this episode was caused by a momentary lack of air.  EKG is normal.  Will obtain a chest film to look for any evidence of aspiration, patient would like to do a CT head to rule out skull fracture which is reasonable.  Assuming these are normal, we will have him closely watch for any worsening of his symptoms.  His pulmonologist has already adjusted his inhalers in hopes of avoiding any future episodes  This visit occurred during the SARS-CoV-2 public health emergency.  Safety protocols were in place, including screening questions prior to the visit, additional usage of staff PPE, and extensive cleaning of exam room while observing appropriate contact time as indicated for disinfecting solutions.     Signed Lamar Blinks, MD  DG Chest 2 View  Result Date: 05/14/2020 CLINICAL DATA:  Choking, concern for aspiration EXAM: CHEST - 2 VIEW COMPARISON:  Chest 02/15/2020 FINDINGS: The heart size and mediastinal contours are within normal limits. Slightly low lung volumes. Minimal linear bibasilar opacities appear unchanged from prior and favor atelectasis. No new focal airspace consolidation. No pleural effusion or pneumothorax. The visualized skeletal structures are unremarkable. IMPRESSION: Low lung volumes with minimal linear bibasilar opacities, likely atelectasis. No new focal airspace consolidation.  Electronically Signed   By: Davina Poke D.O.   On: 05/14/2020 14:39

## 2020-05-12 NOTE — Telephone Encounter (Signed)
Spoke with the pt  He had episode 3 days ago where he was laughing and then became very SOB and lost consciousness for "2 seconds" He states aside from this episode he has felt okay, just gets SOB when he talks or laughs, but nothing new about this  He denies any increased cough, wheezing, f/c/s, body aches  He has been vaccinated against covid  He states last ov [name] mentioned possibly increasing his symbicort from 80 to 160 dose  He is wondering if he needs to do this now or if needs ov  He has been taking his symbicort 80 2 puffs bid and uses the albuterol 3 x per wk on average  Please advise, thanks!    Return in about 4 months (around 06/16/2020), or if symptoms worsen or fail to improve, for Follow up with Dr. Melvyn Novas. You were seen today by Lauraine Rinne, NP  for:   1. Chronic asthma, mild persistent, uncomplicated  - DG Chest 2 View; Future - azithromycin (ZITHROMAX) 250 MG tablet; 500mg  (two tablets) today, then 250mg  (1 tablet) for the next 4 days  Dispense: 6 tablet; Refill: 0 - predniSONE (DELTASONE) 10 MG tablet; Take 2 tablets (20mg  total) daily for the next 5 days. Take in the AM with food.  Dispense: 10 tablet; Refill: 0  Prednisone 10mg  tablet  >>>Take 2 tablets (20 mg total) daily for the next 5 days >>> Take with food in the morning   Azithromycin 250mg  tablet  >>>Take 2 tablets (500mg  total) today, and then 1 tablet (250mg ) for the next four days  >>>take with food  >>>can also take probiotic and / or yogurt while on antibiotic   Continue Symbicort 80 >>> 2 puffs in the morning right when you wake up, rinse out your mouth after use, 12 hours later 2 puffs, rinse after use >>> Take this daily, no matter what >>> This is not a rescue inhaler

## 2020-05-14 ENCOUNTER — Ambulatory Visit (HOSPITAL_BASED_OUTPATIENT_CLINIC_OR_DEPARTMENT_OTHER)
Admission: RE | Admit: 2020-05-14 | Discharge: 2020-05-14 | Disposition: A | Discharge status: Home / Self Care | Payer: Medicare Other | Source: Ambulatory Visit | Attending: Family Medicine | Admitting: Family Medicine

## 2020-05-14 ENCOUNTER — Other Ambulatory Visit: Payer: Self-pay

## 2020-05-14 ENCOUNTER — Ambulatory Visit (INDEPENDENT_AMBULATORY_CARE_PROVIDER_SITE_OTHER): Payer: Medicare Other | Admitting: Family Medicine

## 2020-05-14 ENCOUNTER — Encounter: Payer: Self-pay | Admitting: Family Medicine

## 2020-05-14 VITALS — BP 126/82 | HR 91 | Resp 17 | Ht 66.0 in | Wt 197.0 lb

## 2020-05-14 DIAGNOSIS — J9811 Atelectasis: Secondary | ICD-10-CM | POA: Diagnosis not present

## 2020-05-14 DIAGNOSIS — Z23 Encounter for immunization: Secondary | ICD-10-CM

## 2020-05-14 DIAGNOSIS — T17320A Food in larynx causing asphyxiation, initial encounter: Secondary | ICD-10-CM | POA: Diagnosis not present

## 2020-05-14 DIAGNOSIS — S0990XA Unspecified injury of head, initial encounter: Secondary | ICD-10-CM

## 2020-05-14 DIAGNOSIS — R4020 Unspecified coma: Secondary | ICD-10-CM

## 2020-05-14 DIAGNOSIS — R4182 Altered mental status, unspecified: Secondary | ICD-10-CM | POA: Diagnosis not present

## 2020-05-14 NOTE — Patient Instructions (Addendum)
It was good to see you today- I am sorry that you choked/ passed out!  I am hoping that the higher dose of symbicort will help prevent this happening again  For now we will try and make sure you did not aspirate or hurt your head.  Chest film and head CT today-  I will be in touch with these results asap   You got your flu shot today also

## 2020-05-30 ENCOUNTER — Other Ambulatory Visit: Payer: Self-pay | Admitting: Internal Medicine

## 2020-06-09 ENCOUNTER — Encounter: Payer: Self-pay | Admitting: Pulmonary Disease

## 2020-06-09 ENCOUNTER — Ambulatory Visit (INDEPENDENT_AMBULATORY_CARE_PROVIDER_SITE_OTHER): Payer: Medicare Other | Admitting: Pulmonary Disease

## 2020-06-09 ENCOUNTER — Other Ambulatory Visit: Payer: Self-pay

## 2020-06-09 VITALS — BP 118/70 | HR 92 | Temp 98.3°F | Ht 66.0 in | Wt 205.2 lb

## 2020-06-09 DIAGNOSIS — Z0000 Encounter for general adult medical examination without abnormal findings: Secondary | ICD-10-CM | POA: Insufficient documentation

## 2020-06-09 DIAGNOSIS — J4530 Mild persistent asthma, uncomplicated: Secondary | ICD-10-CM

## 2020-06-09 DIAGNOSIS — J310 Chronic rhinitis: Secondary | ICD-10-CM | POA: Diagnosis not present

## 2020-06-09 NOTE — Progress Notes (Signed)
@Patient  ID: Joshua [name], male    DOB: [birth year], 69 y.o.   MRN: [id number]  Chief Complaint  Patient presents with  . Follow-up  . Asthma    Referring provider: Copland, Gay Filler, MD  HPI:  69 year old male former smoker followed in our office for chronic asthma  PMH: Hypertension, lipidemia, BPH Smoker/ Smoking History: Smoker.  Quit 1995.  11-pack-year smoking history Maintenance: Symbicort 160 Pt of: Dr. Melvyn Novas  06/09/2020  - Visit   69 year old male former smoker followed in our office for chronic asthma.  Established with Dr. Melvyn Novas.  Patient presenting to office today as a follow-up after having his Symbicort increased to Symbicort 160 telephonically.  He was last evaluated in our office in August/2021.  Plan of care at that point in time was for the patient to remain on Symbicort 80 twice daily, he was treated with a prednisone taper and had a chest x-ray completed it was instructed for him to follow-up in 4 months in December/2021.  Patient contacted our office 05/12/2020 reporting that he had an episode on 05/09/2020 where he was laughing he became short of breath and he lost consciousness for "2 seconds".  He was wondering if he could try Symbicort 160 given the fact that he was taking Symbicort 80 as instructed and still using his albuterol rescue inhaler 3 times per week.  Dr. Melvyn Novas instructed him to start Symbicort 160 but have follow-up with our office.  That is what prompted the appointment today.  Patient reports that overall has been doing well since last being seen.  He is maintained on Symbicort 160 at this point in time.  He is not using a daily antihistamine.  He occasionally has to use Flonase or nasal saline rinses in the spring and summer months for rhinitis symptoms.  He reports that over the last year he seems to have worsened chest congestion.  This is predominantly in the morning.  He has received the seasonal flu vaccine for this year.  He is received the first  2 COVID-19 vaccinations.  He is holding off on receiving the COVID-19 booster vaccination until spring 2022.   Questionaires / Pulmonary Flowsheets:   ACT:  Asthma Control Test ACT Total Score  02/15/2020 13    MMRC: No flowsheet data found.  Epworth:  No flowsheet data found.  Tests:   FENO:  Lab Results  Component Value Date   NITRICOXIDE 24 07/21/2016    PFT: PFT Results Latest Ref Rng & Units 10/03/2017  FVC-Pre L 3.64  FVC-Predicted Pre % 92  FVC-Post L 3.82  FVC-Predicted Post % 97  Pre FEV1/FVC % % 58  Post FEV1/FCV % % 67  FEV1-Pre L 2.11  FEV1-Predicted Pre % 72  FEV1-Post L 2.57  DLCO uncorrected ml/min/mmHg 29.30  DLCO UNC% % 108  DLVA Predicted % 113    WALK:  No flowsheet data found.  Imaging: DG Chest 2 View  Result Date: 05/14/2020 CLINICAL DATA:  Choking, concern for aspiration EXAM: CHEST - 2 VIEW COMPARISON:  Chest 02/15/2020 FINDINGS: The heart size and mediastinal contours are within normal limits. Slightly low lung volumes. Minimal linear bibasilar opacities appear unchanged from prior and favor atelectasis. No new focal airspace consolidation. No pleural effusion or pneumothorax. The visualized skeletal structures are unremarkable. IMPRESSION: Low lung volumes with minimal linear bibasilar opacities, likely atelectasis. No new focal airspace consolidation. Electronically Signed   By: Davina Poke D.O.   On: 05/14/2020 14:39   CT Head Wo  Contrast  Result Date: 05/14/2020 CLINICAL DATA:  Altered mental status with syncope EXAM: CT HEAD WITHOUT CONTRAST TECHNIQUE: Contiguous axial images were obtained from the base of the skull through the vertex without intravenous contrast. COMPARISON:  None. FINDINGS: Brain: Mild age related volume loss present. No intracranial mass, hemorrhage, extra-axial fluid collection, or midline shift. The brain parenchyma appears unremarkable. No evident acute infarct. Vascular: No hyperdense vessel. There is minimal  calcification in the right carotid siphon. Skull: The bony calvarium appears intact. Sinuses/Orbits: There is mild mucosal thickening in several ethmoid air cells. Other visualized paranasal sinuses are clear. Orbits appear symmetric bilaterally. Other: Visualized mastoid air cells are clear. IMPRESSION: Normal appearing brain parenchyma. No acute infarct. No mass or hemorrhage. Minimal arterial vascular calcification evident. There is mild mucosal thickening in several ethmoid air cells. Electronically Signed   By: Lowella Grip III M.D.   On: 05/14/2020 14:50    Lab Results:  CBC    Component Value Date/Time   WBC 6.7 10/11/2019 1414   RBC 5.27 10/11/2019 1414   HGB 14.5 10/11/2019 1414   HCT 43.1 10/11/2019 1414   PLT 237.0 10/11/2019 1414   MCV 81.7 10/11/2019 1414   MCHC 33.6 10/11/2019 1414   RDW 13.6 10/11/2019 1414   LYMPHSABS 1.3 07/17/2018 1422   MONOABS 0.6 07/17/2018 1422   EOSABS 0.2 07/17/2018 1422   BASOSABS 0.0 07/17/2018 1422    BMET    Component Value Date/Time   NA 139 10/11/2019 1414   K 4.1 10/11/2019 1414   CL 105 10/11/2019 1414   CO2 30 10/11/2019 1414   GLUCOSE 92 10/11/2019 1414   BUN 17 10/11/2019 1414   CREATININE 1.08 10/11/2019 1414   CALCIUM 8.7 10/11/2019 1414    BNP No results found for: BNP  ProBNP No results found for: PROBNP  Specialty Problems      Pulmonary Problems   Upper airway cough syndrome    Onset 05/2016  - sinus CT p 10 day augmentin  06/29/2016 >>>  CT  Sinus 07/09/2016 mild chronic changes only  - cough better 07/21/2016 > try just on gerd rx/ no more symbicort > flared off symbicort  eval by [place] ent 07/26/16  [place], [place] > rec levaquin  500 x  10 days and f/u prn  - severe flare 07/18/2018 p uri/? Sinusitis > augmentin /cyclical cough rx> 01/30/1323 improved x for hoarseness and cough with voice use       Chronic asthma, mild persistent, uncomplicated    Onset around 2000 in [place] with allergy testing neg  there 06/29/2016    reduce symbicort to 80 2 bid  - FENO 07/21/2016  =   24 on symb 80 2bid - Spirometry 07/21/2016  wnl including  [place] flows p am symbicort > try off > flared so restarted  - 09/05/2017    try symb increase to 160  - PFT's  10/03/2017  FEV1 2.57 (88 % ) ratio 67  p 21 % improvement from saba p symb 160 prior to study with DLCO  108 % corrects to 113 % for alv volume  With variable truncation of insp portion of f/v loop and reported better benefit from 80 than 160 strength c/w uacs / vcd component  - 05/31/2018 flare on symb 80 2bid  - 07/18/2018 added singulair trial > d/c 01/23/2019  - 07/18/18 Spirometry   FEV1 2.6 (88%)  Ratio 0.80  - 09/01/2018  After extensive coaching inhaler device,  effectiveness =  75% (short Ti) rec try symb 80 just one bid to see if helps hoarseness or flares asthma > consider adding spacer next  - try off singulair 01/23/2019  - 07/30/2019  After extensive coaching inhaler device,  effectiveness =    90%            Sinusitis, chronic/Cough    eval by [place] ent 07/26/16  [place], PA > rec levaquin  500 x  10 days and f/u prn       Solitary lung nodule    See cxr 09/05/2017 > f/u one month as this may be artifact and pt is >> 86 y out from smoking so low but not 0 risk > resolved 10/03/2017 / no dedicated f/u needed      Dyspnea on exertion   Rhinitis      No Known Allergies  Immunization History  Administered Date(s) Administered  . Fluad Quad(high Dose 65+) 05/11/2019, 05/14/2020  . Influenza Split 04/01/2017  . Influenza, High Dose Seasonal PF 04/01/2017, 03/22/2018  . Influenza-Unspecified 05/21/2019  . Moderna Sars-Covid-2 Vaccination 08/04/2019, 09/05/2019  . Pneumococcal Conjugate-13 12/23/2017  . Pneumococcal Polysaccharide-23 05/11/2019  . Tdap 01/18/2018    Past Medical History:  Diagnosis Date  . Asthma   . BPH (benign prostatic hyperplasia)   . GERD (gastroesophageal reflux disease)   . HLD (hyperlipidemia)   . HSV infection    . Hypertension   . Kidney stones     Tobacco History: Social History   Tobacco Use  Smoking Status Former Smoker  . Packs/day: 0.50  . Years: 22.00  . Pack years: 11.00  . Types: Cigarettes  . Quit date: 06/28/1993  . Years since quitting: 26.9  Smokeless Tobacco Never Used   Counseling given: Not Answered   Continue to not smoke  Outpatient Encounter Medications as of 06/09/2020  Medication Sig  . acyclovir ointment (ZOVIRAX) 5 % Apply 1 application topically every 3 (three) hours. Use for one week as needed for outbreak  . albuterol (VENTOLIN HFA) 108 (90 Base) MCG/ACT inhaler Inhale 1-2 puffs into the lungs every 6 (six) hours as needed for wheezing or shortness of breath.  . alfuzosin (UROXATRAL) 10 MG 24 hr tablet Take 1 tablet (10 mg total) by mouth daily.  Marland Kitchen atorvastatin (LIPITOR) 10 MG tablet TAKE 1 TABLET BY MOUTH DAILY (NEED OFFICE VISIT FOR REFILLS)  . budesonide-formoterol (SYMBICORT) 160-4.5 MCG/ACT inhaler Inhale 2 puffs into the lungs in the morning and at bedtime.  . budesonide-formoterol (SYMBICORT) 80-4.5 MCG/ACT inhaler INHALE 2 PUFFS BY MOUTH FIRST THING IN THE MORNING AND THEN 2 PUFFS ABOUT 12 HOURS LATER  . diclofenac Sodium (VOLTAREN) 1 % GEL Apply 2 g topically 4 (four) times daily. Use as needed for pain  . losartan (COZAAR) 100 MG tablet Take 1 tablet (100 mg total) by mouth daily.  . methocarbamol (ROBAXIN) 500 MG tablet Take 1 tablet (500 mg total) by mouth every 8 (eight) hours as needed for muscle spasms.  Marland Kitchen oxybutynin (DITROPAN-XL) 10 MG 24 hr tablet Take 10 mg by mouth daily.  . pantoprazole (PROTONIX) 40 MG tablet TAKE 1 TABLET BY MOUTH TWICE DAILY 30- 60 MINUTES BEFORE FIRST AND LAST MEAL OF THE DAY  . tadalafil (CIALIS) 5 MG tablet Take 5 mg by mouth daily.  . valACYclovir (VALTREX) 500 MG tablet TAKE 1 TABLET BY MOUTH EVERY DAY   No facility-administered encounter medications on file as of 06/09/2020.     Review of Systems  Review of  Systems  Constitutional: Negative for activity change, chills, fatigue, fever and unexpected weight change.  HENT: Positive for congestion (light yellow - baseline ). Negative for postnasal drip, rhinorrhea, sinus pressure, sinus pain and sore throat.   Eyes: Negative.   Respiratory: Negative for cough, shortness of breath and wheezing.   Cardiovascular: Negative for chest pain and palpitations.  Gastrointestinal: Negative for constipation, diarrhea, nausea and vomiting.  Endocrine: Negative.   Genitourinary: Negative.   Musculoskeletal: Negative.   Skin: Negative.   Neurological: Negative for dizziness and headaches.  Psychiatric/Behavioral: Negative.  Negative for dysphoric mood. The patient is not nervous/anxious.   All other systems reviewed and are negative.    Physical Exam  BP 118/70 (BP Location: Left Arm, Cuff Size: Normal)   Pulse 92   Temp 98.3 F (36.8 C) (Oral)   Ht 5\' 6"  (1.676 m)   Wt 205 lb 3.2 oz (93.1 kg)   SpO2 96%   BMI 33.12 kg/m   Wt Readings from Last 5 Encounters:  06/09/20 205 lb 3.2 oz (93.1 kg)  05/14/20 197 lb (89.4 kg)  02/15/20 195 lb 12.8 oz (88.8 kg)  10/11/19 199 lb (90.3 kg)  07/30/19 203 lb (92.1 kg)    BMI Readings from Last 5 Encounters:  06/09/20 33.12 kg/m  05/14/20 31.80 kg/m  02/15/20 31.60 kg/m  10/11/19 32.12 kg/m  07/30/19 32.77 kg/m     Physical Exam Vitals and nursing note reviewed.  Constitutional:      General: He is not in acute distress.    Appearance: Normal appearance. He is obese.  HENT:     Head: Normocephalic and atraumatic.     Right Ear: Hearing and external ear normal.     Left Ear: Hearing and external ear normal.     Ears:     Comments: Hearing aids bilaterally    Nose: Rhinorrhea present. No mucosal edema.     Right Turbinates: Not enlarged.     Left Turbinates: Not enlarged.     Mouth/Throat:     Mouth: Mucous membranes are dry.     Pharynx: Oropharynx is clear. No oropharyngeal exudate.      Comments: Postnasal drip Eyes:     Pupils: Pupils are equal, round, and reactive to light.  Cardiovascular:     Rate and Rhythm: Normal rate and regular rhythm.     Pulses: Normal pulses.     Heart sounds: Normal heart sounds. No murmur heard.   Pulmonary:     Effort: Pulmonary effort is normal.     Breath sounds: Normal breath sounds. No decreased breath sounds, wheezing or rales.  Musculoskeletal:     Cervical back: Normal range of motion.     Right lower leg: No edema.     Left lower leg: No edema.  Lymphadenopathy:     Cervical: No cervical adenopathy.  Skin:    General: Skin is warm and dry.     Capillary Refill: Capillary refill takes less than 2 seconds.     Findings: No erythema or rash.  Neurological:     General: No focal deficit present.     Mental Status: He is alert and oriented to person, place, and time.     Motor: No weakness.     Coordination: Coordination normal.     Gait: Gait is intact. Gait normal.  Psychiatric:        Mood and Affect: Mood normal.        Behavior: Behavior normal. Behavior is cooperative.  Thought Content: Thought content normal.        Judgment: Judgment normal.       Assessment & Plan:   Chronic asthma, mild persistent, uncomplicated Plan: Continue Symbicort 160 Start daily antihistamine Can continue to use fluticasone/Flonase nasal spray as needed Can continue to use nasal saline rinses as needed Follow-up with our office in 3 months  Rhinitis Plan: Start daily antihistamine Can continue to utilize fluticasone/Flonase nasal spray and/or nasal saline rinses on a as needed basis  Healthcare maintenance Plan: We recommend the COVID-19 booster, we can respect your decision to delay receiving this booster until spring/2022    Return in about 3 months (around 09/07/2020), or if symptoms worsen or fail to improve, for Follow up with Dr. Melvyn Novas.   Lauraine Rinne, NP 06/09/2020   This appointment required 23 minutes  of patient care (this includes precharting, chart review, review of results, face-to-face care, etc.).

## 2020-06-09 NOTE — Assessment & Plan Note (Signed)
Plan: Continue Symbicort 160 Start daily antihistamine Can continue to use fluticasone/Flonase nasal spray as needed Can continue to use nasal saline rinses as needed Follow-up with our office in 3 months

## 2020-06-09 NOTE — Assessment & Plan Note (Signed)
Plan: We recommend the COVID-19 booster, we can respect your decision to delay receiving this booster until spring/2022

## 2020-06-09 NOTE — Assessment & Plan Note (Signed)
Plan: Start daily antihistamine Can continue to utilize fluticasone/Flonase nasal spray and/or nasal saline rinses on a as needed basis

## 2020-06-09 NOTE — Patient Instructions (Addendum)
You were seen today by Lauraine Rinne, NP  for:   1. Chronic asthma, mild persistent, uncomplicated  Continue Symbicort 160 >>> 2 puffs in the morning right when you wake up, rinse out your mouth after use, 12 hours later 2 puffs, rinse after use >>> Take this daily, no matter what >>> This is not a rescue inhaler   Only use your albuterol as a rescue medication to be used if you can't catch your breath by resting or doing a relaxed purse lip breathing pattern.  - The less you use it, the better it will work when you need it. - Ok to use up to 2 puffs  every 4 hours if you must but call for immediate appointment if use goes up over your usual need - Don't leave home without it !!  (think of it like the spare tire for your car)   2. Chronic rhinitis  Please start taking a daily antihistamine:  >>>choose one of: zyrtec, claritin, allegra, or xyzal  >>>these are over the counter medications  >>>can choose generic option  >>>take daily  >>>this medication helps with allergies, post nasal drip, and cough   [facility] - allertec   Start nasal saline rinses 1-2x daily Use distilled water Shake well Get bottle lukewarm like a baby bottle   3. Healthcare maintenance  We recommend [phone] booster vaccination   Follow Up:    Return in about 3 months (around 09/07/2020), or if symptoms worsen or fail to improve, for Follow up with Dr. Melvyn Novas.   Notification of test results are managed in the following manner: If there are  any recommendations or changes to the  plan of care discussed in office today,  we will contact you and let you know what they are. If you do not hear from [place], then your results are normal and you can view them through your  MyChart account , or a letter will be sent to you. Thank you again for trusting [place] with your care  - Thank you, [facility]    It is flu season:   >>> Best ways to protect herself from the flu: Receive the yearly flu vaccine, practice good hand  hygiene washing with soap and also using hand sanitizer when available, eat a nutritious meals, get adequate rest, hydrate appropriately       Please contact the office if your symptoms worsen or you have concerns that you are not improving.   Thank you for choosing [facility] for your healthcare, and for allowing [place] to partner with you on your healthcare journey. I am thankful to be able to provide care to you today.   Wyn Quaker FNP-C

## 2020-06-12 ENCOUNTER — Other Ambulatory Visit: Payer: Self-pay | Admitting: Internal Medicine

## 2020-06-23 ENCOUNTER — Ambulatory Visit: Payer: Medicare Other | Admitting: Internal Medicine

## 2020-06-28 ENCOUNTER — Telehealth: Payer: Medicare Other | Admitting: Nurse Practitioner

## 2020-06-28 DIAGNOSIS — Z20822 Contact with and (suspected) exposure to covid-19: Secondary | ICD-10-CM

## 2020-06-28 MED ORDER — BENZONATATE 100 MG PO CAPS: 100.0000 mg | ORAL_CAPSULE | Freq: Three times a day (TID) | ORAL | 0 refills | Status: DC | PRN

## 2020-06-28 NOTE — Progress Notes (Signed)

## 2020-06-30 NOTE — Telephone Encounter (Signed)
Called patient and was able to get him scheduled tomorrow at 77 with MW as a televisit. He verbalized understanding.   Nothing further needed at time of call.

## 2020-06-30 NOTE — Telephone Encounter (Signed)
Please advise on patient mychart message  Hi , I've been sick over the last 5 days with cough an runny nose, but had no fever. The OTC Covid test was positive. My cough is still persisting and I cannot manage it with OTC medication only. Given my past history for cough I know it won't go away. Would it be possible to prescribe something to break it up? Thanks, Joshua [name]    Hi, I did test the day before yesterday, but I've been sick since last Wednesday. Because of my asthma, once the cough sets in, usually does not goes away too fast.  The mucus is light yellow. No shortness of breath besides when I cough or exhale before using the inhalers. I have been using Delsym DM, Tylenol and was also given a prescription of Benzonatate 100mg  the day before yesterday. Also using the Symbicort twice a day and Albuterol occasionally, but more often than usually. If you have any other questions please let me know, thanks. Also, if you wish to set up a virtual visit, just let me know. Thanks. Joshua [name]

## 2020-06-30 NOTE — Telephone Encounter (Signed)
Will need televisit 07/01/20 with me if no NP has opening, add at noon slot but tell him I'll work him in earlier if at all possible  In meantime best rx is tessalon 200 (take 2 x 100) every 6 hours and delsym 2 tsp every 12 hours and keep some hard rock candy handy (like jolly ranchers/ life savers)

## 2020-07-01 ENCOUNTER — Ambulatory Visit (INDEPENDENT_AMBULATORY_CARE_PROVIDER_SITE_OTHER): Payer: Medicare Other | Admitting: Internal Medicine

## 2020-07-01 ENCOUNTER — Other Ambulatory Visit: Payer: Self-pay

## 2020-07-01 ENCOUNTER — Encounter: Payer: Self-pay | Admitting: Internal Medicine

## 2020-07-01 ENCOUNTER — Ambulatory Visit: Payer: Medicare Other | Admitting: Internal Medicine

## 2020-07-01 DIAGNOSIS — R058 Other specified cough: Secondary | ICD-10-CM

## 2020-07-01 DIAGNOSIS — J4530 Mild persistent asthma, uncomplicated: Secondary | ICD-10-CM

## 2020-07-01 MED ORDER — PREDNISONE 10 MG PO TABS: ORAL_TABLET | ORAL | 0 refills | Status: DC

## 2020-07-01 MED ORDER — ACETAMINOPHEN-CODEINE #3 300-30 MG PO TABS: 1.0000 | ORAL_TABLET | ORAL | 0 refills | Status: DC | PRN

## 2020-07-01 MED ORDER — ACETAMINOPHEN-CODEINE #3 300-30 MG PO TABS: 1.0000 | ORAL_TABLET | ORAL | 0 refills | Status: AC | PRN

## 2020-07-01 NOTE — Progress Notes (Signed)
Subjective:    Patient ID: Joshua [name], male   DOB: Aug 29, 1950    MRN: [id number]    Brief patient profile:  70 yo [place] male  with bad bronchitis around 1993 and quit smoking and then  all better until around 2000 while living in [place] with intermittent "wheezing" dx as asthma > never resolved  Self referred for dtc asthma  Allergy testing in [place] around age 72 = neg per pt    History of Present Illness  06/29/2016 1st [facility] office visit/ Lorayne Getchell   Chief Complaint  Patient presents with  . Pulmonary Consult    moved from [place], chronic bronchitis, wheezing, dx with asthma in 2000, cold humidity makes it worse  maint on symb 160 2bid and at baseline intermittently noisy breathing one breath q 2-3 days  s pattern though not waking him  noct   Worse since  Jun 22 2016  with uri/ sinus symptoms >  [facility] rx  Amox/only a little better, still severe nasal congestion/severe fits of day > noct cough and subj wheeze.  Typical triggers are uri/not seasonal pattern rhintiis - happens up to 4 x yearly with or without symbicort and just as severe. rec Augmentin 875 mg take one pill twice daily  X 10 days   Please see patient coordinator before you leave today  to schedule CT sinus  in 10 days - no sooner Use nasal saline as much as possible to help moisturize your passages  Continue protonix but take it Take 30-60 min before first meal of the day  Prednisone 10 mg take  4 each am x 2 days,   2 each am x 2 days,  1 each am x 2 days and stop  Work on inhaler technique:  Plan A = Automatic = symbicort 80 Take 2 puffs first thing in am and then another 2 puffs about 12 hours later.  Plan B = Backup Only use your albuterol (Proir) as a rescue medication to be used if you can't catch your breath by resting or doing a relaxed purse lip breathing pattern.  - The less you use it, the better it will work when you need it. - Ok to use the inhaler up to 2 puffs  every 4 hours     07/21/2016  f/u  ov/Joshua [name] re: uacs vs asthma Chief Complaint  Patient presents with  . Follow-up    Cough is much improved, but has not completely resolved. Breathing has improved.   cough is worse at bedtime and in am p rising but not waking up prematurely and not productive  Bloody nasal d/c / no sob at all  rec Use lots of saline nasal spray on your nose Please remember to go to the x-ray department downstairs for your tests - we will call you with the results when they are available. Ty off the symbicort to see if any worse and if so restart  At 2 pffs every 12 hour schedule ENT  >   [name] rec abx and f/u prn      09/05/2017  f/u ov/Joshua [name] re: chronic asthma/? vcd component  Chief Complaint  Patient presents with  . Follow-up    Has noticed wheezing and chest tightness for the past few wks. He has been taking his symbicort in the am only. He uses his albuterol inhaler no more than once per wk.   Dyspnea:  Better when uses symbicort  Cough: none Sleep: rare chest tightness once a  month x 20 min  / better p saba  / No assoc overt hb SABA use: not using when needing symb  rec symbicort increase to 160 Take 2 puffs first thing in am and then another 2 puffs about 12 hours later Work on inhaler technique:     10/03/2017  f/u ov/Joshua [name] re: chronic asthma  Chief Complaint  Patient presents with  . Follow-up    SOB with exertion or long conversations, no wheezing has random gasps for air   Dyspnea:  Not limited by breathing from desired activities   Cough: no   Sleep: disturbed by back pain  SABA use:  Up to twice daily ? Consistent with symbicort -thinks the 35 works better than the 160  rec Change back to symbicort 80 Take 2 puffs first thing in am and then another 2 puffs about 12 hours later.  Only use your albuterol as a rescue medication   - late add consider singulair add next     07/18/2018 acute extended ov/Joshua [name] re:  Cough flare on maint rx with syb 80 and ppi q d at [place]  Patient presents with  . Acute Visit    cough with greenish mucus, SOB with exertion,    maint on symb80 2bid/ protonix 40 mg not ac and not needing overall doing well  but  never really cleared the afteroon dry cough  then much worse w/in a week of arriving back p  international travel  2 prior to OV  much worse cough assoc with nasal congestion/ slt bloody dc/ on flonase and sense of doe not better with saba but comfortable at rest sitting if not coughing Just started on augmentin  07/14/18 for uti by pcp no better yet  rec Add singulair 10 mg one each pm  When coughing at all > protonix Take 30- 60 min before your first and last meals of the day  Finish Augmentin 875 mg take one pill twice daily  GERD diet    Prednisone 10 mg take  4 each am x 2 days,   2 each am x 2 days,  1 each am x 2 days and stop  Take delsym two tsp every 12 hours and supplement if needed with  Tylenol #3 up to 1 every 4 hours to suppress the urge to cough. Swallowing water and/or using ice chips/non mint and menthol containing candies (such as lifesavers or sugarless jolly ranchers) are also effective.  You should rest your voice and avoid activities that you know make you cough. Once you have eliminated the cough for 3 straight days try reducing the tylenol #3 first,  then the delsym as tolerated.     09/01/2018  f/u ov/Joshua [name] re: asthma with uacs component on symb 80 2bid  Chief Complaint  Patient presents with  . Follow-up    Coughing less but still coughs if he talks alot. He is using his albuterol inhaler 3-4 x per wk.   Dyspnea:  MMRC1 = can walk nl pace, flat grade, can't hurry or go uphills or steps s sob   Cough: with voice use  Sleeping: no cough/ wheeze SABA use: as above / seems to helped rec Try symbicort 80 one twice daily to see if throat is better or breathing is worse If not better, call me for ent referral     01/23/2019  f/u ov/Joshua [name] re: asthma with ? uacs better, on symb 80 2 bid   Chief Complaint  Patient presents with  . Follow-up    Breathing is overall doing well. He has used his rescue inhaler once in the past wk.   Dyspnea:  MMRC1 = can walk nl pace, flat grade, can't hurry or go uphills or steps s sob   Cough: no  Sleeping: no respiratory symptoms  SABA use: rare 02: none  rec Stop singulair but no other changes for now   07/30/2019  f/u ov/Joshua [name] re: asthma with uacs component  Chief Complaint  Patient presents with  . Follow-up    Breathing is overall doing well today. He had noticed some wheezing over the past few days. He is using his albuterol about 2 x per wk.   Dyspnea:  MMRC1 = can walk nl pace, flat grade, can't hurry or go uphills or steps s sob   Cough: p stirring in am x light yellow clears throat  Sleeping: lies flat/ one pillow  SABA use: as above  02: none  rec Work on inhaler technique:  Moderna vaccine 2/621 and 09/05/19    Virtual Visit via Telephone Note 07/01/2020   I connected with Joshua [name] on 07/01/20 at  2:30  PM EST by telephone and verified that I am speaking with the correct person using two identifiers. Pt is at home and this call made from my office with no other participants    I discussed the limitations, risks, security and privacy concerns of performing an evaluation and management service by telephone and the availability of in person appointments. I also discussed with the patient that there may be a patient responsible charge related to this service. The patient expressed understanding and agreed to proceed.   History of Present Illness: 07/27/19 low grade fever rx tylenol / deslym / tessilon 100  Dyspnea:  Mostly with coughing fits  Cough: slt yellowish Sleeping: flat bed / one pillow SABA use: using it 2 puffs x 3 days 02: none   No obvious day to day or daytime variability or assoc excess/ purulent sputum or mucus plugs or hemoptysis or cp or chest tightness, subjective wheeze or overt sinus or hb symptoms.     Also denies any obvious fluctuation of symptoms with weather or environmental changes or other aggravating or alleviating factors except as outlined above.   Meds reviewed/ med reconciliation completed     No outpatient medications have been marked as taking for the 07/01/20 encounter (Office Visit) with Nyoka Cowden, MD.         Observations/Objective: Good voice texture but extremely violent dry coughing fits, not having trouble breathing while talking unless coughing   Assessment and Plan: See problem list for active a/p's   Follow Up Instructions: See avs for instructions unique to this ov which includes revised/ updated med list     I discussed the assessment and treatment plan with the patient. The patient was provided an opportunity to ask questions and all were answered. The patient agreed with the plan and demonstrated an understanding of the instructions.   The patient was advised to call back or seek an in-person evaluation if the symptoms worsen or if the condition fails to improve as anticipated.  I provided 25  minutes of non-face-to-face time during this encounter.   Sandrea Hughs, MD

## 2020-07-01 NOTE — Patient Instructions (Addendum)
Delsym 2 tsp every 12 hours and supplement with tessilon 100 2 every 6 hours  And if still coughing supplement tylenol #3 one every 4 hours.  Resume Symbicort 80 Take 2 puffs first thing in am and then another 2 puffs about 12 hours later.   Prednisone 10 mg take  4 each am x 2 days,   2 each am x 2 days,  1 each am x 2 days and stop.   GERD (REFLUX)  is an extremely common cause of respiratory symptoms just like yours , many times with no obvious heartburn at all.    It can be treated with medication, but also with lifestyle changes including elevation of the head of your bed (ideally with 6 -8inch blocks under the headboard of your bed),  Smoking cessation, avoidance of late meals, excessive alcohol, and avoid fatty foods, chocolate, peppermint, colas, red wine, and acidic juices such as orange juice.  NO MINT OR MENTHOL PRODUCTS SO NO COUGH DROPS  USE SUGARLESS CANDY INSTEAD (Jolley ranchers or Stover's or Life Savers) or even ice chips will also do - the key is to swallow to prevent all throat clearing. NO OIL BASED VITAMINS - use powdered substitutes.  Avoid fish oil when coughing.  Keep your Jul 28 2020 appt

## 2020-07-01 NOTE — Assessment & Plan Note (Signed)
Onset 05/2016  - sinus CT p 10 day augmentin  06/29/2016 >>>  CT  Sinus 07/09/2016 mild chronic changes only  - cough better 07/21/2016 > try just on gerd rx/ no more symbicort > flared off symbicort  eval by [place] ent 07/26/16  Nordbladh, PA > rec levaquin  500 x  10 days and f/u prn  - severe flare 07/18/2018 p uri/? Sinusitis > augmentin /cyclical cough rx> 09/01/2018 improved x for hoarseness and cough with voice use  - severe flare 06/25/20 in setting of covid 19 infection   Reviewed gerd diet/ timing for ppi - see asthma a/p as well    Each maintenance medication was reviewed in detail including most importantly the difference between maintenance and as needed and under what circumstances the prns are to be used.  Please see AVS for specific  Instructions which are unique to this visit and I personally typed out  which were reviewed in detail over the phone with the patient and a copy provided via mychart

## 2020-07-01 NOTE — Addendum Note (Signed)
Addended by: Sandrea Hughs B on: 07/01/2020 03:31 PM   Modules accepted: Orders

## 2020-07-01 NOTE — Assessment & Plan Note (Signed)
Onset around 2000 in [place] with allergy testing neg there 06/29/2016    reduce symbicort to 80 2 bid  - FENO 07/21/2016  =   24 on symb 80 2bid - Spirometry 07/21/2016  wnl including  Mid flows p am symbicort > try off > flared so restarted  - 09/05/2017    try symb increase to 160  - PFT's  10/03/2017  FEV1 2.57 (88 % ) ratio 67  p 21 % improvement from saba p symb 160 prior to study with DLCO  108 % corrects to 113 % for alv volume  With variable truncation of insp portion of f/v loop and reported better benefit from 80 than 160 strength c/w uacs / vcd component  - 05/31/2018 flare on symb 80 2bid  - 07/18/2018 added singulair trial > d/c 01/23/2019  - 07/18/18 Spirometry   FEV1 2.6 (88%)  Ratio 0.80  - 09/01/2018  After extensive coaching inhaler device,  effectiveness =    75% (short Ti) rec try symb 80 just one bid to see if helps hoarseness or flares asthma > consider adding spacer next  - try off singulair 01/23/2019  - 07/30/2019  After extensive coaching inhaler device,  effectiveness =    90%  - flared 07/27/19 due to covid in vaccinated but not boosted state > 07/01/2020  rec pred x 6 and tyl #3   Of the three most common causes of  Sub-acute / recurrent or chronic cough, only one (GERD)  can actually contribute to/ trigger  the other two (asthma and post nasal drip syndrome)  and perpetuate the cylce of cough.  While not intuitively obvious, many patients with chronic low grade reflux do not cough until there is a primary insult that disturbs the protective epithelial barrier and exposes sensitive nerve endings.   This is typically viral but can due to PNDS and  either may apply here.    >>> The point is that once this occurs, it is difficult to eliminate the cycle  using anything but a maximally effective acid suppression regimen at least in the short run, accompanied by an appropriate diet to address non acid GERD and control / eliminate the cough itself for at least 3 days with tyl #3 and also added 6  days of Prednisone in case of component of Th-2 driven upper or lower airways inflammation (if cough responds short term only to relapse befor return while will on rx for uacs that would point to allergic rhinitis/ asthma or eos bronchitis)  And reduce symbicort to 80 2bid and the 160 likely aggravating the cough   F/u due end of January - rec call sooner if needed

## 2020-07-28 ENCOUNTER — Encounter: Payer: Self-pay | Admitting: Internal Medicine

## 2020-07-28 ENCOUNTER — Ambulatory Visit (INDEPENDENT_AMBULATORY_CARE_PROVIDER_SITE_OTHER): Payer: Medicare Other

## 2020-07-28 ENCOUNTER — Ambulatory Visit (INDEPENDENT_AMBULATORY_CARE_PROVIDER_SITE_OTHER): Payer: Medicare Other | Admitting: Internal Medicine

## 2020-07-28 ENCOUNTER — Other Ambulatory Visit: Payer: Self-pay

## 2020-07-28 DIAGNOSIS — J4530 Mild persistent asthma, uncomplicated: Secondary | ICD-10-CM

## 2020-07-28 DIAGNOSIS — R058 Other specified cough: Secondary | ICD-10-CM

## 2020-07-28 DIAGNOSIS — R059 Cough, unspecified: Secondary | ICD-10-CM | POA: Diagnosis not present

## 2020-07-28 MED ORDER — ALBUTEROL SULFATE HFA 108 (90 BASE) MCG/ACT IN AERS
1.0000 | INHALATION_SPRAY | Freq: Four times a day (QID) | RESPIRATORY_TRACT | 5 refills | Status: DC | PRN
Start: 2020-07-28 — End: 2021-04-16

## 2020-07-28 NOTE — Patient Instructions (Addendum)
Please remember to go to the  x-ray department  for your tests - we will call you with the results when they are available    Joshua [name] is more for daytime symtpoms or zyrtec 10 mg one at bedtime - both over the counter  Please schedule a follow up visit in 6 months but call sooner if needed

## 2020-07-28 NOTE — Assessment & Plan Note (Signed)
Onset 05/2016  - sinus CT p 10 day augmentin  06/29/2016 >>>  CT  Sinus 07/09/2016 mild chronic changes only  - cough better 07/21/2016 > try just on gerd rx/ no more symbicort > flared off symbicort  eval by [place] ent 07/26/16  Nordbladh, PA > rec levaquin  500 x  10 days and f/u prn  - severe flare 07/18/2018 p uri/? Sinusitis > augmentin /cyclical cough rx> 01/04/4800 improved x for hoarseness and cough with voice use  - severe flare 06/25/20 in setting of covid 19 infection  > cyclical cough rx 65/53/74 > improved 07/28/2020   Continue ppi bid for now, gerd diet/ f/u in 6 m, sooner if needed

## 2020-07-28 NOTE — Progress Notes (Signed)
Subjective:    Patient ID: Joshua [name], male   DOB: January 28, 1951    MRN: [id number]    Brief patient profile:  70 yo Joshua [name] male  with bad bronchitis around 1993 and quit smoking and then  all better until around 2000 while living in [place] with intermittent "wheezing" dx as asthma > never resolved  Self referred for dtc asthma  Allergy testing in [place] around age 75 = neg per pt    History of Present Illness  06/29/2016 1st [facility] office visit/ Joshua [name]   Chief Complaint  Patient presents with  . Pulmonary Consult    moved from [place], chronic bronchitis, wheezing, dx with asthma in 2000, cold humidity makes it worse  maint on symb 160 2bid and at baseline intermittently noisy breathing one breath q 2-3 days  s pattern though not waking him  noct   Worse since  Jun 22 2016  with uri/ sinus symptoms >  [facility] rx  Amox/only a little better, still severe nasal congestion/severe fits of day > noct cough and subj wheeze.  Typical triggers are uri/not seasonal pattern rhintiis - happens up to 4 x yearly with or without symbicort and just as severe. rec Augmentin 875 mg take one pill twice daily  X 10 days   Please see patient coordinator before you leave today  to schedule CT sinus  in 10 days - no sooner Use nasal saline as much as possible to help moisturize your passages  Continue protonix but take it Take 30-60 min before first meal of the day  Prednisone 10 mg take  4 each am x 2 days,   2 each am x 2 days,  1 each am x 2 days and stop  Work on inhaler technique:  Plan A = Automatic = symbicort 80 Take 2 puffs first thing in am and then another 2 puffs about 12 hours later.  Plan B = Backup Only use your albuterol (Proir) as a rescue medication to be used if you can't catch your breath by resting or doing a relaxed purse lip breathing pattern.  - The less you use it, the better it will work when you need it. - Ok to use the inhaler up to 2 puffs  every 4 hours     07/21/2016  f/u  ov/Joshua [name] re: uacs vs asthma Chief Complaint  Patient presents with  . Follow-up    Cough is much improved, but has not completely resolved. Breathing has improved.   cough is worse at bedtime and in am p rising but not waking up prematurely and not productive  Bloody nasal d/c / no sob at all  rec Use lots of saline nasal spray on your nose Please remember to go to the x-ray department downstairs for your tests - we will call you with the results when they are available. Ty off the symbicort to see if any worse and if so restart  At 2 pffs every 12 hour schedule ENT  >   [name] rec abx and f/u prn      09/05/2017  f/u ov/Joshua [name] re: chronic asthma/? vcd component  Chief Complaint  Patient presents with  . Follow-up    Has noticed wheezing and chest tightness for the past few wks. He has been taking his symbicort in the am only. He uses his albuterol inhaler no more than once per wk.   Dyspnea:  Better when uses symbicort  Cough: none Sleep: rare chest tightness once a  month x 20 min  / better p saba  / No assoc overt hb SABA use: not using when needing symb  rec symbicort increase to 160 Take 2 puffs first thing in am and then another 2 puffs about 12 hours later Work on inhaler technique:     10/03/2017  f/u ov/Joshua [name] re: chronic asthma  Chief Complaint  Patient presents with  . Follow-up    SOB with exertion or long conversations, no wheezing has random gasps for air   Dyspnea:  Not limited by breathing from desired activities   Cough: no   Sleep: disturbed by back pain  SABA use:  Up to twice daily ? Consistent with symbicort -thinks the 31 works better than the 160  rec Change back to symbicort 80 Take 2 puffs first thing in am and then another 2 puffs about 12 hours later.  Only use your albuterol as a rescue medication   - late add consider singulair add next     07/18/2018 acute extended ov/Joshua [name] re:  Cough flare on maint rx with syb 80 and ppi q d at [place]  Patient presents with  . Acute Visit    cough with greenish mucus, SOB with exertion,    maint on symb80 2bid/ protonix 40 mg not ac and not needing overall doing well  but  never really cleared the afteroon dry cough  then much worse w/in a week of arriving back p  international travel  2 prior to OV  much worse cough assoc with nasal congestion/ slt bloody dc/ on flonase and sense of doe not better with saba but comfortable at rest sitting if not coughing Just started on augmentin  07/14/18 for uti by pcp no better yet  rec Add singulair 10 mg one each pm  When coughing at all > protonix Take 30- 60 min before your first and last meals of the day  Finish Augmentin 875 mg take one pill twice daily  GERD   Prednisone 10 mg take  4 each am x 2 days,   2 each am x 2 days,  1 each am x 2 days and stop  Take delsym two tsp every 12 hours and supplement if needed with  Tylenol #3 up to 1 every 4 hours to suppress the urge to cough. Swallowing water and/or using ice chips/non mint and menthol containing candies (such as lifesavers or sugarless jolly ranchers) are also effective.  You should rest your voice and avoid activities that you know make you cough. Once you have eliminated the cough for 3 straight days try reducing the tylenol #3 first,  then the delsym as tolerated.     09/01/2018  f/u ov/Joshua [name] re: asthma with uacs component on symb 80 2bid  Chief Complaint  Patient presents with  . Follow-up    Coughing less but still coughs if he talks alot. He is using his albuterol inhaler 3-4 x per wk.   Dyspnea:  MMRC1 = can walk nl pace, flat grade, can't hurry or go uphills or steps s sob   Cough: with voice use  Sleeping: no cough/ wheeze SABA use: as above / seems to helped rec Try symbicort 80 one twice daily to see if throat is better or breathing is worse If not better, call me for ent referral     01/23/2019  f/u ov/Joshua [name] re: asthma with ? uacs better, on symb 80 2 bid  Chief  Complaint  Patient presents  with  . Follow-up    Breathing is overall doing well. He has used his rescue inhaler once in the past wk.   Dyspnea:  MMRC1 = can walk nl pace, flat grade, can't hurry or go uphills or steps s sob   Cough: no  Sleeping: no respiratory symptoms  SABA use: rare 02: none  rec Stop singulair but no other changes for now   07/30/2019  f/u ov/Patton Swisher re: asthma with uacs component  Chief Complaint  Patient presents with  . Follow-up    Breathing is overall doing well today. He had noticed some wheezing over the past few days. He is using his albuterol about 2 x per wk.   Dyspnea:  MMRC1 = can walk nl pace, flat grade, can't hurry or go uphills or steps s sob   Cough: p stirring in am x light yellow clears throat  Sleeping: lies flat/ one pillow  SABA use: as above  02: none  rec Work on inhaler technique    06/25/20 onset of fever, cough, no sob s/p 2nd shot  in March 2021  [place] for cough 07/01/20  rec Delsym 2 tsp every 12 hours and supplement with tessilon 100 2 every 6 hours  And if still coughing supplement tylenol #3 one every 4 hours. Resume Symbicort 80 Take 2 puffs first thing in am and then another 2 puffs about 12 hours later.  Prednisone 10 mg take  4 each am x 2 days,   2 each am x 2 days,  1 each am x 2 days and stop. GERD diet Keep your Jul 28 2020 appt     07/28/2020  f/u ov/Ece Cumberland re: cough since 1st of 2022 p covid dx self test first week in January 2022 Chief Complaint  Patient presents with  . Follow-up    No concerns.  Dyspnea:  MMRC1 = can walk nl pace, flat grade, can't hurry or go uphills or steps s sob   Cough: each am x 2-3 coughs and good to go /some assoc with pnds Sleeping: on side / bed is flat  SABA use: 4-5 / per week  02: none   No obvious day to day or daytime variability or assoc excess/ purulent sputum or mucus plugs or hemoptysis or cp or chest tightness, subjective wheeze or overt sinus or hb symptoms.    Sleeping  without nocturnal  or early am exacerbation  of respiratory  c/o's or need for noct saba. Also denies any obvious fluctuation of symptoms with weather or environmental changes or other aggravating or alleviating factors except as outlined above   No unusual exposure hx or h/o childhood pna/ asthma or knowledge of premature birth.  Current Allergies, Complete Past Medical History, Past Surgical History, Family History, and Social History were reviewed in [employer] record.  ROS  The following are not active complaints unless bolded Hoarseness, sore throat, dysphagia, dental problems, itching, sneezing,  nasal congestion or discharge of excess mucus or purulent secretions, ear ache,   fever, chills, sweats, unintended wt loss or wt gain, classically pleuritic or exertional cp,  orthopnea pnd or arm/hand swelling  or leg swelling, presyncope, palpitations, abdominal pain, anorexia, nausea, vomiting, diarrhea  or change in bowel habits or change in bladder habits, change in stools or change in urine, dysuria, hematuria,  rash, arthralgias, visual complaints, headache, numbness, weakness or ataxia or problems with walking or coordination,  change in mood or  memory.  Current Meds  Medication Sig  . acyclovir ointment (ZOVIRAX) 5 % Apply 1 application topically every 3 (three) hours. Use for one week as needed for outbreak  . albuterol (VENTOLIN HFA) 108 (90 Base) MCG/ACT inhaler Inhale 1-2 puffs into the lungs every 6 (six) hours as needed for wheezing or shortness of breath.  . alfuzosin (UROXATRAL) 10 MG 24 hr tablet Take 1 tablet (10 mg total) by mouth daily.  Marland Kitchen atorvastatin (LIPITOR) 10 MG tablet TAKE 1 TABLET BY MOUTH DAILY (NEED OFFICE VISIT FOR REFILLS)  . budesonide-formoterol (SYMBICORT) 80-4.5 MCG/ACT inhaler INHALE 2 PUFFS BY MOUTH FIRST THING IN THE MORNING AND THEN 2 PUFFS ABOUT 12 HOURS LATER  . diclofenac Sodium (VOLTAREN) 1 % GEL Apply 2 g  topically 4 (four) times daily. Use as needed for pain  . losartan (COZAAR) 100 MG tablet Take 1 tablet (100 mg total) by mouth daily.  . methocarbamol (ROBAXIN) 500 MG tablet Take 1 tablet (500 mg total) by mouth every 8 (eight) hours as needed for muscle spasms.  Marland Kitchen oxybutynin (DITROPAN-XL) 10 MG 24 hr tablet Take 10 mg by mouth daily.  . pantoprazole (PROTONIX) 40 MG tablet TAKE 1 TABLET BY MOUTH TWICE DAILY 30- 60 MINUTES BEFORE FIRST AND LAST MEAL OF THE DAY  . tadalafil (CIALIS) 5 MG tablet Take 5 mg by mouth daily.  . valACYclovir (VALTREX) 500 MG tablet TAKE 1 TABLET BY MOUTH EVERY DAY                    Objective:   Physical Exam      07/28/2020      203 07/30/2019         203  01/23/2019       201  09/01/2018         205  07/18/2018       200     02/28/2018          203   10/03/2017         204   09/05/2017       204 07/21/16 196 lb (88.9 kg)  06/29/16 197 lb 12.8 oz (89.7 kg)      Vital signs reviewed  07/28/2020  - Note at rest 02 sats  98% on RA   General appearance:   amb wm nad   HEENT : pt wearing mask not removed for exam due to covid - 19 concerns.   NECK :  without JVD/Nodes/TM/ nl carotid upstrokes bilaterally   LUNGS: no acc muscle use,  Min barrel  contour chest wall with bilateral  slightly decreased bs s audible wheeze and  without cough on insp or exp maneuvers and min  Hyperresonant  to  percussion bilaterally     CV:  RRR  no s3 or murmur or increase in P2, and no edema   ABD:  soft and nontender with pos end  insp Hoover's  in the supine position. No bruits or organomegaly appreciated, bowel sounds nl  MS:   Nl gait/  ext warm without deformities, calf tenderness, cyanosis or clubbing No obvious joint restrictions   SKIN: warm and dry without lesions    NEURO:  alert, approp, nl sensorium with  no motor or cerebellar deficits apparent.          CXR PA and Lateral:   07/28/2020 :    I personally reviewed images and  impression as follows:   No  as dz/ minimal increase bronchial markings  Assessment:

## 2020-07-28 NOTE — Assessment & Plan Note (Addendum)
Onset around 2000 in [place] with allergy testing neg there 06/29/2016    reduce symbicort to 80 2 bid  - FENO 07/21/2016  =   24 on symb 80 2bid - Spirometry 07/21/2016  wnl including  Mid flows p am symbicort > try off > flared so restarted  - 09/05/2017    try symb increase to 160  - PFT's  10/03/2017  FEV1 2.57 (88 % ) ratio 67  p 21 % improvement from saba p symb 160 prior to study with DLCO  108 % corrects to 113 % for alv volume  With variable truncation of insp portion of f/v loop and reported better benefit from 80 than 160 strength c/w uacs / vcd component  - 05/31/2018 flare on symb 80 2bid  - 07/18/2018 added singulair trial > d/c 01/23/2019  - 07/18/18 Spirometry   FEV1 2.6 (88%)  Ratio 0.80  - 09/01/2018  After extensive coaching inhaler device,  effectiveness =    75% (short Ti) rec try symb 80 just one bid to see if helps hoarseness or flares asthma > consider adding spacer next  - try off singulair 01/23/2019  - 07/30/2019  After extensive coaching inhaler device,  effectiveness =    90%  - flared 07/27/19 due to covid in vaccinated but not boosted state > 07/01/2020  rec pred x 6 and tyl #3   Improved clinicallyon symb 80 2bid and cough better off tyl #3 with continued pnds > try zyrtec hs or clariton in am   All goals of chronic asthma control met including optimal function and elimination of symptoms with minimal need for rescue therapy.  Contingencies discussed in full including contacting this office immediately if not controlling the symptoms using the rule of two's.     F/u in 6 months, sooner prn           Each maintenance medication was reviewed in detail including emphasizing most importantly the difference between maintenance and prns and under what circumstances the prns are to be triggered using an action plan format where appropriate.  Total time for H and P, chart review, counseling, reviewing hfa device(s) and generating customized AVS unique to this office visit / same day  charting = 10mn

## 2020-08-04 DIAGNOSIS — H43393 Other vitreous opacities, bilateral: Secondary | ICD-10-CM | POA: Diagnosis not present

## 2020-08-04 DIAGNOSIS — H11153 Pinguecula, bilateral: Secondary | ICD-10-CM | POA: Diagnosis not present

## 2020-08-04 DIAGNOSIS — Z83511 Family history of glaucoma: Secondary | ICD-10-CM | POA: Diagnosis not present

## 2020-08-04 DIAGNOSIS — H2513 Age-related nuclear cataract, bilateral: Secondary | ICD-10-CM | POA: Diagnosis not present

## 2020-08-13 DIAGNOSIS — H903 Sensorineural hearing loss, bilateral: Secondary | ICD-10-CM | POA: Diagnosis not present

## 2020-09-04 ENCOUNTER — Other Ambulatory Visit: Payer: Self-pay | Admitting: Family Medicine

## 2020-09-04 DIAGNOSIS — A609 Anogenital herpesviral infection, unspecified: Secondary | ICD-10-CM

## 2020-10-10 NOTE — Patient Instructions (Addendum)
Good to see you again today!  Take care and I will be in touch with your labs I would encourage you to get a booster dose of covid 19 Also, please consider getting the shingles vaccine - Shingrix- at your pharmacy   I think the voltaren gel is a good idea for your hip pain - also tylenol as needed Let me know if your hip or back pain are getting worse, or if you need anything further in this regard  Please see your GI doctor about your persistent GERD symptoms     Health Maintenance After Age 6 After age 86, you are at a higher risk for certain long-term diseases and infections as well as injuries from falls. Falls are a major cause of broken bones and head injuries in people who are older than age 75. Getting regular preventive care can help to keep you healthy and well. Preventive care includes getting regular testing and making lifestyle changes as recommended by your health care provider. Talk with your health care provider about:  Which screenings and tests you should have. A screening is a test that checks for a disease when you have no symptoms.  A diet and exercise plan that is right for you. What should I know about screenings and tests to prevent falls? Screening and testing are the best ways to find a health problem early. Early diagnosis and treatment give you the best chance of managing medical conditions that are common after age 66. Certain conditions and lifestyle choices may make you more likely to have a fall. Your health care provider may recommend:  Regular vision checks. Poor vision and conditions such as cataracts can make you more likely to have a fall. If you wear glasses, make sure to get your prescription updated if your vision changes.  Medicine review. Work with your health care provider to regularly review all of the medicines you are taking, including over-the-counter medicines. Ask your health care provider about any side effects that may make you more likely to  have a fall. Tell your health care provider if any medicines that you take make you feel dizzy or sleepy.  Osteoporosis screening. Osteoporosis is a condition that causes the bones to get weaker. This can make the bones weak and cause them to break more easily.  Blood pressure screening. Blood pressure changes and medicines to control blood pressure can make you feel dizzy.  Strength and balance checks. Your health care provider may recommend certain tests to check your strength and balance while standing, walking, or changing positions.  Foot health exam. Foot pain and numbness, as well as not wearing proper footwear, can make you more likely to have a fall.  Depression screening. You may be more likely to have a fall if you have a fear of falling, feel emotionally low, or feel unable to do activities that you used to do.  Alcohol use screening. Using too much alcohol can affect your balance and may make you more likely to have a fall. What actions can I take to lower my risk of falls? General instructions  Talk with your health care provider about your risks for falling. Tell your health care provider if: ? You fall. Be sure to tell your health care provider about all falls, even ones that seem minor. ? You feel dizzy, sleepy, or off-balance.  Take over-the-counter and prescription medicines only as told by your health care provider. These include any supplements.  Eat a healthy diet and  maintain a healthy weight. A healthy diet includes low-fat dairy products, low-fat (lean) meats, and fiber from whole grains, beans, and lots of fruits and vegetables. Home safety  Remove any tripping hazards, such as rugs, cords, and clutter.  Install safety equipment such as grab bars in bathrooms and safety rails on stairs.  Keep rooms and walkways well-lit. Activity  Follow a regular exercise program to stay fit. This will help you maintain your balance. Ask your health care provider what types  of exercise are appropriate for you.  If you need a cane or walker, use it as recommended by your health care provider.  Wear supportive shoes that have nonskid soles.   Lifestyle  Do not drink alcohol if your health care provider tells you not to drink.  If you drink alcohol, limit how much you have: ? 0-1 drink a day for women. ? 0-2 drinks a day for men.  Be aware of how much alcohol is in your drink. In the [place] one drink equals one typical bottle of beer (12 oz), one-half glass of wine (5 oz), or one shot of hard liquor (1 oz).  Do not use any products that contain nicotine or tobacco, such as cigarettes and e-cigarettes. If you need help quitting, ask your health care provider. Summary  Having a healthy lifestyle and getting preventive care can help to protect your health and wellness after age 69.  Screening and testing are the best way to find a health problem early and help you avoid having a fall. Early diagnosis and treatment give you the best chance for managing medical conditions that are more common for people who are older than age 58.  Falls are a major cause of broken bones and head injuries in people who are older than age 7. Take precautions to prevent a fall at home.  Work with your health care provider to learn what changes you can make to improve your health and wellness and to prevent falls. This information is not intended to replace advice given to you by your health care provider. Make sure you discuss any questions you have with your health care provider. Document Revised: 10/05/2018 Document Reviewed: 04/27/2017 Elsevier Patient Education  2021 [employer].

## 2020-10-10 NOTE — Progress Notes (Addendum)
[place] [facility] [address], [place], [place] [address] [phone]  Date:  10/13/2020   Name:  Joshua [name]   DOB:  01-24-1951   MRN:  [id number]  PCP:  Darreld Mclean, MD    Chief Complaint: Annual Exam (Lab work, psa-sees urology)   History of Present Illness:  Joshua [name] is a 70 y.o. very pleasant male patient who presents with the following:  Here today for a CPE Last seen by myself in November - History of hypertension, mild asthma, BPH, dyslipidemia, cataracts He notes he was sick in January of this year with covid 19- he had mild sx and has now recovered  He is seen by Dr Melvyn Novas with pulmonary   covid booster/ 4th dose - encouraged him to do this shingrix- discussed with pt, encouraged him to do this vaccine series as well Colon 2019  lipitor Losartan symbicort  He continues to note right sided lower back pain for about 2 years now-currently not problematic He was doing PT last year for this issue  He now also has left hip pain  He is using voltaren gel  It bothers him most at night  He does not wish to have an oral NSAID at this time as it bothers his stomach  Patient Active Problem List   Diagnosis Date Noted  . Rhinitis 06/09/2020  . Healthcare maintenance 06/09/2020  . Vitreous floater, bilateral 05/15/2019  . Nuclear sclerotic cataract of both eyes 05/15/2019  . Precordial chest pain 04/06/2018  . Dyspnea on exertion 04/06/2018  . Dyslipidemia 04/06/2018  . BPH with urinary obstruction 03/31/2018  . Essential hypertension 01/17/2018  . Solitary lung nodule 09/07/2017  . Sinusitis, chronic/Cough 07/22/2016  . Chronic asthma, mild persistent, uncomplicated 35/36/1443  . Upper airway cough syndrome 06/29/2016    Past Medical History:  Diagnosis Date  . Asthma   . BPH (benign prostatic hyperplasia)   . GERD (gastroesophageal reflux disease)   . HLD (hyperlipidemia)   . HSV infection    . Hypertension   . Kidney stones     Past Surgical History:  Procedure Laterality Date  . APPENDECTOMY    . BREAST CYST EXCISION Left    benign  . LITHOTRIPSY    . TONSILLECTOMY      Social History   Tobacco Use  . Smoking status: Former Smoker    Packs/day: 0.50    Years: 22.00    Pack years: 11.00    Types: Cigarettes    Quit date: 06/28/1993    Years since quitting: 27.3  . Smokeless tobacco: Never Used  Vaping Use  . Vaping Use: Never used  Substance Use Topics  . Alcohol use: Yes    Comment: occasionally  . Drug use: No    Family History  Problem Relation Age of Onset  . Alzheimer's disease Father   . Kidney Stones Mother   . Colon cancer Paternal Grandfather 60    No Known Allergies  Medication list has been reviewed and updated.  Current Outpatient Medications on File Prior to Visit  Medication Sig Dispense Refill  . acyclovir ointment (ZOVIRAX) 5 % Apply 1 application topically every 3 (three) hours. Use for one week as needed for outbreak 30 g 3  . albuterol (VENTOLIN HFA) 108 (90 Base) MCG/ACT inhaler Inhale 1-2 puffs into the lungs every 6 (six) hours as needed for wheezing or shortness of breath. 8.5 g 5  . alfuzosin (  UROXATRAL) 10 MG 24 hr tablet Take 1 tablet (10 mg total) by mouth daily. 90 tablet 3  . atorvastatin (LIPITOR) 10 MG tablet TAKE 1 TABLET BY MOUTH DAILY (NEED OFFICE VISIT FOR REFILLS) 90 tablet 3  . budesonide-formoterol (SYMBICORT) 80-4.5 MCG/ACT inhaler INHALE 2 PUFFS BY MOUTH FIRST THING IN THE MORNING AND THEN 2 PUFFS ABOUT 12 HOURS LATER 30.6 g 5  . diclofenac Sodium (VOLTAREN) 1 % GEL Apply 2 g topically 4 (four) times daily. Use as needed for pain 100 g 3  . losartan (COZAAR) 100 MG tablet Take 1 tablet (100 mg total) by mouth daily. 90 tablet 1  . pantoprazole (PROTONIX) 40 MG tablet TAKE 1 TABLET BY MOUTH TWICE DAILY 30- 60 MINUTES BEFORE FIRST AND LAST MEAL OF THE DAY 90 tablet 1  . tadalafil (CIALIS) 5 MG tablet Take 5 mg by  mouth daily.    . valACYclovir (VALTREX) 500 MG tablet TAKE 1 TABLET BY MOUTH EVERY DAY 90 tablet 1   No current facility-administered medications on file prior to visit.    Review of Systems:  As per HPI- otherwise negative.   Physical Examination: Vitals:   10/13/20 1401  BP: 132/80  Pulse: 74  Resp: 16  Temp: (!) 97 F (36.1 C)  SpO2: 92%   Vitals:   10/13/20 1401  Weight: 202 lb (91.6 kg)  Height: 5\' 6"  (1.676 m)   Body mass index is 32.6 kg/m. Ideal Body Weight: Weight in (lb) to have BMI = 25: 154.6  GEN: no acute distress.  Overweight, looks well HEENT: Atraumatic, Normocephalic. Bilateral TM wnl, oropharynx normal.  PEERL,EOMI.   Ears and Nose: No external deformity. CV: RRR, No M/G/R. No JVD. No thrill. No extra heart sounds. PULM: CTA B, no wheezes, crackles, rhonchi. No retractions. No resp. distress. No accessory muscle use. ABD: S, NT, ND, +BS. No rebound. No HSM. EXTR: No c/c/e PSYCH: Normally interactive. Conversant.  At this point he denies low back pain I am not able to reproduce his left hip pain to suggest trochanteric bursitis.  Normal range of motion of the hip  Assessment and Plan: Essential hypertension - Plan: CBC, Comprehensive metabolic panel, losartan (COZAAR) 100 MG tablet  Screening for deficiency anemia - Plan: CBC  Screening for diabetes mellitus - Plan: Hemoglobin A1c  Dyslipidemia - Plan: Lipid panel, atorvastatin (LIPITOR) 10 MG tablet  Screening for prostate cancer - Plan: PSA, Medicare ( [place] [facility] only)  Elevated glucose - Plan: Hemoglobin A1c  Left hip pain Here today for physical exam-Medicare Will plan further follow- up pending labs. Discussed appropriate immunizations Discussed hip pain.  For the time being he is not wish to have x-rays.  He is using Voltaren gel and Tylenol and doing okay.  I asked him to let me know if this becomes more problematic  This visit occurred during the SARS-CoV-2 public health  emergency.  Safety protocols were in place, including screening questions prior to the visit, additional usage of staff PPE, and extensive cleaning of exam room while observing appropriate contact time as indicated for disinfecting solutions.    Signed Lamar Blinks, MD  Received labs as below 4/19- message to pt Results for orders placed or performed in visit on 10/13/20  CBC  Result Value Ref Range   WBC 6.8 4.0 - 10.5 K/uL   RBC 5.39 4.22 - 5.81 Mil/uL   Platelets 254.0 150.0 - 400.0 K/uL   Hemoglobin 15.0 13.0 - 17.0 g/dL   HCT 44.6  39.0 - 52.0 %   MCV 82.8 78.0 - 100.0 fl   MCHC 33.5 30.0 - 36.0 g/dL   RDW 13.5 11.5 - 15.5 %  Comprehensive metabolic panel  Result Value Ref Range   Sodium 139 135 - 145 mEq/L   Potassium 4.3 3.5 - 5.1 mEq/L   Chloride 105 96 - 112 mEq/L   CO2 29 19 - 32 mEq/L   Glucose, Bld 84 70 - 99 mg/dL   BUN 14 6 - 23 mg/dL   Creatinine, Ser 1.03 0.40 - 1.50 mg/dL   Total Bilirubin 0.5 0.2 - 1.2 mg/dL   Alkaline Phosphatase 71 39 - 117 U/L   AST 17 0 - 37 U/L   ALT 20 0 - 53 U/L   Total Protein 6.7 6.0 - 8.3 g/dL   Albumin 4.1 3.5 - 5.2 g/dL   GFR 74.47 >60.00 mL/min   Calcium 9.2 8.4 - 10.5 mg/dL  Hemoglobin A1c  Result Value Ref Range   Hgb A1c MFr Bld 5.5 4.6 - 6.5 %  Lipid panel  Result Value Ref Range   Cholesterol 163 0 - 200 mg/dL   Triglycerides 107.0 0.0 - 149.0 mg/dL   HDL 61.10 >39.00 mg/dL   VLDL 21.4 0.0 - 40.0 mg/dL   LDL Cholesterol 81 0 - 99 mg/dL   Total CHOL/HDL Ratio 3    NonHDL 102.30   PSA, Medicare ( [place] [facility] only)  Result Value Ref Range   PSA 1.04 0.10 - 4.00 ng/ml

## 2020-10-13 ENCOUNTER — Ambulatory Visit (INDEPENDENT_AMBULATORY_CARE_PROVIDER_SITE_OTHER): Payer: Medicare Other | Admitting: Family Medicine

## 2020-10-13 ENCOUNTER — Other Ambulatory Visit: Payer: Self-pay

## 2020-10-13 ENCOUNTER — Encounter: Payer: Self-pay | Admitting: Family Medicine

## 2020-10-13 VITALS — BP 132/80 | HR 74 | Temp 97.0°F | Resp 16 | Ht 66.0 in | Wt 202.0 lb

## 2020-10-13 DIAGNOSIS — Z125 Encounter for screening for malignant neoplasm of prostate: Secondary | ICD-10-CM | POA: Diagnosis not present

## 2020-10-13 DIAGNOSIS — Z131 Encounter for screening for diabetes mellitus: Secondary | ICD-10-CM | POA: Diagnosis not present

## 2020-10-13 DIAGNOSIS — E785 Hyperlipidemia, unspecified: Secondary | ICD-10-CM | POA: Diagnosis not present

## 2020-10-13 DIAGNOSIS — I10 Essential (primary) hypertension: Secondary | ICD-10-CM | POA: Diagnosis not present

## 2020-10-13 DIAGNOSIS — M25552 Pain in left hip: Secondary | ICD-10-CM

## 2020-10-13 DIAGNOSIS — R7309 Other abnormal glucose: Secondary | ICD-10-CM | POA: Diagnosis not present

## 2020-10-13 DIAGNOSIS — Z130 Encounter for screening for diseases of the blood and blood-forming organs and certain disorders involving the immune mechanism: Secondary | ICD-10-CM | POA: Diagnosis not present

## 2020-10-13 MED ORDER — LOSARTAN POTASSIUM 100 MG PO TABS: 100.0000 mg | ORAL_TABLET | Freq: Every day | ORAL | 3 refills | Status: DC

## 2020-10-13 MED ORDER — ATORVASTATIN CALCIUM 10 MG PO TABS: ORAL_TABLET | ORAL | 3 refills | Status: DC

## 2020-10-14 ENCOUNTER — Encounter: Payer: Self-pay | Admitting: Family Medicine

## 2020-10-14 LAB — LIPID PANEL
Cholesterol: 163 mg/dL (ref 0–200)
HDL: 61.1 mg/dL (ref >39.00)
LDL Cholesterol: 81 mg/dL (ref 0–99)
NonHDL: 102.3
Total CHOL/HDL Ratio: 3
Triglycerides: 107 mg/dL (ref 0.0–149.0)
VLDL: 21.4 mg/dL (ref 0.0–40.0)

## 2020-10-14 LAB — HEMOGLOBIN A1C: Hgb A1c MFr Bld: 5.5 % (ref 4.6–6.5)

## 2020-10-14 LAB — COMPREHENSIVE METABOLIC PANEL
ALT: 20 U/L (ref 0–53)
AST: 17 U/L (ref 0–37)
Albumin: 4.1 g/dL (ref 3.5–5.2)
Alkaline Phosphatase: 71 U/L (ref 39–117)
BUN: 14 mg/dL (ref 6–23)
CO2: 29 mEq/L (ref 19–32)
Calcium: 9.2 mg/dL (ref 8.4–10.5)
Chloride: 105 mEq/L (ref 96–112)
Creatinine, Ser: 1.03 mg/dL (ref 0.40–1.50)
GFR: 74.47 mL/min (ref >60.00)
Glucose, Bld: 84 mg/dL (ref 70–99)
Potassium: 4.3 mEq/L (ref 3.5–5.1)
Sodium: 139 mEq/L (ref 135–145)
Total Bilirubin: 0.5 mg/dL (ref 0.2–1.2)
Total Protein: 6.7 g/dL (ref 6.0–8.3)

## 2020-10-14 LAB — CBC
HCT: 44.6 % (ref 39.0–52.0)
Hemoglobin: 15 g/dL (ref 13.0–17.0)
MCHC: 33.5 g/dL (ref 30.0–36.0)
MCV: 82.8 fl (ref 78.0–100.0)
Platelets: 254 10*3/uL (ref 150.0–400.0)
RBC: 5.39 Mil/uL (ref 4.22–5.81)
RDW: 13.5 % (ref 11.5–15.5)
WBC: 6.8 10*3/uL (ref 4.0–10.5)

## 2020-10-14 LAB — PSA, MEDICARE: PSA: 1.04 ng/ml (ref 0.10–4.00)

## 2020-10-21 DIAGNOSIS — Z87442 Personal history of urinary calculi: Secondary | ICD-10-CM | POA: Diagnosis not present

## 2020-10-21 DIAGNOSIS — R350 Frequency of micturition: Secondary | ICD-10-CM | POA: Diagnosis not present

## 2020-10-21 DIAGNOSIS — N401 Enlarged prostate with lower urinary tract symptoms: Secondary | ICD-10-CM | POA: Diagnosis not present

## 2020-10-21 DIAGNOSIS — N138 Other obstructive and reflux uropathy: Secondary | ICD-10-CM | POA: Diagnosis not present

## 2021-01-01 DIAGNOSIS — N138 Other obstructive and reflux uropathy: Secondary | ICD-10-CM | POA: Diagnosis not present

## 2021-01-01 DIAGNOSIS — N401 Enlarged prostate with lower urinary tract symptoms: Secondary | ICD-10-CM | POA: Diagnosis not present

## 2021-01-01 DIAGNOSIS — Z87442 Personal history of urinary calculi: Secondary | ICD-10-CM | POA: Diagnosis not present

## 2021-02-25 ENCOUNTER — Telehealth: Payer: Self-pay | Admitting: Internal Medicine

## 2021-02-25 ENCOUNTER — Other Ambulatory Visit: Payer: Self-pay | Admitting: Internal Medicine

## 2021-02-25 NOTE — Telephone Encounter (Signed)
Lm for patient.  

## 2021-02-26 ENCOUNTER — Ambulatory Visit (INDEPENDENT_AMBULATORY_CARE_PROVIDER_SITE_OTHER): Payer: Medicare Other | Admitting: Podiatry

## 2021-02-26 ENCOUNTER — Encounter: Payer: Self-pay | Admitting: Podiatry

## 2021-02-26 ENCOUNTER — Other Ambulatory Visit: Payer: Self-pay

## 2021-02-26 ENCOUNTER — Ambulatory Visit (INDEPENDENT_AMBULATORY_CARE_PROVIDER_SITE_OTHER): Payer: Medicare Other

## 2021-02-26 DIAGNOSIS — M7752 Other enthesopathy of left foot: Secondary | ICD-10-CM

## 2021-02-26 DIAGNOSIS — M779 Enthesopathy, unspecified: Secondary | ICD-10-CM | POA: Diagnosis not present

## 2021-02-26 DIAGNOSIS — M79672 Pain in left foot: Secondary | ICD-10-CM

## 2021-02-26 MED ORDER — TRIAMCINOLONE ACETONIDE 10 MG/ML IJ SUSP
10.0000 mg | Freq: Once | INTRAMUSCULAR | Status: AC
  Administered 2021-02-26: 10 mg

## 2021-02-26 NOTE — Progress Notes (Signed)
Subjective:   Patient ID: Joshua [name], male   DOB: 70 y.o.   MRN: [id number]   HPI Patient presents stating he has developed pain in the bottom of his left big toe he does not remember specific injury [place] and may have stepped on something also concerned about some numbness of the second and third toes   ROS      Objective:  Physical Exam  Neurovascular status intact with small nodule plantar aspect left hallux freely movable with no indications of foreign body or penetration with no pathology today noted the second and third digits     Assessment:  Probability for a small cyst or inflammatory plantar capsule of the hallux left along with possible low-grade neuropathy     Plan:  H&P reviewed condition and went ahead today did sterile prep and injected the plantar capsule of the inner phalangeal joint left big toe 3 mg Dexasone Kenalog 5 mg Xylocaine and discussed if it were to continue to be a problem may require excision or other exploratory surgery.  Do not recommend other treatment  X-rays were negative for signs of bony calcification or any calcification pathology

## 2021-02-27 ENCOUNTER — Other Ambulatory Visit: Payer: Self-pay | Admitting: Family Medicine

## 2021-02-27 ENCOUNTER — Other Ambulatory Visit: Payer: Self-pay | Admitting: Podiatry

## 2021-02-27 DIAGNOSIS — A609 Anogenital herpesviral infection, unspecified: Secondary | ICD-10-CM

## 2021-02-27 DIAGNOSIS — M779 Enthesopathy, unspecified: Secondary | ICD-10-CM

## 2021-02-27 NOTE — Telephone Encounter (Signed)
Left detailed msg on machine letting him know we need to verify pharm and he needs f/u with MW set up  Closing per protocol

## 2021-04-08 ENCOUNTER — Other Ambulatory Visit: Payer: Self-pay | Admitting: Internal Medicine

## 2021-04-16 ENCOUNTER — Encounter: Payer: Self-pay | Admitting: Internal Medicine

## 2021-04-16 ENCOUNTER — Ambulatory Visit (INDEPENDENT_AMBULATORY_CARE_PROVIDER_SITE_OTHER): Payer: Medicare Other | Admitting: Internal Medicine

## 2021-04-16 ENCOUNTER — Other Ambulatory Visit: Payer: Self-pay

## 2021-04-16 DIAGNOSIS — R058 Other specified cough: Secondary | ICD-10-CM

## 2021-04-16 DIAGNOSIS — J4530 Mild persistent asthma, uncomplicated: Secondary | ICD-10-CM | POA: Diagnosis not present

## 2021-04-16 MED ORDER — ALBUTEROL SULFATE HFA 108 (90 BASE) MCG/ACT IN AERS: 1.0000 | INHALATION_SPRAY | Freq: Four times a day (QID) | RESPIRATORY_TRACT | 2 refills | Status: DC | PRN

## 2021-04-16 MED ORDER — AZITHROMYCIN 250 MG PO TABS: ORAL_TABLET | ORAL | 0 refills | Status: DC

## 2021-04-16 MED ORDER — BUDESONIDE-FORMOTEROL FUMARATE 80-4.5 MCG/ACT IN AERO: INHALATION_SPRAY | RESPIRATORY_TRACT | 11 refills | Status: DC

## 2021-04-16 MED ORDER — PREDNISONE 10 MG PO TABS: ORAL_TABLET | ORAL | 0 refills | Status: DC

## 2021-04-16 NOTE — Progress Notes (Signed)
Subjective:    Patient ID: Joshua [name], male   DOB: [birth year]    MRN: [id number]    Brief patient profile:  70  yo [place] male  with bad bronchitis around 1993 and quit smoking and then  all better until around 2000 while living in [place] with intermittent "wheezing" dx as asthma > never resolved  Self referred for dtc asthma  Allergy testing in [place] around age 6 = neg per pt    History of Present Illness  06/29/2016 1st [facility] office visit/ Joshua [name]   Chief Complaint  Patient presents with   Pulmonary Consult    moved from [place], chronic bronchitis, wheezing, dx with asthma in 2000, cold humidity makes it worse  maint on symb 160 2bid and at baseline intermittently noisy breathing one breath q 2-3 days  s pattern though not waking him  noct   Worse since  Jun 22 2016  with uri/ sinus symptoms >  [facility] rx  Amox/only a little better, still severe nasal congestion/severe fits of day > noct cough and subj wheeze.  Typical triggers are uri/not seasonal pattern rhintiis - happens up to 4 x yearly with or without symbicort and just as severe. rec Augmentin 875 mg take one pill twice daily  X 10 days   Please see patient coordinator before you leave today  to schedule CT sinus  in 10 days - no sooner Use nasal saline as much as possible to help moisturize your passages  Continue protonix but take it Take 30-60 min before first meal of the day  Prednisone 10 mg take  4 each am x 2 days,   2 each am x 2 days,  1 each am x 2 days and stop  Work on inhaler technique:  Plan A = Automatic = symbicort 80 Take 2 puffs first thing in am and then another 2 puffs about 12 hours later.  Plan B = Backup Only use your albuterol (Proir) as a rescue medication to be used if you can't catch your breath by resting or doing a relaxed purse lip breathing pattern.  - The less you use it, the better it will work when you need it. - Ok to use the inhaler up to 2 puffs  every 4 hours     07/21/2016  f/u  ov/Joshua [name] re: uacs vs asthma Chief Complaint  Patient presents with   Follow-up    Cough is much improved, but has not completely resolved. Breathing has improved.   cough is worse at bedtime and in am p rising but not waking up prematurely and not productive  Bloody nasal d/c / no sob at all  rec Use lots of saline nasal spray on your nose Please remember to go to the x-ray department downstairs for your tests - we will call you with the results when they are available. Ty off the symbicort to see if any worse and if so restart  At 2 pffs every 12 hour schedule ENT  >   [name] rec abx and f/u prn      09/05/2017  f/u ov/Joshua [name] re: chronic asthma/? vcd component  Chief Complaint  Patient presents with   Follow-up    Has noticed wheezing and chest tightness for the past few wks. He has been taking his symbicort in the am only. He uses his albuterol inhaler no more than once per wk.   Dyspnea:  Better when uses symbicort  Cough: none Sleep: rare chest tightness once  a month x 20 min  / better p saba  / No assoc overt hb SABA use: not using when needing symb  rec symbicort increase to 160 Take 2 puffs first thing in am and then another 2 puffs about 12 hours later Work on inhaler technique:     10/03/2017  f/u ov/Joshua [name] re: chronic asthma  Chief Complaint  Patient presents with   Follow-up    SOB with exertion or long conversations, no wheezing has random gasps for air   Dyspnea:  Not limited by breathing from desired activities   Cough: no   Sleep: disturbed by back pain  SABA use:  Up to twice daily ? Consistent with symbicort -thinks the 6 works better than the 160  rec Change back to symbicort 80 Take 2 puffs first thing in am and then another 2 puffs about 12 hours later.  Only use your albuterol as a rescue medication   - late add consider singulair add next     07/18/2018 acute extended ov/Joshua [name] re:  Cough flare on maint rx with syb 80 and ppi q d at [place]  Patient presents with   Acute Visit    cough with greenish mucus, SOB with exertion,    maint on symb80 2bid/ protonix 40 mg not ac and not needing overall doing well  but  never really cleared the afteroon dry cough  then much worse w/in a week of arriving back p  international travel  2 prior to OV  much worse cough assoc with nasal congestion/ slt bloody dc/ on flonase and sense of doe not better with saba but comfortable at rest sitting if not coughing Just started on augmentin  07/14/18 for uti by pcp no better yet  rec Add singulair 10 mg one each pm  When coughing at all > protonix Take 30- 60 min before your first and last meals of the day  Finish Augmentin 875 mg take one pill twice daily  GERD   Prednisone 10 mg take  4 each am x 2 days,   2 each am x 2 days,  1 each am x 2 days and stop  Take delsym two tsp every 12 hours and supplement if needed with  Tylenol #3 up to 1 every 4 hours to suppress the urge to cough. Swallowing water and/or using ice chips/non mint and menthol containing candies (such as lifesavers or sugarless jolly ranchers) are also effective.  You should rest your voice and avoid activities that you know make you cough. Once you have eliminated the cough for 3 straight days try reducing the tylenol #3 first,  then the delsym as tolerated.     09/01/2018  f/u ov/Joshua [name] re: asthma with uacs component on symb 80 2bid  Chief Complaint  Patient presents with   Follow-up    Coughing less but still coughs if he talks alot. He is using his albuterol inhaler 3-4 x per wk.   Dyspnea:  MMRC1 = can walk nl pace, flat grade, can't hurry or go uphills or steps s sob   Cough: with voice use  Sleeping: no cough/ wheeze SABA use: as above / seems to helped rec Try symbicort 80 one twice daily to see if throat is better or breathing is worse If not better, call me for ent referral     01/23/2019  f/u ov/Joshua [name] re: asthma with ? uacs better, on symb 80 2 bid  Chief  Complaint  Patient  presents with   Follow-up    Breathing is overall doing well. He has used his rescue inhaler once in the past wk.   Dyspnea:  MMRC1 = can walk nl pace, flat grade, can't hurry or go uphills or steps s sob   Cough: no  Sleeping: no respiratory symptoms  SABA use: rare 02: none  rec Stop singulair but no other changes for now   04/16/2021  f/u ov/Joshua [name] re: recurrent cough uacs vs asthma    maint on symb 80  Chief Complaint  Patient presents with   Follow-up    Productive cough   Dyspnea:  MMRC1 = can walk nl pace, flat grade, can't hurry or go uphills or steps s sob   Cough: started up 10 d prior to OV  with jet travel ended 3 days/ did not wear / covid 19   day > noct,  light green robittussin not helping  SABA use: bid now, not usually  02: none  Covid status:   vax x 2    No obvious day to day or daytime variability or assoc excess/ purulent sputum or mucus plugs or hemoptysis or cp or chest tightness, subjective wheeze or overt sinus or hb symptoms.    Also denies any obvious fluctuation of symptoms with weather or environmental changes or other aggravating or alleviating factors except as outlined above   No unusual exposure hx or h/o childhood pna/ asthma or knowledge of premature birth.  Current Allergies, Complete Past Medical History, Past Surgical History, Family History, and Social History were reviewed in [employer] record.  ROS  The following are not active complaints unless bolded Hoarseness, sore throat, dysphagia, dental problems, itching, sneezing,  nasal congestion or discharge of excess mucus or purulent secretions, ear ache,   fever, chills, sweats, unintended wt loss or wt gain, classically pleuritic or exertional cp,  orthopnea pnd or arm/hand swelling  or leg swelling, presyncope, palpitations, abdominal pain, anorexia, nausea, vomiting, diarrhea  or change in bowel habits or change in bladder habits, change in stools  or change in urine, dysuria, hematuria,  rash, arthralgias, visual complaints, headache, numbness, weakness or ataxia or problems with walking or coordination,  change in mood or  memory.        Current Meds  Medication Sig   acyclovir ointment (ZOVIRAX) 5 % Apply 1 application topically every 3 (three) hours. Use for one week as needed for outbreak   albuterol (VENTOLIN HFA) 108 (90 Base) MCG/ACT inhaler Inhale 1-2 puffs into the lungs every 6 (six) hours as needed for wheezing or shortness of breath.   alfuzosin (UROXATRAL) 10 MG 24 hr tablet Take 1 tablet (10 mg total) by mouth daily.   atorvastatin (LIPITOR) 10 MG tablet TAKE 1 TABLET BY MOUTH DAILY   budesonide-formoterol (SYMBICORT) 80-4.5 MCG/ACT inhaler INHALE 2 PUFFS BY MOUTH FIRST THING IN THE MORNING THEN INHALE 2 PUFFS BY MOUTH 12 HOURS LATER   diclofenac Sodium (VOLTAREN) 1 % GEL Apply 2 g topically 4 (four) times daily. Use as needed for pain   losartan (COZAAR) 100 MG tablet Take 1 tablet (100 mg total) by mouth daily.   pantoprazole (PROTONIX) 40 MG tablet TAKE 1 TABLET BY MOUTH TWICE DAILY 30- 60 MINUTES BEFORE FIRST AND LAST MEAL OF THE DAY   tadalafil (CIALIS) 5 MG tablet Take 5 mg by mouth daily.   valACYclovir (VALTREX) 500 MG tablet TAKE 1 TABLET BY MOUTH EVERY DAY  Objective:   Physical Exam     04/16/2021     195  07/28/2020      203 07/30/2019         203  01/23/2019       201  09/01/2018         205  07/18/2018       200     02/28/2018          203   10/03/2017         204   09/05/2017       204 07/21/16 196 lb (88.9 kg)  06/29/16 197 lb 12.8 oz (89.7 kg)     Vital signs reviewed  04/16/2021  - Note at rest 02 sats  99% on RA   General appearance:    somber amb wm nad   HEENT : pt wearing mask not removed for exam due to covid - 19 concerns.   NECK :  without JVD/Nodes/TM/ nl carotid upstrokes bilaterally   LUNGS: no acc muscle use,  Min barrel  contour chest wall with bilateral   slightly decreased bs s audible wheeze and  without cough on insp or exp maneuvers and min  Hyperresonant  to  percussion bilaterally     CV:  RRR  no s3 or murmur or increase in P2, and no edema   ABD:  soft and nontender with pos end  insp Hoover's  in the supine position. No bruits or organomegaly appreciated, bowel sounds nl  MS:   Nl gait/  ext warm without deformities, calf tenderness, cyanosis or clubbing No obvious joint restrictions   SKIN: warm and dry without lesions    NEURO:  alert, approp, nl sensorium with  no motor or cerebellar deficits apparent.              Assessment:

## 2021-04-16 NOTE — Patient Instructions (Addendum)
Mucinex dm (1200 mg twice daily )  or delsym but not both as need for cough/ congestion  Zpak   Prednisone 10 mg take  4 each am x 2 days,   2 each am x 2 days,  1 each am x 2 days and stop    Please schedule a follow up visit in 6 months but call sooner if needed

## 2021-04-17 ENCOUNTER — Encounter: Payer: Self-pay | Admitting: Internal Medicine

## 2021-04-17 ENCOUNTER — Ambulatory Visit: Payer: Medicare Other | Admitting: Internal Medicine

## 2021-04-17 NOTE — Assessment & Plan Note (Signed)
Onset around 2000 in [place] with allergy testing neg there 06/29/2016    reduce symbicort to 80 2 bid  - FENO 07/21/2016  =   24 on symb 80 2bid - Spirometry 07/21/2016  wnl including  Mid flows p am symbicort > try off > flared so restarted  - 09/05/2017    try symb increase to 160  - PFT's  10/03/2017  FEV1 2.57 (88 % ) ratio 67  p 21 % improvement from saba p symb 160 prior to study with DLCO  108 % corrects to 113 % for alv volume  With variable truncation of insp portion of f/v loop and reported better benefit from 80 than 160 strength c/w uacs / vcd component  - 05/31/2018 flare on symb 80 2bid  - 07/18/2018 added singulair trial > d/c 01/23/2019  - 07/18/18 Spirometry   FEV1 2.6 (88%)  Ratio 0.80  - 09/01/2018  After extensive coaching inhaler device,  effectiveness =    75% (short Ti) rec try symb 80 just one bid to see if helps hoarseness or flares asthma > consider adding spacer next  - try off singulair 01/23/2019  - 07/30/2019  After extensive coaching inhaler device,  effectiveness =    90%  - flared 07/27/19 due to covid in vaccinated but not boosted state > 07/01/2020  rec pred x 6 and tyl #3   Mild flare in pt with chronically good contorl p jet travel typical of AB exac rx zpak and Prednisone 10 mg take  4 each am x 2 days,   2 each am x 2 days,  1 each am x 2 days and stop with goal to eliminate prn saba or other option: consider double the strength of symbicort during flares

## 2021-04-17 NOTE — Assessment & Plan Note (Signed)
Onset 05/2016  - sinus CT p 10 day augmentin  06/29/2016 >>>  CT  Sinus 07/09/2016 mild chronic changes only  - cough better 07/21/2016 > try just on gerd rx/ no more symbicort > flared off symbicort  eval by [place] ent 07/26/16  Nordbladh, PA > rec levaquin  500 x  10 days and f/u prn  - severe flare 07/18/2018 p uri/? Sinusitis > augmentin /cyclical cough rx> 11/30/352 improved x for hoarseness and cough with voice use  - severe flare 06/25/20 in setting of covid 19 infection  > cyclical cough rx 65/68/12 > improved 07/28/2020   rec mucinex dm/ avoid narcotics and continue ppi bid ac / gerd diet   F/u in 2 week if not better, o/w  q 6 m          Each maintenance medication was reviewed in detail including emphasizing most importantly the difference between maintenance and prns and under what circumstances the prns are to be triggered using an action plan format where appropriate.  Total time for H and P, chart review, counseling, reviewing hfa  device(s) and generating customized AVS unique to this office visit / same day charting = 25 min

## 2021-04-23 ENCOUNTER — Ambulatory Visit (INDEPENDENT_AMBULATORY_CARE_PROVIDER_SITE_OTHER): Payer: Medicare Other

## 2021-04-23 VITALS — Ht 66.0 in | Wt 195.0 lb

## 2021-04-23 DIAGNOSIS — Z0000 Encounter for general adult medical examination without abnormal findings: Secondary | ICD-10-CM

## 2021-04-23 NOTE — Patient Instructions (Signed)
Joshua [name] , Thank you for taking time to complete your Medicare Wellness Visit. I appreciate your ongoing commitment to your health goals. Please review the following plan we discussed and let me know if I can assist you in the future.   Screening recommendations/referrals: Colonoscopy: Completed 11/16/2017-Due 11/24/2022 Recommended yearly ophthalmology/optometry visit for glaucoma screening and checkup Recommended yearly dental visit for hygiene and checkup  Vaccinations: Influenza vaccine: Due-May obtain vaccine at our office or your local pharmacy. Pneumococcal vaccine: Up to date Tdap vaccine: Up to date-Due-01/19/2028 Shingles vaccine: May obtain vaccine at your local pharmacy. Covid-19: Booster available at the pharmacy  Advanced directives: Declined information today  Conditions/risks identified: See problem list  Next appointment: Follow up in one year for your annual wellness visit.   Preventive Care 24 Years and Older, Male Preventive care refers to lifestyle choices and visits with your health care provider that can promote health and wellness. What does preventive care include? A yearly physical exam. This is also called an annual well check. Dental exams once or twice a year. Routine eye exams. Ask your health care provider how often you should have your eyes checked. Personal lifestyle choices, including: Daily care of your teeth and gums. Regular physical activity. Eating a healthy diet. Avoiding tobacco and drug use. Limiting alcohol use. Practicing safe sex. Taking low doses of aspirin every day. Taking vitamin and mineral supplements as recommended by your health care provider. What happens during an annual well check? The services and screenings done by your health care provider during your annual well check will depend on your age, overall health, lifestyle risk factors, and family history of disease. Counseling  Your health care provider may ask you  questions about your: Alcohol use. Tobacco use. Drug use. Emotional well-being. Home and relationship well-being. Sexual activity. Eating habits. History of falls. Memory and ability to understand (cognition). Work and work Statistician. Screening  You may have the following tests or measurements: Height, weight, and BMI. Blood pressure. Lipid and cholesterol levels. These may be checked every 5 years, or more frequently if you are over 62 years old. Skin check. Lung cancer screening. You may have this screening every year starting at age 7 if you have a 30-pack-year history of smoking and currently smoke or have quit within the past 15 years. Fecal occult blood test (FOBT) of the stool. You may have this test every year starting at age 79. Flexible sigmoidoscopy or colonoscopy. You may have a sigmoidoscopy every 5 years or a colonoscopy every 10 years starting at age 91. Prostate cancer screening. Recommendations will vary depending on your family history and other risks. Hepatitis C blood test. Hepatitis B blood test. Sexually transmitted disease (STD) testing. Diabetes screening. This is done by checking your blood sugar (glucose) after you have not eaten for a while (fasting). You may have this done every 1-3 years. Abdominal aortic aneurysm (AAA) screening. You may need this if you are a current or former smoker. Osteoporosis. You may be screened starting at age 64 if you are at high risk. Talk with your health care provider about your test results, treatment options, and if necessary, the need for more tests. Vaccines  Your health care provider may recommend certain vaccines, such as: Influenza vaccine. This is recommended every year. Tetanus, diphtheria, and acellular pertussis (Tdap, Td) vaccine. You may need a Td booster every 10 years. Zoster vaccine. You may need this after age 91. Pneumococcal 13-valent conjugate (PCV13) vaccine. One dose is recommended  after age  61. Pneumococcal polysaccharide (PPSV23) vaccine. One dose is recommended after age 62. Talk to your health care provider about which screenings and vaccines you need and how often you need them. This information is not intended to replace advice given to you by your health care provider. Make sure you discuss any questions you have with your health care provider. Document Released: 07/11/2015 Document Revised: 03/03/2016 Document Reviewed: 04/15/2015 Elsevier Interactive Patient Education  2017 [place] Prevention in the Home Falls can cause injuries. They can happen to people of all ages. There are many things you can do to make your home safe and to help prevent falls. What can I do on the outside of my home? Regularly fix the edges of walkways and driveways and fix any cracks. Remove anything that might make you trip as you walk through a door, such as a raised step or threshold. Trim any bushes or trees on the path to your home. Use bright outdoor lighting. Clear any walking paths of anything that might make someone trip, such as rocks or tools. Regularly check to see if handrails are loose or broken. Make sure that both sides of any steps have handrails. Any raised decks and porches should have guardrails on the edges. Have any leaves, snow, or ice cleared regularly. Use sand or salt on walking paths during winter. Clean up any spills in your garage right away. This includes oil or grease spills. What can I do in the bathroom? Use night lights. Install grab bars by the toilet and in the tub and shower. Do not use towel bars as grab bars. Use non-skid mats or decals in the tub or shower. If you need to sit down in the shower, use a plastic, non-slip stool. Keep the floor dry. Clean up any water that spills on the floor as soon as it happens. Remove soap buildup in the tub or shower regularly. Attach bath mats securely with double-sided non-slip rug tape. Do not have throw  rugs and other things on the floor that can make you trip. What can I do in the bedroom? Use night lights. Make sure that you have a light by your bed that is easy to reach. Do not use any sheets or blankets that are too big for your bed. They should not hang down onto the floor. Have a firm chair that has side arms. You can use this for support while you get dressed. Do not have throw rugs and other things on the floor that can make you trip. What can I do in the kitchen? Clean up any spills right away. Avoid walking on wet floors. Keep items that you use a lot in easy-to-reach places. If you need to reach something above you, use a strong step stool that has a grab bar. Keep electrical cords out of the way. Do not use floor polish or wax that makes floors slippery. If you must use wax, use non-skid floor wax. Do not have throw rugs and other things on the floor that can make you trip. What can I do with my stairs? Do not leave any items on the stairs. Make sure that there are handrails on both sides of the stairs and use them. Fix handrails that are broken or loose. Make sure that handrails are as long as the stairways. Check any carpeting to make sure that it is firmly attached to the stairs. Fix any carpet that is loose or worn. Avoid having throw  rugs at the top or bottom of the stairs. If you do have throw rugs, attach them to the floor with carpet tape. Make sure that you have a light switch at the top of the stairs and the bottom of the stairs. If you do not have them, ask someone to add them for you. What else can I do to help prevent falls? Wear shoes that: Do not have high heels. Have rubber bottoms. Are comfortable and fit you well. Are closed at the toe. Do not wear sandals. If you use a stepladder: Make sure that it is fully opened. Do not climb a closed stepladder. Make sure that both sides of the stepladder are locked into place. Ask someone to hold it for you, if  possible. Clearly mark and make sure that you can see: Any grab bars or handrails. First and last steps. Where the edge of each step is. Use tools that help you move around (mobility aids) if they are needed. These include: Canes. Walkers. Scooters. Crutches. Turn on the lights when you go into a dark area. Replace any light bulbs as soon as they burn out. Set up your furniture so you have a clear path. Avoid moving your furniture around. If any of your floors are uneven, fix them. If there are any pets around you, be aware of where they are. Review your medicines with your doctor. Some medicines can make you feel dizzy. This can increase your chance of falling. Ask your doctor what other things that you can do to help prevent falls. This information is not intended to replace advice given to you by your health care provider. Make sure you discuss any questions you have with your health care provider. Document Released: 04/10/2009 Document Revised: 11/20/2015 Document Reviewed: 07/19/2014 Elsevier Interactive Patient Education  2017 [employer].

## 2021-04-23 NOTE — Progress Notes (Addendum)
Subjective:   Joshua [name] is a 70 y.o. male who presents for an Initial Medicare Annual Wellness Visit.  I connected with Joshua [name] today by telephone and verified that I am speaking with the correct person using two identifiers. Location patient: home Location provider: work Persons participating in the virtual visit: patient, Marine scientist.    I discussed the limitations, risks, security and privacy concerns of performing an evaluation and management service by telephone and the availability of in person appointments. I also discussed with the patient that there may be a patient responsible charge related to this service. The patient expressed understanding and verbally consented to this telephonic visit.    Interactive audio and video telecommunications were attempted between this provider and patient, however failed, due to patient having technical difficulties OR patient did not have access to video capability.  We continued and completed visit with audio only.  Some vital signs may be absent or patient reported.   Time Spent with patient on telephone encounter: 20 minutes   Review of Systems     Cardiac Risk Factors include: advanced age (>80men, >65 women);male gender;obesity (BMI >30kg/m2);dyslipidemia;hypertension     Objective:    Today's Vitals   04/23/21 1340  Weight: 195 lb (88.5 kg)  Height: 5\' 6"  (1.676 m)   Body mass index is 31.47 kg/m.  Advanced Directives 04/23/2021 11/16/2017  Does Patient Have a Medical Advance Directive? No No  Would patient like information on creating a medical advance directive? No - Patient declined No - Patient declined    Current Medications (verified) Outpatient Encounter Medications as of 04/23/2021  Medication Sig   acyclovir ointment (ZOVIRAX) 5 % Apply 1 application topically every 3 (three) hours. Use for one week as needed for outbreak   albuterol (VENTOLIN HFA) 108 (90 Base) MCG/ACT inhaler Inhale 1-2 puffs into the lungs every 6  (six) hours as needed for wheezing or shortness of breath.   alfuzosin (UROXATRAL) 10 MG 24 hr tablet Take 1 tablet (10 mg total) by mouth daily.   atorvastatin (LIPITOR) 10 MG tablet TAKE 1 TABLET BY MOUTH DAILY   azithromycin (ZITHROMAX) 250 MG tablet Take 2 on day one then 1 daily x 4 days   budesonide-formoterol (SYMBICORT) 80-4.5 MCG/ACT inhaler INHALE 2 PUFFS BY MOUTH FIRST THING IN THE MORNING THEN INHALE 2 PUFFS BY MOUTH 12 HOURS LATER   diclofenac Sodium (VOLTAREN) 1 % GEL Apply 2 g topically 4 (four) times daily. Use as needed for pain   losartan (COZAAR) 100 MG tablet Take 1 tablet (100 mg total) by mouth daily.   pantoprazole (PROTONIX) 40 MG tablet TAKE 1 TABLET BY MOUTH TWICE DAILY 30- 60 MINUTES BEFORE FIRST AND LAST MEAL OF THE DAY   predniSONE (DELTASONE) 10 MG tablet Take  4 each am x 2 days,   2 each am x 2 days,  1 each am x 2 days and stop   tadalafil (CIALIS) 5 MG tablet Take 5 mg by mouth daily.   valACYclovir (VALTREX) 500 MG tablet TAKE 1 TABLET BY MOUTH EVERY DAY   No facility-administered encounter medications on file as of 04/23/2021.    Allergies (verified) Patient has no known allergies.   History: Past Medical History:  Diagnosis Date   Asthma    BPH (benign prostatic hyperplasia)    GERD (gastroesophageal reflux disease)    HLD (hyperlipidemia)    HSV infection    Hypertension    Kidney stones    Past Surgical History:  Procedure  Laterality Date   APPENDECTOMY     BREAST CYST EXCISION Left    benign   LITHOTRIPSY     TONSILLECTOMY     Family History  Problem Relation Age of Onset   Alzheimer's disease Father    Kidney Stones Mother    Colon cancer Paternal Grandfather 41   Social History   Socioeconomic History   Marital status: Married    Spouse name: Not on file   Number of children: 2   Years of education: Not on file   Highest education level: Not on file  Occupational History   Occupation: Engineer, maintenance (IT)  Tobacco Use   Smoking  status: Former    Packs/day: 0.50    Years: 22.00    Pack years: 11.00    Types: Cigarettes    Quit date: 06/28/1993    Years since quitting: 27.8   Smokeless tobacco: Never  Vaping Use   Vaping Use: Never used  Substance and Sexual Activity   Alcohol use: Yes    Comment: occasionally   Drug use: No   Sexual activity: Not on file  Other Topics Concern   Not on file  Social History Narrative   Not on file   Social Determinants of Health   Financial Resource Strain: Low Risk    Difficulty of Paying Living Expenses: Not hard at all  Food Insecurity: No Food Insecurity   Worried About Charity fundraiser in the Last Year: Never true   [place] in the Last Year: Never true  Transportation Needs: No Transportation Needs   Lack of Transportation (Medical): No   Lack of Transportation (Non-Medical): No  Physical Activity: Insufficiently Active   Days of Exercise per Week: 3 days   Minutes of Exercise per Session: 30 min  Stress: No Stress Concern Present   Feeling of Stress : Not at all  Social Connections: Not on file    Tobacco Counseling Counseling given: Not Answered   Clinical Intake:  Pre-visit preparation completed: Yes  Pain : No/denies pain     BMI - recorded: 31.47 Nutritional Status: BMI > 30  Obese Nutritional Risks: None Diabetes: No  How often do you need to have someone help you when you read instructions, pamphlets, or other written materials from your doctor or pharmacy?: 1 - Never  Diabetic?No  Interpreter Needed?: No  Information entered by :: Caroleen Hamman LPN   Activities of Daily Living In your present state of health, do you have any difficulty performing the following activities: 04/23/2021  Hearing? Y  Comment hearing aids  Vision? N  Difficulty concentrating or making decisions? N  Walking or climbing stairs? N  Dressing or bathing? N  Doing errands, shopping? N  Preparing Food and eating ? N  Using the Toilet? N  In  the past six months, have you accidently leaked urine? N  Do you have problems with loss of bowel control? N  Managing your Medications? N  Managing your Finances? N  Housekeeping or managing your Housekeeping? N  Some recent data might be hidden    Patient Care Team: Copland, Gay Filler, MD as PCP - General (Family Medicine) Park Liter, MD as PCP - Cardiology (Cardiology)  Indicate any recent Medical Services you may have received from other than [facility] providers in the past year (date may be approximate).     Assessment:   This is a routine wellness examination for Joshua [name].  Hearing/Vision screen Hearing Screening - Comments::  Bilateral hearing hearing Vision Screening - Comments:: Wears glasses Last eye exam-5 months ago-[facility]  Dietary issues and exercise activities discussed: Current Exercise Habits: Home exercise routine, Type of exercise: walking, Time (Minutes): 30, Frequency (Times/Week): 4, Weekly Exercise (Minutes/Week): 120, Intensity: Mild, Exercise limited by: None identified   Goals Addressed             This Visit's Progress    Patient Stated       Increase activity       Depression Screen PHQ 2/9 Scores 04/23/2021 04/13/2017  PHQ - 2 Score 0 0    Fall Risk Fall Risk  04/23/2021 10/13/2020 04/13/2017  Falls in the past year? 0 0 No  Number falls in past yr: 0 0 -  Injury with Fall? 0 0 -  Follow up Falls prevention discussed - -    FALL RISK PREVENTION PERTAINING TO THE HOME:  Any stairs in or around the home? No  Home free of loose throw rugs in walkways, pet beds, electrical cords, etc? Yes  Adequate lighting in your home to reduce risk of falls? Yes   ASSISTIVE DEVICES UTILIZED TO PREVENT FALLS:  Life alert? No  Use of a cane, walker or w/c? No  Grab bars in the bathroom? Yes  Shower chair or bench in shower? No  Elevated toilet seat or a handicapped toilet? No   TIMED UP AND GO:  Was the test performed? No . Phone  visit   Cognitive Function:Normal cognitive status assessed by  this Nurse Health Advisor. No abnormalities found.          Immunizations Immunization History  Administered Date(s) Administered   Fluad Quad(high Dose 65+) 05/11/2019, 05/14/2020   Influenza Split 04/01/2017   Influenza, High Dose Seasonal PF 04/01/2017, 03/22/2018   Influenza-Unspecified 05/21/2019   Moderna Sars-Covid-2 Vaccination 08/04/2019, 09/05/2019   Pneumococcal Conjugate-13 12/23/2017   Pneumococcal Polysaccharide-23 05/11/2019   Tdap 01/18/2018    TDAP status: Up to date  Flu Vaccine status: Due, Education has been provided regarding the importance of this vaccine. Advised may receive this vaccine at local pharmacy or Health Dept. Aware to provide a copy of the vaccination record if obtained from local pharmacy or Health Dept. Verbalized acceptance and understanding.  Pneumococcal vaccine status: Up to date  Covid-19 vaccine status: Information provided on how to obtain vaccines.   Qualifies for Shingles Vaccine? Yes   Zostavax completed No   Shingrix Completed?: No.    Education has been provided regarding the importance of this vaccine. Patient has been advised to call insurance company to determine out of pocket expense if they have not yet received this vaccine. Advised may also receive vaccine at local pharmacy or Health Dept. Verbalized acceptance and understanding.  Screening Tests Health Maintenance  Topic Date Due   Zoster Vaccines- Shingrix (1 of 2) Never done   COVID-19 Vaccine (3 - Booster for Moderna series) 10/31/2019   INFLUENZA VACCINE  09/25/2021 (Originally 01/26/2021)   COLONOSCOPY (Pts 45-38yrs Insurance coverage will need to be confirmed)  11/17/2022   TETANUS/TDAP  01/19/2028   Pneumonia Vaccine 39+ Years old  Completed   Hepatitis C Screening  Completed   HPV VACCINES  Aged Out    Health Maintenance  Health Maintenance Due  Topic Date Due   Zoster Vaccines- Shingrix (1  of 2) Never done   COVID-19 Vaccine (3 - Booster for Moderna series) 10/31/2019    Colorectal cancer screening: Type of screening: Colonoscopy. Completed 11/16/2017. Repeat every  5 years  Lung Cancer Screening: (Low Dose CT Chest recommended if Age 45-80 years, 30 pack-year currently smoking OR have quit w/in 15years.) does not qualify.     Additional Screening:  Hepatitis C Screening: Completed 12/22/2017  Vision Screening: Recommended annual ophthalmology exams for early detection of glaucoma and other disorders of the eye. Is the patient up to date with their annual eye exam?  Yes  Who is the provider or what is the name of the office in which the patient attends annual eye exams? Dr. Trinna Post   Dental Screening: Recommended annual dental exams for proper oral hygiene  Community Resource Referral / Chronic Care Management: CRR required this visit?  No   CCM required this visit?  No      Plan:     I have personally reviewed and noted the following in the patient's chart:   Medical and social history Use of alcohol, tobacco or illicit drugs  Current medications and supplements including opioid prescriptions. Patient is not currently taking opioid prescriptions. Functional ability and status Nutritional status Physical activity Advanced directives List of other physicians Hospitalizations, surgeries, and ER visits in previous 12 months Vitals Screenings to include cognitive, depression, and falls Referrals and appointments  In addition, I have reviewed and discussed with patient certain preventive protocols, quality metrics, and best practice recommendations. A written personalized care plan for preventive services as well as general preventive health recommendations were provided to patient.   Due to this being a telephonic visit, the after visit summary with patients personalized plan was offered to patient via mail or my-chart.  Patient would like to access on  my-chart.   Marta Antu, LPN   [id number]  Nurse Health Advisor  Nurse Notes: None   Medical screening examination/treatment/procedure(s) were performed by non-physician practitioner and as supervising provider I was immediately available for consultation/collaboration.  I agree with above. Marrian Salvage, FNP

## 2021-07-07 ENCOUNTER — Telehealth: Payer: Self-pay | Admitting: Internal Medicine

## 2021-07-07 ENCOUNTER — Other Ambulatory Visit: Payer: Self-pay | Admitting: Internal Medicine

## 2021-07-08 MED ORDER — ALBUTEROL SULFATE HFA 108 (90 BASE) MCG/ACT IN AERS: INHALATION_SPRAY | RESPIRATORY_TRACT | 6 refills | Status: DC

## 2021-07-08 MED ORDER — BUDESONIDE-FORMOTEROL FUMARATE 80-4.5 MCG/ACT IN AERO: INHALATION_SPRAY | RESPIRATORY_TRACT | 3 refills | Status: DC

## 2021-07-08 NOTE — Telephone Encounter (Signed)
LM informing patient refills have been sent in.   Nothing further needed at this time.

## 2021-07-13 ENCOUNTER — Encounter: Payer: Self-pay | Admitting: Family Medicine

## 2021-07-13 DIAGNOSIS — Z125 Encounter for screening for malignant neoplasm of prostate: Secondary | ICD-10-CM

## 2021-07-15 NOTE — Addendum Note (Signed)
Addended by: Manuela Schwartz on: 07/15/2021 11:47 AM   Modules accepted: Orders

## 2021-07-16 ENCOUNTER — Encounter: Payer: Self-pay | Admitting: Family Medicine

## 2021-07-16 ENCOUNTER — Other Ambulatory Visit (INDEPENDENT_AMBULATORY_CARE_PROVIDER_SITE_OTHER): Payer: Medicare Other

## 2021-07-16 DIAGNOSIS — Z125 Encounter for screening for malignant neoplasm of prostate: Secondary | ICD-10-CM | POA: Diagnosis not present

## 2021-07-16 LAB — PSA, MEDICARE: PSA: 0.97 ng/ml (ref 0.10–4.00)

## 2021-07-27 DIAGNOSIS — N401 Enlarged prostate with lower urinary tract symptoms: Secondary | ICD-10-CM | POA: Diagnosis not present

## 2021-07-27 DIAGNOSIS — N410 Acute prostatitis: Secondary | ICD-10-CM | POA: Diagnosis not present

## 2021-07-27 DIAGNOSIS — Z87442 Personal history of urinary calculi: Secondary | ICD-10-CM | POA: Diagnosis not present

## 2021-07-27 DIAGNOSIS — N138 Other obstructive and reflux uropathy: Secondary | ICD-10-CM | POA: Diagnosis not present

## 2021-08-07 ENCOUNTER — Other Ambulatory Visit: Payer: Self-pay | Admitting: Family Medicine

## 2021-08-07 DIAGNOSIS — I10 Essential (primary) hypertension: Secondary | ICD-10-CM

## 2021-08-28 ENCOUNTER — Ambulatory Visit: Payer: Medicare Other | Admitting: Gastroenterology

## 2021-08-28 ENCOUNTER — Other Ambulatory Visit: Payer: Self-pay | Admitting: Family Medicine

## 2021-08-28 DIAGNOSIS — A609 Anogenital herpesviral infection, unspecified: Secondary | ICD-10-CM

## 2021-09-18 DIAGNOSIS — H524 Presbyopia: Secondary | ICD-10-CM | POA: Diagnosis not present

## 2021-09-18 DIAGNOSIS — H52201 Unspecified astigmatism, right eye: Secondary | ICD-10-CM | POA: Diagnosis not present

## 2021-09-18 DIAGNOSIS — H11153 Pinguecula, bilateral: Secondary | ICD-10-CM | POA: Diagnosis not present

## 2021-09-18 DIAGNOSIS — Z83511 Family history of glaucoma: Secondary | ICD-10-CM | POA: Diagnosis not present

## 2021-09-18 DIAGNOSIS — H5203 Hypermetropia, bilateral: Secondary | ICD-10-CM | POA: Diagnosis not present

## 2021-09-18 DIAGNOSIS — H43393 Other vitreous opacities, bilateral: Secondary | ICD-10-CM | POA: Diagnosis not present

## 2021-09-18 DIAGNOSIS — H2513 Age-related nuclear cataract, bilateral: Secondary | ICD-10-CM | POA: Diagnosis not present

## 2021-09-20 ENCOUNTER — Other Ambulatory Visit: Payer: Self-pay | Admitting: Family Medicine

## 2021-09-20 DIAGNOSIS — A609 Anogenital herpesviral infection, unspecified: Secondary | ICD-10-CM

## 2021-09-21 ENCOUNTER — Ambulatory Visit (INDEPENDENT_AMBULATORY_CARE_PROVIDER_SITE_OTHER): Payer: Medicare Other | Admitting: Family Medicine

## 2021-09-21 ENCOUNTER — Other Ambulatory Visit: Payer: Self-pay | Admitting: Family Medicine

## 2021-09-21 ENCOUNTER — Encounter: Payer: Self-pay | Admitting: Family Medicine

## 2021-09-21 VITALS — BP 124/72 | HR 78 | Temp 98.0°F | Resp 16 | Ht 69.0 in | Wt 203.0 lb

## 2021-09-21 DIAGNOSIS — S0501XA Injury of conjunctiva and corneal abrasion without foreign body, right eye, initial encounter: Secondary | ICD-10-CM | POA: Diagnosis not present

## 2021-09-21 DIAGNOSIS — H1131 Conjunctival hemorrhage, right eye: Secondary | ICD-10-CM | POA: Diagnosis not present

## 2021-09-21 MED ORDER — TOBRAMYCIN 0.3 % OP SOLN: 1.0000 [drp] | OPHTHALMIC | 0 refills | Status: DC

## 2021-09-21 MED ORDER — POLYMYXIN B-TRIMETHOPRIM 10000-0.1 UNIT/ML-% OP SOLN: 1.0000 [drp] | OPHTHALMIC | 0 refills | Status: DC

## 2021-09-21 NOTE — Progress Notes (Signed)
Chief Complaint  ?Patient presents with  ? possible pink eye  ? ? ?Joshua [name] is here for right eye irritation. ? ?Duration: 1 day ?No itching. ?Fullness, slight stinging ?Chemical exposure? No  ?Recent URI?  1 mo ago ?Contact lenses? No  ?History of allergies? No  ?Treatment to date: none ?Pink eye possibly going around in family.  ? ?Past Medical History:  ?Diagnosis Date  ? Asthma   ? BPH (benign prostatic hyperplasia)   ? GERD (gastroesophageal reflux disease)   ? HLD (hyperlipidemia)   ? HSV infection   ? Hypertension   ? Kidney stones   ? ?Family History  ?Problem Relation Age of Onset  ? Alzheimer's disease Father   ? Kidney Stones Mother   ? Colon cancer Paternal Grandfather 38  ? ? ?BP 124/72   Pulse 78   Temp 98 ?F (36.7 ?C)   Resp 16   Ht '5\' 9"'$  (1.753 m)   Wt 203 lb (92.1 kg)   SpO2 97%   BMI 29.98 kg/m?  ?Gen: Awake, alert, appears stated age ?Eyes: Lids nml, Sclera white b/l w exception of medial blood noted, PERRLA, EOMi, no ttp to light palpation over globes (eyes closed). Fluorescin dye exam showed some pick up in the infero-medial portion of eye over hemorrhage.  ?Nose: Nares patent without discharge ?Mouth: MMM, pharynx without erythema or exudate ?Psych: Age appropriate judgment and insight; mood and affect normal ? ?Subconjunctival hemorrhage of right eye ? ?Abrasion of right cornea, initial encounter - Plan: trimethoprim-polymyxin b (POLYTRIM) ophthalmic solution ? ?Reassurance. Should take several weeks to resolve. ?Might have a small abrasion. 7-10 d of abx drops to prevent infection. Artificial tears rec'd.  ?F/u prn.  ?Pt voiced understanding and agreement to the plan. ? ?Shelda Pal, DO ?09/21/21 ?2:06 PM ? ?

## 2021-09-21 NOTE — Patient Instructions (Signed)
Artificial tears like Refresh and Systane may be used for comfort. OK to get generic version. Generally people use them every 2-4 hours, but you can use them as much as you want because there is no medication in it. ? ?The redness will steadily resolve over the next few weeks.  ? ?Let us know if you need anything. ?

## 2021-09-28 ENCOUNTER — Other Ambulatory Visit: Payer: Self-pay

## 2021-09-28 ENCOUNTER — Other Ambulatory Visit (INDEPENDENT_AMBULATORY_CARE_PROVIDER_SITE_OTHER): Payer: Medicare Other

## 2021-09-28 ENCOUNTER — Encounter: Payer: Self-pay | Admitting: Gastroenterology

## 2021-09-28 ENCOUNTER — Ambulatory Visit (INDEPENDENT_AMBULATORY_CARE_PROVIDER_SITE_OTHER): Payer: Medicare Other | Admitting: Gastroenterology

## 2021-09-28 VITALS — BP 134/80 | HR 90 | Ht 66.0 in | Wt 201.0 lb

## 2021-09-28 DIAGNOSIS — K219 Gastro-esophageal reflux disease without esophagitis: Secondary | ICD-10-CM | POA: Diagnosis not present

## 2021-09-28 DIAGNOSIS — Z79899 Other long term (current) drug therapy: Secondary | ICD-10-CM | POA: Diagnosis not present

## 2021-09-28 DIAGNOSIS — R140 Abdominal distension (gaseous): Secondary | ICD-10-CM | POA: Diagnosis not present

## 2021-09-28 LAB — COMPREHENSIVE METABOLIC PANEL
ALT: 17 U/L (ref 0–53)
AST: 17 U/L (ref 0–37)
Albumin: 4.3 g/dL (ref 3.5–5.2)
Alkaline Phosphatase: 68 U/L (ref 39–117)
BUN: 15 mg/dL (ref 6–23)
CO2: 28 mEq/L (ref 19–32)
Calcium: 9.2 mg/dL (ref 8.4–10.5)
Chloride: 104 mEq/L (ref 96–112)
Creatinine, Ser: 1.05 mg/dL (ref 0.40–1.50)
GFR: 72.29 mL/min (ref >60.00)
Glucose, Bld: 98 mg/dL (ref 70–99)
Potassium: 4.1 mEq/L (ref 3.5–5.1)
Sodium: 137 mEq/L (ref 135–145)
Total Bilirubin: 0.6 mg/dL (ref 0.2–1.2)
Total Protein: 7 g/dL (ref 6.0–8.3)

## 2021-09-28 LAB — CBC WITH DIFFERENTIAL/PLATELET
Basophils Absolute: 0 10*3/uL (ref 0.0–0.1)
Basophils Relative: 0.7 % (ref 0.0–3.0)
Eosinophils Absolute: 0.2 10*3/uL (ref 0.0–0.7)
Eosinophils Relative: 4.2 % (ref 0.0–5.0)
HCT: 44.7 % (ref 39.0–52.0)
Hemoglobin: 14.8 g/dL (ref 13.0–17.0)
Lymphocytes Relative: 23.2 % (ref 12.0–46.0)
Lymphs Abs: 1.3 10*3/uL (ref 0.7–4.0)
MCHC: 33.1 g/dL (ref 30.0–36.0)
MCV: 82.3 fl (ref 78.0–100.0)
Monocytes Absolute: 0.5 10*3/uL (ref 0.1–1.0)
Monocytes Relative: 9.3 % (ref 3.0–12.0)
Neutro Abs: 3.4 10*3/uL (ref 1.4–7.7)
Neutrophils Relative %: 62.6 % (ref 43.0–77.0)
Platelets: 219 10*3/uL (ref 150.0–400.0)
RBC: 5.43 Mil/uL (ref 4.22–5.81)
RDW: 13.7 % (ref 11.5–15.5)
WBC: 5.4 10*3/uL (ref 4.0–10.5)

## 2021-09-28 MED ORDER — PANTOPRAZOLE SODIUM 20 MG PO TBEC: 20.0000 mg | DELAYED_RELEASE_TABLET | Freq: Every day | ORAL | 1 refills | Status: DC

## 2021-09-28 MED ORDER — METRONIDAZOLE 500 MG PO TABS: 500.0000 mg | ORAL_TABLET | Freq: Two times a day (BID) | ORAL | 0 refills | Status: DC

## 2021-09-28 NOTE — Progress Notes (Signed)
? ?HPI :  ?71 year old male with a history of GERD, hypertension, renal stones, colon polyps, here to reestablish care for symptoms of bloating and increased intestinal gas, and history of GERD.  He was last seen in May 2019, referred here by Dr. Janett Billow Copland. ? ?He states for the past few years he has had intestinal gas with bloating and flatulence that has bothered him.  States during the day he is actually okay and does not have too much symptoms, but typically after dinner and through the night he will feel a lot of flatulence and increased gas that can bother him.  He will have feelings of bloating associated with this but denies much abdominal distention.  He denies any focal abdominal pains with this.  He denies any symptoms of constipation or diarrhea.  States his bowels are regular.  Denies any blood in his stool, no nausea or vomiting.  He has occasional belching.  He does notice that eating cabbage can make his symptoms significantly worse, has a hard time identifying other foods that can trigger this but he does not eat a lot of vegetables.  He denies any otherwise new changes to his diet since his symptoms started.  Denies any NSAID use.  He has tried a few courses of different probiotics in the past which did not help at all.  In fact his last course he thinks may have made him worse, he is tried align and Publishing copy.  He was on doxycycline for history of prostatitis and thought his symptoms did get better.  Interestingly when he traveled to [place] and [place] in the past his symptoms are much better. No weight loss ? ?He does have a history of reflux and takes Protonix 40 mg once daily every day.  This has worked well to control his symptoms of reflux over the years.  Recall he did have a history of H. pylori and PUD several years ago, has been on multiple PPIs in the past.  No dysphagia.  He had an EGD with me in 2019 which showed no evidence of Barrett's esophagus, but did have Candida  esophagitis for which he was treated. ? ?No recent labs on file. ?  ?Prior workup: ?EGD 06/24/2012 - mild esophagitis, normal exam otherwise ?Colonoscopy 01/24/2007 - normal exam ? ? ?EGD - 11/16/2017 -  ?- A 1 cm hiatal hernia was present. ?- Multiple diminutive white plaques were found in the upper third of the esophagus and in the ?middle third of the esophagus. Brushings for KOH prep were obtained in the entire ?esophagus. ?- The exam of the esophagus was otherwise normal. No evidence of Barrett's esophagus. ?- The entire examined stomach was normal. ?- Benign small lymphangiectasias were present in the examined duodenum. ?- The exam of the duodenum was otherwise normal. ? ? ?Colonoscopy 11/16/2017 - The perianal and digital rectal examinations were normal. ?- A 4 mm polyp was found in the cecum. The polyp was sessile. The polyp was removed ?with a cold snare. Resection and retrieval were complete. ?- A 7 mm polyp was found in the ascending colon. The polyp was sessile. The polyp was ?removed with a cold snare. Resection and retrieval were complete. ?- Multiple medium-mouthed diverticula were found in the transverse colon and left colon. ?- Internal hemorrhoids were found during retroflexion. ?- The exam was otherwise without abnormality. ? ?Adequacy Reason ?Satisfactory For Evaluation. ?Diagnosis ?ESOPHAGEAL BRUSHING(SPECIMEN 1 OF 1 COLLECTED 11/16/17): ?- BENIGN REACTIVE/REPARATIVE CHANGES ?- FUNGAL ORGANISMS PRESENT CONSISTENT WITH CANDIDA  SP. ? ?Diagnosis ?Surgical [P], ascending, cecum, and ileocecal valve, polyp (3) ?- SESSILE SERRATED POLYP(S) WITHOUT CYTOLOGIC DYSPLASIA. ? ?Repeat in 5 years ? ? ? ?Past Medical History:  ?Diagnosis Date  ? Asthma   ? BPH (benign prostatic hyperplasia)   ? GERD (gastroesophageal reflux disease)   ? HLD (hyperlipidemia)   ? HSV infection   ? Hypertension   ? Kidney stones   ? ? ? ?Past Surgical History:  ?Procedure Laterality Date  ? APPENDECTOMY    ? BREAST CYST EXCISION Left    ? benign  ? LITHOTRIPSY    ? TONSILLECTOMY    ? ?Family History  ?Problem Relation Age of Onset  ? Kidney Stones Mother   ? Alzheimer's disease Father   ? Colon cancer Paternal Grandfather 41  ? Stomach cancer Neg Hx   ? Esophageal cancer Neg Hx   ? Pancreatic cancer Neg Hx   ? ?Social History  ? ?Tobacco Use  ? Smoking status: Former  ?  Packs/day: 0.50  ?  Years: 22.00  ?  Pack years: 11.00  ?  Types: Cigarettes  ?  Quit date: 06/28/1993  ?  Years since quitting: 28.2  ? Smokeless tobacco: Never  ?Vaping Use  ? Vaping Use: Never used  ?Substance Use Topics  ? Alcohol use: Yes  ?  Comment: occasionally  ? Drug use: No  ? ?Current Outpatient Medications  ?Medication Sig Dispense Refill  ? acyclovir ointment (ZOVIRAX) 5 % Apply 1 application topically every 3 (three) hours. Use for one week as needed for outbreak 30 g 3  ? albuterol (VENTOLIN HFA) 108 (90 Base) MCG/ACT inhaler INHALE 1 TO 2 PUFFS INTO THE LUNGS EVERY 6 HOURS AS NEEDED FOR WHEEZING OR SHORTNESS OF BREATH 8.5 g 6  ? alfuzosin (UROXATRAL) 10 MG 24 hr tablet Take 1 tablet (10 mg total) by mouth daily. 90 tablet 3  ? atorvastatin (LIPITOR) 10 MG tablet TAKE 1 TABLET BY MOUTH DAILY 90 tablet 3  ? budesonide-formoterol (SYMBICORT) 80-4.5 MCG/ACT inhaler INHALE 2 PUFFS BY MOUTH FIRST THING IN THE MORNING THEN INHALE 2 PUFFS BY MOUTH 12 HOURS LATER 12.6 g 3  ? diclofenac Sodium (VOLTAREN) 1 % GEL Apply 2 g topically 4 (four) times daily. Use as needed for pain 100 g 3  ? losartan (COZAAR) 100 MG tablet TAKE 1 TABLET(100 MG) BY MOUTH DAILY 90 tablet 3  ? pantoprazole (PROTONIX) 40 MG tablet TAKE 1 TABLET BY MOUTH TWICE DAILY 30- 60 MINUTES BEFORE FIRST AND LAST MEAL OF THE DAY 90 tablet 1  ? tadalafil (CIALIS) 5 MG tablet Take 5 mg by mouth daily.    ? tobramycin (TOBREX) 0.3 % ophthalmic solution Place 1 drop into the right eye every 4 (four) hours. 5 mL 0  ? valACYclovir (VALTREX) 500 MG tablet TAKE 1 TABLET BY MOUTH EVERY DAY 30 tablet 0  ? ?No current  facility-administered medications for this visit.  ? ?No Known Allergies ? ? ?Review of Systems: ?All systems reviewed and negative except where noted in HPI.  ? ?Physical Exam: ?BP 134/80   Pulse 90   Ht '5\' 6"'$  (1.676 m)   Wt 201 lb (91.2 kg)   SpO2 97%   BMI 32.44 kg/m?  ?Constitutional: Pleasant,well-developed, male in no acute distress. ?HEENT: Normocephalic and atraumatic. Conjunctivae are normal. No scleral icterus. ?Neck supple.  ?Cardiovascular: Normal rate, regular rhythm.  ?Pulmonary/chest: Effort normal and breath sounds normal.  ?Abdominal: Soft, nondistended, nontender. There are no masses  palpable. No hepatomegaly. ?Extremities: no edema ?Lymphadenopathy: No cervical adenopathy noted. ?Neurological: Alert and oriented to person place and time. ?Skin: Skin is warm and dry. No rashes noted. ?Psychiatric: Normal mood and affect. Behavior is normal. ? ? ?ASSESSMENT AND PLAN: ?71 year old male here to reestablish care for the following: ? ?Bloating / intestinal gas ?GERD ?Long term use of PPI ? ?History as outlined above, increase gas and bloating as described.  Probiotics have not helped, in fact may have made worse.  Discussed physiology of intestinal gas and bloating.  He does have some particular dietary food triggers and potentially dietary related.  I gave him a handout about a low FODMAP diet and he will review to see if there is any high risk foods that could be contributing to this.  I will screen him for celiac disease to make sure okay and check some basic labs since he has not had blood work in a while.  I otherwise reassured him that his colonoscopy is up-to-date and generally gas and bloating is not a sign of concerning pathology.  I discussed other options to treat this with him.  I think he may really benefit from a course of rifaximin given he had an improvement with other antibiotics in the past.  Unfortunately given his insurance I do not think this will likely be covered and would be  quite expensive.  I do not have any free samples in the office I can give him today.  As an alternative I offered him an empiric trial of Flagyl 500 mg twice daily for 7 to 10 days to see if that will help him.  He wi

## 2021-09-28 NOTE — Patient Instructions (Addendum)
If you are age 70 or older, your body mass index should be between 23-30. Your Body mass index is 32.44 kg/m?Marland Kitchen If this is out of the aforementioned range listed, please consider follow up with your Primary Care Provider. ? ?If you are age 49 or younger, your body mass index should be between 19-25. Your Body mass index is 32.44 kg/m?Marland Kitchen If this is out of the aformentioned range listed, please consider follow up with your Primary Care Provider.  ? ?________________________________________________________ ? ?The [facility] providers would like to encourage you to use [facility] to communicate with providers for non-urgent requests or questions.  Due to long hold times on the telephone, sending your provider a message by [facility] may be a faster and more efficient way to get a response.  Please allow 48 business hours for a response.  Please remember that this is for non-urgent requests.  ?_______________________________________________________ ? ?Discontinue Probiotic. ? ?We have sent the following medications to your pharmacy for you to pick up at your convenience: ?Protonix 20 mg Once daily. You can increase to twice daily if needed. ?Flagyl: Take twice a day for 10 days ? ?We are giving you a Low-FODMAP diet handout today. FODMAPs are short-chain carbohydrates (sugars) that are highly fermentable, which means that they go through chemical changes in the GI system, and are poorly absorbed during digestion. When FODMAPs reach the colon (large intestine), bacteria ferment these sugars, turning them into gas and chemicals. This stretches the walls of the colon, causing abdominal bloating, distension, cramping, pain, and/or changes in bowel habits in many patients with IBS. FODMAPs are not unhealthy or harmful, but may exacerbate GI symptoms in those with sensitive GI tracts. ? ?Please go to the lab in the basement of our building to have lab work done as you leave today. Hit "B" for basement when you get on the elevator.   When the doors open the lab is on your left.  We will call you with the results. Thank you. ? ?Thank you for entrusting me with your care and for choosing [employer], ?Dr.  Cellar ? ? ? ? ? ? ?

## 2021-09-29 LAB — IGA: Immunoglobulin A: 232 mg/dL (ref 70–320)

## 2021-09-29 LAB — TISSUE TRANSGLUTAMINASE, IGA: (tTG) Ab, IgA: <1 U/mL

## 2021-10-06 DIAGNOSIS — Z87438 Personal history of other diseases of male genital organs: Secondary | ICD-10-CM | POA: Diagnosis not present

## 2021-10-06 DIAGNOSIS — N410 Acute prostatitis: Secondary | ICD-10-CM | POA: Diagnosis not present

## 2021-10-06 DIAGNOSIS — Z87442 Personal history of urinary calculi: Secondary | ICD-10-CM | POA: Diagnosis not present

## 2021-10-06 DIAGNOSIS — N138 Other obstructive and reflux uropathy: Secondary | ICD-10-CM | POA: Diagnosis not present

## 2021-10-06 DIAGNOSIS — N401 Enlarged prostate with lower urinary tract symptoms: Secondary | ICD-10-CM | POA: Diagnosis not present

## 2021-10-13 ENCOUNTER — Other Ambulatory Visit: Payer: Self-pay | Admitting: Family Medicine

## 2021-10-13 DIAGNOSIS — A609 Anogenital herpesviral infection, unspecified: Secondary | ICD-10-CM

## 2021-10-13 NOTE — Telephone Encounter (Signed)
Left message on machine to call back to schedule follow up appointment. ?

## 2021-10-27 ENCOUNTER — Ambulatory Visit: Payer: Medicare Other | Admitting: Internal Medicine

## 2021-11-11 ENCOUNTER — Other Ambulatory Visit: Payer: Self-pay | Admitting: Family Medicine

## 2021-11-11 DIAGNOSIS — A609 Anogenital herpesviral infection, unspecified: Secondary | ICD-10-CM

## 2021-11-14 NOTE — Progress Notes (Signed)
[place] [facility] [address], [place], [place] [address] [phone]  Date:  11/16/2021   Name:  Joshua [name]   DOB:  1951/04/27   MRN:  [id number]  PCP:  Darreld Mclean, MD    Chief Complaint: Annual OV (Concerns/ questions: sciatica left leg/)   History of Present Illness:  Joshua [name] is a 71 y.o. very pleasant male patient who presents with the following:  Patient seen today for annual exam/Medicare-  History of hypertension, mild asthma, BPH, dyslipidemia, cataracts Most recent visit with myself about 1 year ago His pulmonologist is Dr. Melvyn Novas- visit tomorrow Urologist is Dr.Budzyn with [facility], most recent visit last month:  BPH with obstruction/lower urinary tract symptoms Yes   History of nephrolithiasis   History of prostatitis   Acute prostatitis  Plan: 1. Continue tadalafil and alfuzosin for BPH 2. Prostate cancer screening today 3. Follow-up in 1 year  Shingrix-recommend COVID-19 booster-recommend Colonoscopy due next year -reminded patient  Albuterol as needed\Symbicort uroxatral Lipitor 10 Losartan  He does not get that much exercise   He has noted left sided sciatica pain for about a year - it may run down to the mid thigh and can make his middle toes feel numb  No recent x-rays of his back He is not aware of any particular injury Patient Active Problem List   Diagnosis Date Noted   Rhinitis 06/09/2020   Healthcare maintenance 06/09/2020   Vitreous floater, bilateral 05/15/2019   Nuclear sclerotic cataract of both eyes 05/15/2019   Precordial chest pain 04/06/2018   Dyspnea on exertion 04/06/2018   Dyslipidemia 04/06/2018   BPH with urinary obstruction 03/31/2018   Essential hypertension 01/17/2018   Solitary lung nodule 09/07/2017   Sinusitis, chronic/Cough 07/22/2016   Chronic asthma, mild persistent, uncomplicated 01/06/1974   Upper airway cough syndrome 06/29/2016    Past  Medical History:  Diagnosis Date   Asthma    BPH (benign prostatic hyperplasia)    GERD (gastroesophageal reflux disease)    HLD (hyperlipidemia)    HSV infection    Hypertension    Kidney stones     Past Surgical History:  Procedure Laterality Date   APPENDECTOMY     BREAST CYST EXCISION Left    benign   LITHOTRIPSY     TONSILLECTOMY      Social History   Tobacco Use   Smoking status: Former    Packs/day: 0.50    Years: 22.00    Pack years: 11.00    Types: Cigarettes    Quit date: 06/28/1993    Years since quitting: 28.4   Smokeless tobacco: Never  Vaping Use   Vaping Use: Never used  Substance Use Topics   Alcohol use: Yes    Comment: occasionally   Drug use: No    Family History  Problem Relation Age of Onset   Kidney Stones Mother    Alzheimer's disease Father    Colon cancer Paternal Grandfather 40   Stomach cancer Neg Hx    Esophageal cancer Neg Hx    Pancreatic cancer Neg Hx     No Known Allergies  Medication list has been reviewed and updated.  Current Outpatient Medications on File Prior to Visit  Medication Sig Dispense Refill   acyclovir ointment (ZOVIRAX) 5 % Apply 1 application topically every 3 (three) hours. Use for one week as needed for outbreak 30 g 3   albuterol (VENTOLIN HFA) 108 (90  Base) MCG/ACT inhaler INHALE 1 TO 2 PUFFS INTO THE LUNGS EVERY 6 HOURS AS NEEDED FOR WHEEZING OR SHORTNESS OF BREATH 8.5 g 6   alfuzosin (UROXATRAL) 10 MG 24 hr tablet Take 1 tablet (10 mg total) by mouth daily. 90 tablet 3   budesonide-formoterol (SYMBICORT) 80-4.5 MCG/ACT inhaler INHALE 2 PUFFS BY MOUTH FIRST THING IN THE MORNING THEN INHALE 2 PUFFS BY MOUTH 12 HOURS LATER 12.6 g 3   diclofenac Sodium (VOLTAREN) 1 % GEL Apply 2 g topically 4 (four) times daily. Use as needed for pain 100 g 3   pantoprazole (PROTONIX) 20 MG tablet Take 1 tablet (20 mg total) by mouth daily. Can increase to twice daily if needed 180 tablet 1   tobramycin (TOBREX) 0.3 %  ophthalmic solution Place 1 drop into the right eye every 4 (four) hours. 5 mL 0   valACYclovir (VALTREX) 500 MG tablet TAKE 1 TABLET BY MOUTH EVERY DAY 30 tablet 0   No current facility-administered medications on file prior to visit.    Review of Systems:  As per HPI- otherwise negative.   Physical Examination: Vitals:   11/16/21 1429  BP: 140/80  Pulse: 73  Resp: 18  Temp: 98.4 F (36.9 C)  SpO2: 96%   Vitals:   11/16/21 1429  Weight: 198 lb (89.8 kg)   Body mass index is 31.96 kg/m. Ideal Body Weight:    GEN: no acute distress. Mild obesity, looks well  HEENT: Atraumatic, Normocephalic. Bilateral TM wnl, oropharynx normal.  PEERL,EOMI. hearing aids bilaterally Ears and Nose: No external deformity. CV: RRR, No M/G/R. No JVD. No thrill. No extra heart sounds. PULM: CTA B, no wheezes, crackles, rhonchi. No retractions. No resp. distress. No accessory muscle use. ABD: S, NT, ND, +BS. No rebound. No HSM. EXTR: No c/c/e PSYCH: Normally interactive. Conversant.  I am not able to reproduce his back pain with palpation of his back or left sciatic notch  Assessment and Plan: Essential hypertension - Plan: losartan (COZAAR) 100 MG tablet  Dyslipidemia - Plan: Lipid panel, atorvastatin (LIPITOR) 10 MG tablet  Elevated glucose - Plan: Hemoglobin A1c  Benign prostatic hyperplasia with incomplete bladder emptying - Plan: tadalafil (CIALIS) 5 MG tablet  Sciatica, left side - Plan: DG Lumbar Spine Complete  Patient seen today for follow-up.  Blood pressure under good control, continue losartan Refill atorvastatin A1c, lipid panel are pending His urologist prescribed daily Cialis for incomplete bladder emptying/BPH, he has had some trouble getting this filled.  Wrote prescription for him today We will obtain x-rays to further evaluate persistent sciatica symptoms Signed Lamar Blinks, MD   Addendum 5/23, received labs as below.  Message to patient  Results for orders  placed or performed in visit on 11/16/21  Hemoglobin A1c  Result Value Ref Range   Hgb A1c MFr Bld 5.4 4.6 - 6.5 %  Lipid panel  Result Value Ref Range   Cholesterol 130 0 - 200 mg/dL   Triglycerides 107.0 0.0 - 149.0 mg/dL   HDL 51.60 >39.00 mg/dL   VLDL 21.4 0.0 - 40.0 mg/dL   LDL Cholesterol 57 0 - 99 mg/dL   Total CHOL/HDL Ratio 3    NonHDL 78.78

## 2021-11-16 ENCOUNTER — Ambulatory Visit (INDEPENDENT_AMBULATORY_CARE_PROVIDER_SITE_OTHER): Payer: Medicare Other | Admitting: Family Medicine

## 2021-11-16 VITALS — BP 140/80 | HR 73 | Temp 98.4°F | Resp 18 | Wt 198.0 lb

## 2021-11-16 DIAGNOSIS — R7309 Other abnormal glucose: Secondary | ICD-10-CM | POA: Diagnosis not present

## 2021-11-16 DIAGNOSIS — M5432 Sciatica, left side: Secondary | ICD-10-CM | POA: Diagnosis not present

## 2021-11-16 DIAGNOSIS — R3914 Feeling of incomplete bladder emptying: Secondary | ICD-10-CM | POA: Diagnosis not present

## 2021-11-16 DIAGNOSIS — N401 Enlarged prostate with lower urinary tract symptoms: Secondary | ICD-10-CM

## 2021-11-16 DIAGNOSIS — I10 Essential (primary) hypertension: Secondary | ICD-10-CM | POA: Diagnosis not present

## 2021-11-16 DIAGNOSIS — E785 Hyperlipidemia, unspecified: Secondary | ICD-10-CM

## 2021-11-16 MED ORDER — TADALAFIL 5 MG PO TABS: 5.0000 mg | ORAL_TABLET | Freq: Every day | ORAL | 3 refills | Status: DC

## 2021-11-16 MED ORDER — LOSARTAN POTASSIUM 100 MG PO TABS: ORAL_TABLET | ORAL | 3 refills | Status: DC

## 2021-11-16 MED ORDER — ATORVASTATIN CALCIUM 10 MG PO TABS: ORAL_TABLET | ORAL | 3 refills | Status: DC

## 2021-11-16 NOTE — Patient Instructions (Addendum)
Good to see you again today- I will be in touch with your labs asap You are due for a colonoscopy next year Please come back and do your lower back x-rays at your convenience  Consider getting the shingles series and a covid booster at the pharmacy if not done already

## 2021-11-17 ENCOUNTER — Ambulatory Visit (INDEPENDENT_AMBULATORY_CARE_PROVIDER_SITE_OTHER): Payer: Medicare Other | Admitting: Internal Medicine

## 2021-11-17 ENCOUNTER — Encounter: Payer: Self-pay | Admitting: Internal Medicine

## 2021-11-17 ENCOUNTER — Encounter: Payer: Self-pay | Admitting: Family Medicine

## 2021-11-17 DIAGNOSIS — J4530 Mild persistent asthma, uncomplicated: Secondary | ICD-10-CM | POA: Diagnosis not present

## 2021-11-17 DIAGNOSIS — N401 Enlarged prostate with lower urinary tract symptoms: Secondary | ICD-10-CM

## 2021-11-17 LAB — LIPID PANEL
Cholesterol: 130 mg/dL (ref 0–200)
HDL: 51.6 mg/dL (ref >39.00)
LDL Cholesterol: 57 mg/dL (ref 0–99)
NonHDL: 78.78
Total CHOL/HDL Ratio: 3
Triglycerides: 107 mg/dL (ref 0.0–149.0)
VLDL: 21.4 mg/dL (ref 0.0–40.0)

## 2021-11-17 LAB — HEMOGLOBIN A1C: Hgb A1c MFr Bld: 5.4 % (ref 4.6–6.5)

## 2021-11-17 MED ORDER — ALBUTEROL SULFATE HFA 108 (90 BASE) MCG/ACT IN AERS: INHALATION_SPRAY | RESPIRATORY_TRACT | 2 refills | Status: DC

## 2021-11-17 MED ORDER — BUDESONIDE-FORMOTEROL FUMARATE 80-4.5 MCG/ACT IN AERO: INHALATION_SPRAY | RESPIRATORY_TRACT | 3 refills | Status: DC

## 2021-11-17 NOTE — Assessment & Plan Note (Signed)
Onset around 2000 in [place] with allergy testing neg there 06/29/2016    reduce symbicort to 80 2 bid  - FENO 07/21/2016  =   24 on symb 80 2bid - Spirometry 07/21/2016  wnl including  Mid flows p am symbicort > try off > flared so restarted  - 09/05/2017    try symb increase to 160  - PFT's  10/03/2017  FEV1 2.57 (88 % ) ratio 67  p 21 % improvement from saba p symb 160 prior to study with DLCO  108 % corrects to 113 % for alv volume  With variable truncation of insp portion of f/v loop and reported better benefit from 80 than 160 strength c/w uacs / vcd component  - 05/31/2018 flare on symb 80 2bid  - 07/18/2018 added singulair trial > d/c 01/23/2019  - 07/18/18 Spirometry   FEV1 2.6 (88%)  Ratio 0.80  - 09/01/2018  After extensive coaching inhaler device,  effectiveness =    75% (short Ti) rec try symb 80 just one bid to see if helps hoarseness or flares asthma > consider adding spacer next  - try off singulair 01/23/2019  - 11/17/2021  After extensive coaching inhaler device,  effectiveness =    90% from a baseline of 75% > no change symb 80 2bid and approp saba using the rule of 2's/ reviewed  All goals of chronic asthma control met including optimal function and elimination of symptoms with minimal need for rescue therapy.  Contingencies discussed in full including contacting this office immediately if not controlling the symptoms using the rule of two's.     F/u qw 49m sooner prn          Each maintenance medication was reviewed in detail including emphasizing most importantly the difference between maintenance and prns and under what circumstances the prns are to be triggered using an action plan format where appropriate.  Total time for H and P, chart review, counseling, reviewing hfa device(s) and generating customized AVS unique to this office visit / same day charting = 24 min

## 2021-11-17 NOTE — Patient Instructions (Addendum)
Plan A = Automatic = Always=    symbicort 80 Take 2 puffs first thing in am and then another 2 puffs about 12 hours later.    Work on inhaler technique:  relax and gently blow all the way out then take a nice smooth full deep breath back in, triggering the inhaler at same time you start breathing in.  Hold for up to 5 seconds if you can. Blow out thru nose. Rinse and gargle with water when done.  If mouth or throat bother you at all,  try brushing teeth/gums/tongue with arm and hammer toothpaste/ make a slurry and gargle and spit out.     Plan B = Backup (to supplement plan A, not to replace it) Only use your albuterol inhaler as a rescue medication to be used if you can't catch your breath by resting or doing a relaxed purse lip breathing pattern.  - The less you use it, the better it will work when you need it. - Ok to use the inhaler up to 2 puffs  every 4 hours if you must but call for appointment if use goes up over your usual need - Don't leave home without it !!  (think of it like the spare tire for your car)     Please schedule a follow up visit in 6 months but call sooner if needed

## 2021-11-17 NOTE — Progress Notes (Signed)
Subjective:    Patient ID: Joshua [name], male   DOB: 23-May-1951    MRN: [id number]    Brief patient profile:  71  yo [place] male  with bad bronchitis around 1993 and quit smoking and then  all better until around 2000 while living in [place] with intermittent "wheezing" dx as asthma > never resolved  Self referred for dtc asthma  Allergy testing in [place] around age 71 = neg per pt    History of Present Illness  06/29/2016 1st [facility] office visit/ Khylee Algeo   Chief Complaint  Patient presents with   Pulmonary Consult    moved from [place], chronic bronchitis, wheezing, dx with asthma in 2000, cold humidity makes it worse  maint on symb 160 2bid and at baseline intermittently noisy breathing one breath q 2-3 days  s pattern though not waking him  noct   Worse since  Jun 22 2016  with uri/ sinus symptoms >  [facility] rx  Amox/only a little better, still severe nasal congestion/severe fits of day > noct cough and subj wheeze.  Typical triggers are uri/not seasonal pattern rhintiis - happens up to 4 x yearly with or without symbicort and just as severe. rec Augmentin 875 mg take one pill twice daily  X 10 days   Please see patient coordinator before you leave today  to schedule CT sinus  in 10 days - no sooner Use nasal saline as much as possible to help moisturize your passages  Continue protonix but take it Take 30-60 min before first meal of the day  Prednisone 10 mg take  4 each am x 2 days,   2 each am x 2 days,  1 each am x 2 days and stop  Work on inhaler technique:  Plan A = Automatic = symbicort 80 Take 2 puffs first thing in am and then another 2 puffs about 12 hours later.  Plan B = Backup Only use your albuterol (Proir) as a rescue medication to be used if you can't catch your breath by resting or doing a relaxed purse lip breathing pattern.  - The less you use it, the better it will work when you need it. - Ok to use the inhaler up to 2 puffs  every 4 hours     07/21/2016  f/u  ov/Trudie Cervantes re: uacs vs asthma Chief Complaint  Patient presents with   Follow-up    Cough is much improved, but has not completely resolved. Breathing has improved.   cough is worse at bedtime and in am p rising but not waking up prematurely and not productive  Bloody nasal d/c / no sob at all  rec Use lots of saline nasal spray on your nose Please remember to go to the x-ray department downstairs for your tests - we will call you with the results when they are available. Ty off the symbicort to see if any worse and if so restart  At 2 pffs every 12 hour schedule ENT  >   [name] rec abx and f/u prn     11/17/2021  f/u ov/Minnetta Sandora re: uacs vs cough variant asthma maint on symbicort 80 2bid   Chief Complaint  Patient presents with   Follow-up    No complaints.   Dyspnea:  MMRC1 = can walk nl pace, flat grade, can't hurry or go uphills or steps s sob - pushes mower x 30 min    Cough: min in am  Sleeping: no resp cc  SABA use: 1-2 x weekly      No obvious day to day or daytime variability or assoc excess/ purulent sputum or mucus plugs or hemoptysis or cp or chest tightness, subjective wheeze or overt sinus or hb symptoms.   Sleeping  without nocturnal  or early am exacerbation  of respiratory  c/o's or need for noct saba. Also denies any obvious fluctuation of symptoms with weather or environmental changes or other aggravating or alleviating factors except as outlined above   No unusual exposure hx or h/o childhood pna/ asthma or knowledge of premature birth.  Current Allergies, Complete Past Medical History, Past Surgical History, Family History, and Social History were reviewed in [employer] record.  ROS  The following are not active complaints unless bolded Hoarseness, sore throat, dysphagia, dental problems, itching, sneezing,  nasal congestion or discharge of excess mucus or purulent secretions, ear ache,   fever, chills, sweats, unintended wt loss or wt gain,  classically pleuritic or exertional cp,  orthopnea pnd or arm/hand swelling  or leg swelling, presyncope, palpitations, abdominal pain, anorexia, nausea, vomiting, diarrhea  or change in bowel habits or change in bladder habits, change in stools or change in urine, dysuria, hematuria,  rash, arthralgias, visual complaints, headache, numbness, weakness or ataxia or problems with walking or coordination,  change in mood or  memory.        Current Meds  Medication Sig   albuterol (VENTOLIN HFA) 108 (90 Base) MCG/ACT inhaler INHALE 1 TO 2 PUFFS INTO THE LUNGS EVERY 6 HOURS AS NEEDED FOR WHEEZING OR SHORTNESS OF BREATH   alfuzosin (UROXATRAL) 10 MG 24 hr tablet Take 1 tablet (10 mg total) by mouth daily.   atorvastatin (LIPITOR) 10 MG tablet TAKE 1 TABLET BY MOUTH DAILY   budesonide-formoterol (SYMBICORT) 80-4.5 MCG/ACT inhaler INHALE 2 PUFFS BY MOUTH FIRST THING IN THE MORNING THEN INHALE 2 PUFFS BY MOUTH 12 HOURS LATER   diclofenac Sodium (VOLTAREN) 1 % GEL Apply 2 g topically 4 (four) times daily. Use as needed for pain   losartan (COZAAR) 100 MG tablet TAKE 1 TABLET(100 MG) BY MOUTH DAILY   pantoprazole (PROTONIX) 20 MG tablet Take 1 tablet (20 mg total) by mouth daily. Can increase to twice daily if needed   tadalafil (CIALIS) 5 MG tablet Take 1 tablet (5 mg total) by mouth daily. Use daily for BPH   valACYclovir (VALTREX) 500 MG tablet TAKE 1 TABLET BY MOUTH EVERY DAY                       Objective:   Physical Exam  Wts  11/17/2021       198  04/16/2021     195  07/28/2020       203 07/30/2019         203  01/23/2019       201  09/01/2018         205  07/18/2018       200     02/28/2018          203   10/03/2017         204   09/05/2017       204 07/21/16 196 lb (88.9 kg)  06/29/16 197 lb 12.8 oz (89.7 kg)    Vital signs reviewed  11/17/2021  - Note at rest 02 sats  96% on RA   General appearance:    amb wm nad    HEENT :  Oropharynx  clear  Nasal turbintes nl    NECK :  without   appent JVD/ palpable Nodes/TM    LUNGS: no acc muscle use,  Nl contour chest which is clear to A and P bilaterally without cough on insp or exp maneuvers   CV:  RRR  no s3 or murmur or increase in P2, and no edema   ABD:  soft and nontender with nl inspiratory excursion in the supine position. No bruits or organomegaly appreciated   MS:  Nl gait/ ext warm without deformities Or obvious joint restrictions  calf tenderness, cyanosis or clubbing     SKIN: warm and dry without lesions    NEURO:  alert, approp, nl sensorium with  no motor or cerebellar deficits apparent.            Assessment:

## 2021-12-06 ENCOUNTER — Other Ambulatory Visit: Payer: Self-pay | Admitting: Family Medicine

## 2021-12-06 DIAGNOSIS — A609 Anogenital herpesviral infection, unspecified: Secondary | ICD-10-CM

## 2021-12-09 ENCOUNTER — Telehealth: Payer: Self-pay

## 2021-12-09 MED ORDER — TADALAFIL 5 MG PO TABS: 5.0000 mg | ORAL_TABLET | Freq: Every day | ORAL | 3 refills | Status: DC

## 2021-12-09 NOTE — Telephone Encounter (Signed)
Initiated:   [name] Key: [id number] - PA Case ID: [id number] - Rx #: [id number]

## 2021-12-09 NOTE — Telephone Encounter (Signed)
PA has been started

## 2021-12-11 NOTE — Telephone Encounter (Signed)
PA approved. Effective 06/28/21 to 12/10/2022.

## 2021-12-24 ENCOUNTER — Ambulatory Visit (HOSPITAL_BASED_OUTPATIENT_CLINIC_OR_DEPARTMENT_OTHER)
Admission: RE | Admit: 2021-12-24 | Discharge: 2021-12-24 | Disposition: A | Discharge status: Home / Self Care | Payer: Medicare Other | Source: Ambulatory Visit | Attending: Family Medicine | Admitting: Family Medicine

## 2021-12-24 DIAGNOSIS — M5432 Sciatica, left side: Secondary | ICD-10-CM | POA: Diagnosis not present

## 2021-12-24 DIAGNOSIS — M545 Low back pain, unspecified: Secondary | ICD-10-CM | POA: Diagnosis not present

## 2021-12-25 ENCOUNTER — Encounter: Payer: Self-pay | Admitting: Family Medicine

## 2021-12-25 DIAGNOSIS — M545 Low back pain, unspecified: Secondary | ICD-10-CM

## 2021-12-25 DIAGNOSIS — M5432 Sciatica, left side: Secondary | ICD-10-CM

## 2022-01-13 MED ORDER — CYCLOBENZAPRINE HCL 5 MG PO TABS: 5.0000 mg | ORAL_TABLET | Freq: Two times a day (BID) | ORAL | 1 refills | Status: DC | PRN

## 2022-01-13 NOTE — Addendum Note (Signed)
Addended by: Lamar Blinks C on: 01/13/2022 07:46 PM   Modules accepted: Orders

## 2022-01-13 NOTE — Addendum Note (Signed)
Addended by: Lamar Blinks C on: 01/13/2022 11:09 AM   Modules accepted: Orders

## 2022-01-14 MED ORDER — DICLOFENAC SODIUM 1 % EX GEL: 2.0000 g | Freq: Four times a day (QID) | CUTANEOUS | 3 refills | Status: DC

## 2022-01-14 MED ORDER — METHOCARBAMOL 500 MG PO TABS: 500.0000 mg | ORAL_TABLET | Freq: Three times a day (TID) | ORAL | 1 refills | Status: DC | PRN

## 2022-01-14 NOTE — Addendum Note (Signed)
Addended by: Darreld Mclean on: 01/14/2022 06:52 AM   Modules accepted: Orders

## 2022-01-18 ENCOUNTER — Ambulatory Visit
Admission: EM | Admit: 2022-01-18 | Discharge: 2022-01-18 | Discharge status: Absent without leave | Payer: Medicare Other

## 2022-01-18 ENCOUNTER — Ambulatory Visit
Admission: EM | Admit: 2022-01-18 | Discharge: 2022-01-18 | Disposition: A | Discharge status: Home / Self Care | Payer: Medicare Other | Attending: Emergency Medicine | Admitting: Emergency Medicine

## 2022-01-18 ENCOUNTER — Ambulatory Visit: Payer: Medicare Other | Attending: Family Medicine | Admitting: Physical Therapy

## 2022-01-18 DIAGNOSIS — R252 Cramp and spasm: Secondary | ICD-10-CM | POA: Insufficient documentation

## 2022-01-18 DIAGNOSIS — M5432 Sciatica, left side: Secondary | ICD-10-CM | POA: Diagnosis not present

## 2022-01-18 DIAGNOSIS — M6281 Muscle weakness (generalized): Secondary | ICD-10-CM | POA: Insufficient documentation

## 2022-01-18 DIAGNOSIS — M5442 Lumbago with sciatica, left side: Secondary | ICD-10-CM | POA: Insufficient documentation

## 2022-01-18 DIAGNOSIS — T63441A Toxic effect of venom of bees, accidental (unintentional), initial encounter: Secondary | ICD-10-CM

## 2022-01-18 DIAGNOSIS — M5441 Lumbago with sciatica, right side: Secondary | ICD-10-CM | POA: Insufficient documentation

## 2022-01-18 DIAGNOSIS — G8929 Other chronic pain: Secondary | ICD-10-CM | POA: Insufficient documentation

## 2022-01-18 MED ORDER — DIPHENHYDRAMINE HCL 25 MG PO CAPS
25.0000 mg | ORAL_CAPSULE | Freq: Once | ORAL | Status: AC
  Administered 2022-01-18: 25 mg via ORAL

## 2022-01-18 MED ORDER — DIPHENHYDRAMINE HCL 25 MG PO CAPS
25.0000 mg | ORAL_CAPSULE | Freq: Four times a day (QID) | ORAL | 0 refills | Status: DC | PRN
Start: 2022-01-18 — End: 2022-06-08

## 2022-01-18 NOTE — ED Provider Notes (Signed)
[facility]    CSN: [id number] Arrival date & time: 01/18/22  1546    HISTORY   Chief Complaint  Patient presents with   Insect Bite   HPI Joshua [name] is a pleasant, 71 y.o. male who presents to urgent care today. Patient c/o being stung by a bee around 130 this afternoon, states he cleaned the area with rubbing alcohol.  Patient complains of initial swelling and redness which has now resolved.  Patient states that he continues to have stabbing, burning pain in the area of the bite.  Patient denies history of anaphylactic reaction to bee stings.  The history is provided by the patient.   Past Medical History:  Diagnosis Date   Asthma    BPH (benign prostatic hyperplasia)    GERD (gastroesophageal reflux disease)    HLD (hyperlipidemia)    HSV infection    Hypertension    Kidney stones    Patient Active Problem List   Diagnosis Date Noted   Rhinitis 06/09/2020   Healthcare maintenance 06/09/2020   Vitreous floater, bilateral 05/15/2019   Nuclear sclerotic cataract of both eyes 05/15/2019   Precordial chest pain 04/06/2018   Dyspnea on exertion 04/06/2018   Dyslipidemia 04/06/2018   BPH with urinary obstruction 03/31/2018   Essential hypertension 01/17/2018   Solitary lung nodule 09/07/2017   Sinusitis, chronic/Cough 07/22/2016   Chronic asthma, mild persistent, uncomplicated 30/86/5784   Upper airway cough syndrome 06/29/2016   Past Surgical History:  Procedure Laterality Date   APPENDECTOMY     BREAST CYST EXCISION Left    benign   LITHOTRIPSY     TONSILLECTOMY      Home Medications    Prior to Admission medications   Medication Sig Start Date End Date Taking? Authorizing Provider  acyclovir ointment (ZOVIRAX) 5 % Apply 1 application topically every 3 (three) hours. Use for one week as needed for outbreak 03/22/18   Copland, Gay Filler, MD  albuterol (VENTOLIN HFA) 108 (90 Base) MCG/ACT inhaler INHALE 1 TO 2 PUFFS INTO THE LUNGS EVERY 6 HOURS AS  NEEDED FOR WHEEZING OR SHORTNESS OF BREATH 11/17/21   Tanda Rockers, MD  alfuzosin (UROXATRAL) 10 MG 24 hr tablet Take 1 tablet (10 mg total) by mouth daily. 02/06/18   Copland, Gay Filler, MD  atorvastatin (LIPITOR) 10 MG tablet TAKE 1 TABLET BY MOUTH DAILY 11/16/21   Copland, Gay Filler, MD  budesonide-formoterol (SYMBICORT) 80-4.5 MCG/ACT inhaler INHALE 2 PUFFS BY MOUTH FIRST THING IN THE MORNING THEN INHALE 2 PUFFS BY MOUTH 12 HOURS LATER 11/17/21   Tanda Rockers, MD  diclofenac Sodium (VOLTAREN) 1 % GEL Apply 2 g topically 4 (four) times daily. Use as needed for pain 01/14/22   Copland, Gay Filler, MD  losartan (COZAAR) 100 MG tablet TAKE 1 TABLET(100 MG) BY MOUTH DAILY 11/16/21   Copland, Gay Filler, MD  methocarbamol (ROBAXIN) 500 MG tablet Take 1 tablet (500 mg total) by mouth every 8 (eight) hours as needed for muscle spasms. 01/14/22   Copland, Gay Filler, MD  pantoprazole (PROTONIX) 20 MG tablet Take 1 tablet (20 mg total) by mouth daily. Can increase to twice daily if needed 09/28/21   Armbruster, Carlota Raspberry, MD  tadalafil (CIALIS) 5 MG tablet Take 1 tablet (5 mg total) by mouth daily. Use daily for BPH 12/09/21   Copland, Gay Filler, MD  valACYclovir (VALTREX) 500 MG tablet Take 1 tablet (500 mg total) by mouth daily. 12/07/21   Copland, Gay Filler, MD  Family History Family History  Problem Relation Age of Onset   Kidney Stones Mother    Alzheimer's disease Father    Colon cancer Paternal Grandfather 30   Stomach cancer Neg Hx    Esophageal cancer Neg Hx    Pancreatic cancer Neg Hx    Social History Social History   Tobacco Use   Smoking status: Former    Packs/day: 0.50    Years: 22.00    Total pack years: 11.00    Types: Cigarettes    Quit date: 06/28/1993    Years since quitting: 28.5   Smokeless tobacco: Never  Vaping Use   Vaping Use: Never used  Substance Use Topics   Alcohol use: Yes    Comment: occasionally   Drug use: No   Allergies   Patient has no known  allergies.  Review of Systems Review of Systems Pertinent findings revealed after performing a 14 point review of systems has been noted in the history of present illness.  Physical Exam Triage Vital Signs ED Triage Vitals  Enc Vitals Group     BP 04/24/21 0827 (!) 147/82     Pulse Rate 04/24/21 0827 72     Resp 04/24/21 0827 18     Temp 04/24/21 0827 98.3 F (36.8 C)     Temp Source 04/24/21 0827 Oral     SpO2 04/24/21 0827 98 %     Weight --      Height --      Head Circumference --      Peak Flow --      Pain Score 04/24/21 0826 5     Pain Loc --      Pain Edu? --      Excl. in [place]? --   No data found.  Updated Vital Signs BP 135/84 (BP Location: Left Arm)   Pulse 75   Temp 98.1 F (36.7 C) (Oral)   Resp 16   SpO2 94%   Physical Exam Vitals and nursing note reviewed.  Constitutional:      General: He is not in acute distress.    Appearance: Normal appearance. He is not ill-appearing.  HENT:     Head: Normocephalic and atraumatic.     Salivary Glands: Right salivary gland is not diffusely enlarged or tender. Left salivary gland is not diffusely enlarged or tender.     Right Ear: Tympanic membrane, ear canal and external ear normal. No drainage. No middle ear effusion. There is no impacted cerumen. Tympanic membrane is not erythematous or bulging.     Left Ear: Tympanic membrane, ear canal and external ear normal. No drainage.  No middle ear effusion. There is no impacted cerumen. Tympanic membrane is not erythematous or bulging.     Nose: Nose normal. No nasal deformity, septal deviation, mucosal edema, congestion or rhinorrhea.     Right Turbinates: Not enlarged, swollen or pale.     Left Turbinates: Not enlarged, swollen or pale.     Right Sinus: No maxillary sinus tenderness or frontal sinus tenderness.     Left Sinus: No maxillary sinus tenderness or frontal sinus tenderness.     Mouth/Throat:     Lips: Pink. No lesions.     Mouth: Mucous membranes are moist.  No oral lesions.     Pharynx: Oropharynx is clear. Uvula midline. No posterior oropharyngeal erythema or uvula swelling.     Tonsils: No tonsillar exudate. 0 on the right. 0 on the left.  Eyes:  General: Lids are normal.        Right eye: No discharge.        Left eye: No discharge.     Extraocular Movements: Extraocular movements intact.     Conjunctiva/sclera: Conjunctivae normal.     Right eye: Right conjunctiva is not injected.     Left eye: Left conjunctiva is not injected.  Neck:     Trachea: Trachea and phonation normal.  Cardiovascular:     Rate and Rhythm: Normal rate and regular rhythm.     Pulses: Normal pulses.     Heart sounds: Normal heart sounds. No murmur heard.    No friction rub. No gallop.  Pulmonary:     Effort: Pulmonary effort is normal. No accessory muscle usage, prolonged expiration or respiratory distress.     Breath sounds: Normal breath sounds. No stridor, decreased air movement or transmitted upper airway sounds. No decreased breath sounds, wheezing, rhonchi or rales.  Chest:     Chest wall: No tenderness.  Musculoskeletal:        General: Normal range of motion.     Cervical back: Normal range of motion and neck supple. Normal range of motion.  Lymphadenopathy:     Cervical: No cervical adenopathy.  Skin:    General: Skin is warm and dry.     Findings: Lesion (Barely perceptible 2 mm area of redness on the posterior aspect right upper arm) present. No erythema or rash.  Neurological:     General: No focal deficit present.     Mental Status: He is alert and oriented to person, place, and time.  Psychiatric:        Mood and Affect: Mood normal.        Behavior: Behavior normal.     Visual Acuity Right Eye Distance:   Left Eye Distance:   Bilateral Distance:    Right Eye Near:   Left Eye Near:    Bilateral Near:     [facility] Couse / Diagnostics / Procedures:     Radiology No results found.  Procedures Procedures (including critical care  time) EKG  Pending results:  Labs Reviewed - No data to display  Medications Ordered in UC: Medications  diphenhydrAMINE (BENADRYL) capsule 25 mg (has no administration in time range)    UC Diagnoses / Final Clinical Impressions(s)   I have reviewed the triage vital signs and the nursing notes.  Pertinent labs & imaging results that were available during my care of the patient were reviewed by me and considered in my medical decision making (see chart for details).    Final diagnoses:  Bee sting reaction, accidental or unintentional, initial encounter   Discussed with patient that bee sting venom can sometimes cause a delayed reaction at that I recommend that he take Benadryl 25 mg every 6 hours for the next 48 hours to help prevent this.  Return precautions advised.  ED Prescriptions     Medication Sig Dispense Auth. Provider   diphenhydrAMINE (BENADRYL) 25 mg capsule Take 1 capsule (25 mg total) by mouth every 6 (six) hours as needed. 30 capsule Lynden Oxford Scales, PA-C      PDMP not reviewed this encounter.  Pending results:  Labs Reviewed - No data to display  Discharge Instructions:   Discharge Instructions      Please read the enclosed information about bee stings, symptoms to be concerned about and self-care.  You are provided with a dose of Benadryl during your visit today and I  would like for you to continue taking this medication every 6 hours for the next 48 hours.  It is important that you set an alarm in the middle of the night so that you stay on the 6-hour schedule.  Even if you do not have a history of an acute reaction to bee stings, every bee sting should be taken seriously.  It is also important to keep in mind that and acute reaction to bee stings such as shortness of breath and throat closing can sometimes occur up to 72 hours after the sting most commonly 48 hours is sufficient for close monitoring.  If you begin to experience any difficulty with  breathing, you feel your throat becomes tight or you feel your tongue swelling, please call 911 immediately.  Thank you for visiting urgent care today.      Disposition Upon Discharge:  Condition: stable for discharge home  Patient presented with an acute illness with associated systemic symptoms and significant discomfort requiring urgent management. In my opinion, this is a condition that a prudent lay person (someone who possesses an average knowledge of health and medicine) may potentially expect to result in complications if not addressed urgently such as respiratory distress, impairment of bodily function or dysfunction of bodily organs.   Routine symptom specific, illness specific and/or disease specific instructions were discussed with the patient and/or caregiver at length.   As such, the patient has been evaluated and assessed, work-up was performed and treatment was provided in alignment with urgent care protocols and evidence based medicine.  Patient/parent/caregiver has been advised that the patient may require follow up for further testing and treatment if the symptoms continue in spite of treatment, as clinically indicated and appropriate.  Patient/parent/caregiver has been advised to return to the [facility] or PCP if no better; to PCP or the Emergency Department if new signs and symptoms develop, or if the current signs or symptoms continue to change or worsen for further workup, evaluation and treatment as clinically indicated and appropriate  The patient will follow up with their current PCP if and as advised. If the patient does not currently have a PCP we will assist them in obtaining one.   The patient may need specialty follow up if the symptoms continue, in spite of conservative treatment and management, for further workup, evaluation, consultation and treatment as clinically indicated and appropriate.   Patient/parent/caregiver verbalized understanding and agreement of plan  as discussed.  All questions were addressed during visit.  Please see discharge instructions below for further details of plan.  This office note has been dictated using Museum/gallery curator.  Unfortunately, this method of dictation can sometimes lead to typographical or grammatical errors.  I apologize for your inconvenience in advance if this occurs.  Please do not hesitate to reach out to me if clarification is needed.      Lynden Oxford Scales, [place] 01/19/22 [phone]

## 2022-01-18 NOTE — Therapy (Signed)
OUTPATIENT PHYSICAL THERAPY THORACOLUMBAR EVALUATION   Patient Name: Joshua [name] MRN: [id number] DOB:03-11-1951, 71 y.o., male Today's Date: 01/18/2022   PT End of Session - 01/18/22 1435     Visit Number 1    Number of Visits 12    Date for PT Re-Evaluation 03/01/22    Authorization Type Medicare    PT Start Time 1440    PT Stop Time 7425    PT Time Calculation (min) 50 min    Activity Tolerance Patient tolerated treatment well    Behavior During Therapy WFL for tasks assessed/performed             Past Medical History:  Diagnosis Date   Asthma    BPH (benign prostatic hyperplasia)    GERD (gastroesophageal reflux disease)    HLD (hyperlipidemia)    HSV infection    Hypertension    Kidney stones    Past Surgical History:  Procedure Laterality Date   APPENDECTOMY     BREAST CYST EXCISION Left    benign   LITHOTRIPSY     TONSILLECTOMY     Patient Active Problem List   Diagnosis Date Noted   Rhinitis 06/09/2020   Healthcare maintenance 06/09/2020   Vitreous floater, bilateral 05/15/2019   Nuclear sclerotic cataract of both eyes 05/15/2019   Precordial chest pain 04/06/2018   Dyspnea on exertion 04/06/2018   Dyslipidemia 04/06/2018   BPH with urinary obstruction 03/31/2018   Essential hypertension 01/17/2018   Solitary lung nodule 09/07/2017   Sinusitis, chronic/Cough 07/22/2016   Chronic asthma, mild persistent, uncomplicated 95/63/8756   Upper airway cough syndrome 06/29/2016    PCP: Darreld Mclean., MD  REFERRING PROVIDER: Darreld Mclean., MD  REFERRING DIAG: M54.32 (ICD-10-CM) - Sciatica, left side  Rationale for Evaluation and Treatment Rehabilitation  THERAPY DIAG:  Acute right-sided low back pain with right-sided sciatica  Chronic left-sided low back pain with left-sided sciatica  Muscle weakness (generalized)  Cramp and spasm  ONSET DATE: 2 weeks ago  SUBJECTIVE:                                                                                                                                                                                            SUBJECTIVE STATEMENT: Patient was originally referred for L sided low back pain, radiating down L thigh, starting 3-4 years ago.  In the interim between referrel to PT, he reports that he bent over to pick up some trash and developed acute low back pain on the right side, with pain radiating down to his R knee, mostly at night when he sleeps.  Pain is worse at  night especially with changing positions.  He tends to sleep on side or on stomach.      PERTINENT HISTORY:  HTN, asthma, dyspnea on exertion, precordial chest pain  PAIN:  Are you having pain? Yes: NPRS scale: 6/10 Pain location: R side low back radiating down R leg, best 4/10, worst 9/10 Pain description: stabbing Aggravating factors: moving, bending, rolling over and sitting up in bed, laying down  Relieving factors: changing position   PRECAUTIONS: None  WEIGHT BEARING RESTRICTIONS No  FALLS:  Has patient fallen in last 6 months? 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 Alignment of posterior margins of vertebral bodies is within normal limits. Degenerative changes are noted with anterior bony spurs and facet hypertrophy, more so at L4-L5 and L5-S1 levels. There is mild narrowing of disc space at L4-L5 level. There is interval progression of degenerative changes. IMPRESSION: No recent fracture is seen in the lumbar spine. Lumbar spondylosis, more so at L4-L5 and L5-S1 levels with interval  progression.   PATIENT SURVEYS:  Modified Oswestry 19/50 = 38% disability - moderate   SCREENING FOR RED FLAGS: Bowel or bladder incontinence: No Spinal tumors: No Cauda equina syndrome: No Compression fracture: No Abdominal aneurysm: No  COGNITION:  Overall cognitive status: Within functional limits for tasks assessed     SENSATION: Reports numbness in 2&3rd toes bil, intact to light touch all dermatomes.   MUSCLE LENGTH: Hamstrings: Right 80 deg; Left 80 deg Ely's: Right 90 deg; Left 90 deg  POSTURE: forward head  PALPATION: Pain L4/5, L SIJ, L piriformis, R QL, R lumbar multifidi L3-5  LUMBAR ROM:   Active  AROM  eval  Flexion To ankles, L side pain  Extension WNL, no pain  Right lateral flexion To knee, R side pain  Left lateral flexion To knee, L side pain  Right rotation WNL  Left rotation WNL   (Blank rows = not tested)  LOWER EXTREMITY ROM:     Passive  Right eval Left eval  Hip flexion WNL WNL  Hip extension    Hip internal rotation 15 15  Hip external rotation 35 45  Knee flexion    Knee extension 0 0   (Blank rows = not tested)  LOWER EXTREMITY MMT:    MMT Right eval Left eval  Hip flexion 5* 5  Hip extension 4* 4+  Hip abduction 5 5  Hip adduction 5 5*  Knee flexion 5 5  Knee extension 5 5  Ankle dorsiflexion 5 5  Ankle plantarflexion 5 5   (Blank rows = not tested) *pain  LUMBAR SPECIAL TESTS:  Straight leg raise test: Negative  FUNCTIONAL TESTS:  5 times sit to stand: 16 seconds without UE assist.   GAIT: Distance walked: 27' Assistive device utilized: None Level of assistance: Complete Independence Comments: no deviation or device.     TODAY'S TREATMENT  01/18/22 Therapeutic Exercise: to improve strength and mobility. URL: [email or web address] - Prone Press Up  -  10 reps - Prone Hip Extension  - 10 reps bil - Standing Lumbar Extension with Counter  10 reps Self Care: see patient  education   PATIENT  EDUCATION:  Education details: education on findings, POC including treatments, modalities, dry needling, initial HEP.  Also educated on log roll to get in/out of bed more easily without aggravating LBP.  Person educated: Patient Education method: Consulting civil engineer, Demonstration, Verbal cues, and Handouts Education comprehension: verbalized understanding and returned demonstration   HOME EXERCISE PROGRAM: Access Code: [email or web address]  ASSESSMENT:  CLINICAL IMPRESSION: Patient is a 71 y.o. male who was seen today for physical therapy evaluation and treatment for chronic L sided low back pain and sciatica, in addition also reports acute R sided low back pain and R sciatica.  Noted tenderness on L more in glutes and piriformis, and on R lumbar multifidi and quadratus lumborum.  Reassured that symptoms consistent with muscle strain on R causing inflammation and pain down R leg, will address both chronic and acute LBP.  He had no pain with extension, so started with mckenzie based exercises for prone and standing extension.  He would benefit from skilled physical therapy to decrease pain and improve QOL.     OBJECTIVE IMPAIRMENTS decreased endurance, decreased mobility, decreased ROM, decreased strength, hypomobility, increased muscle spasms, impaired flexibility, and pain.   ACTIVITY LIMITATIONS carrying, lifting, bending, sitting, standing, sleeping, and locomotion level  PARTICIPATION LIMITATIONS: cleaning, laundry, shopping, community activity, occupation, yard work, and exercise  PERSONAL FACTORS Time since onset of injury/illness/exacerbation and 3+ comorbidities: history chronic back pain, HTN, asthma, dyspnea on exertion, precordial chest pain   are also affecting patient's functional outcome.   REHAB POTENTIAL: Good  CLINICAL DECISION MAKING: Evolving/moderate complexity  EVALUATION COMPLEXITY: Moderate   GOALS: Goals reviewed with patient? Yes  SHORT TERM GOALS: Target date: 02/01/2022   Patient will be independent with initial HEP.  Baseline: given Goal status: INITIAL  2.  Patient will report centralization of radicular symptoms.  Baseline: numbness in 2nd and 3rd toes Goal status: INITIAL    LONG TERM GOALS: Target date: 03/01/2022    Patient will be independent with advanced/ongoing HEP to improve outcomes and carryover.  Baseline: needs advancement.  Goal status: INITIAL  2.  Patient will report 75% improvement in low back pain to improve QOL.  Baseline: 6/10 pain average Goal status: INITIAL  3.  Patient will demonstrate full pain free lumbar ROM to perform ADLs.   Baseline: see objective Goal status: INITIAL  4.  Patient will demonstrate improved functional strength as demonstrated by 5x STS <12 seconds . Baseline: 16 seconds Goal status: INITIAL  5.  Patient will report MCID on lumbar FOTO to demonstrate improved functional ability.  Baseline: TBA Goal status: INITIAL   6.  Patient will tolerate 20 min of walking to perform exercise without increased LBP. Baseline: limited due to pain Goal status: INITIAL  7.  Patient will report 75% improvement in sleep disruption due to pain.  Baseline: sleep limited due to pain Goal status: INITIAL    PLAN: PT FREQUENCY: 1-2x/week  PT DURATION: 6 weeks  PLANNED INTERVENTIONS: Therapeutic exercises, Therapeutic activity, Neuromuscular re-education, Balance training, Gait training, Patient/Family education, Self Care, Joint mobilization, Dry Needling, Electrical stimulation, Spinal mobilization, Cryotherapy, Moist heat, Traction, Ultrasound, Manual therapy, and Re-evaluation.  PLAN FOR NEXT SESSION: FOTO for lumbar, review and progress HEP (started with extension, needs hip ROM and core strengthening), manual therapy, modalities PRN.   Rennie Natter, PT, DPT 01/18/2022, 6:15 PM

## 2022-01-18 NOTE — Discharge Instructions (Signed)
Please read the enclosed information about bee stings, symptoms to be concerned about and self-care.  You are provided with a dose of Benadryl during your visit today and I would like for you to continue taking this medication every 6 hours for the next 48 hours.  It is important that you set an alarm in the middle of the night so that you stay on the 6-hour schedule.  Even if you do not have a history of an acute reaction to bee stings, every bee sting should be taken seriously.  It is also important to keep in mind that and acute reaction to bee stings such as shortness of breath and throat closing can sometimes occur up to 72 hours after the sting most commonly 48 hours is sufficient for close monitoring.  If you begin to experience any difficulty with breathing, you feel your throat becomes tight or you feel your tongue swelling, please call 911 immediately.  Thank you for visiting urgent care today.

## 2022-01-18 NOTE — ED Triage Notes (Signed)
The patient c/o right arm pain, he states he believes he got stung by a bee.   Started: today

## 2022-01-25 ENCOUNTER — Ambulatory Visit: Payer: Medicare Other

## 2022-01-25 DIAGNOSIS — M6281 Muscle weakness (generalized): Secondary | ICD-10-CM

## 2022-01-25 DIAGNOSIS — M5442 Lumbago with sciatica, left side: Secondary | ICD-10-CM | POA: Diagnosis not present

## 2022-01-25 DIAGNOSIS — M5441 Lumbago with sciatica, right side: Secondary | ICD-10-CM | POA: Diagnosis not present

## 2022-01-25 DIAGNOSIS — M5432 Sciatica, left side: Secondary | ICD-10-CM | POA: Diagnosis not present

## 2022-01-25 DIAGNOSIS — G8929 Other chronic pain: Secondary | ICD-10-CM

## 2022-01-25 DIAGNOSIS — R252 Cramp and spasm: Secondary | ICD-10-CM | POA: Diagnosis not present

## 2022-01-25 NOTE — Therapy (Addendum)
OUTPATIENT PHYSICAL THERAPY TREATMENT   Patient Name: Joshua [name] MRN: [id number] DOB:[birth year], 71 y.o., male Today's Date: 01/25/2022   PT End of Session - 01/25/22 1451     Visit Number 2    Number of Visits 12    Date for PT Re-Evaluation 03/01/22    Authorization Type Medicare    PT Start Time 4656    PT Stop Time 8127    PT Time Calculation (min) 43 min    Activity Tolerance Patient tolerated treatment well    Behavior During Therapy WFL for tasks assessed/performed              Past Medical History:  Diagnosis Date   Asthma    BPH (benign prostatic hyperplasia)    GERD (gastroesophageal reflux disease)    HLD (hyperlipidemia)    HSV infection    Hypertension    Kidney stones    Past Surgical History:  Procedure Laterality Date   APPENDECTOMY     BREAST CYST EXCISION Left    benign   LITHOTRIPSY     TONSILLECTOMY     Patient Active Problem List   Diagnosis Date Noted   Rhinitis 06/09/2020   Healthcare maintenance 06/09/2020   Vitreous floater, bilateral 05/15/2019   Nuclear sclerotic cataract of both eyes 05/15/2019   Precordial chest pain 04/06/2018   Dyspnea on exertion 04/06/2018   Dyslipidemia 04/06/2018   BPH with urinary obstruction 03/31/2018   Essential hypertension 01/17/2018   Solitary lung nodule 09/07/2017   Sinusitis, chronic/Cough 07/22/2016   Chronic asthma, mild persistent, uncomplicated 51/70/0174   Upper airway cough syndrome 06/29/2016    PCP: Darreld Mclean., MD  REFERRING PROVIDER: Darreld Mclean., MD  REFERRING DIAG: [phone] (ICD-10-CM) - Sciatica, left side  Rationale for Evaluation and Treatment Rehabilitation  THERAPY DIAG:  Acute right-sided low back pain with right-sided sciatica  Chronic left-sided low back pain with left-sided sciatica  Muscle weakness (generalized)  Cramp and spasm  ONSET DATE: 2 weeks ago  SUBJECTIVE:                                                                                                                                                                                            SUBJECTIVE STATEMENT: Reports pain most notably when getting out of bed or making twisting movements     PERTINENT HISTORY:  HTN, asthma, dyspnea on exertion, precordial chest pain  PAIN:  Are you having pain? No  not at the moment- 6-7/10 when getting out of bed or twisting   PRECAUTIONS: None  WEIGHT BEARING RESTRICTIONS No  FALLS:  Has patient fallen in last  6 months? 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 Alignment of posterior margins of vertebral bodies is within normal limits. Degenerative changes are noted with anterior bony spurs and facet hypertrophy, more so at L4-L5 and L5-S1 levels. There is mild narrowing of disc space at L4-L5 level. There is interval progression of degenerative changes. IMPRESSION: No recent fracture is seen in the lumbar spine. Lumbar spondylosis, more so at L4-L5 and L5-S1 levels with interval progression.   PATIENT SURVEYS:  Modified Oswestry 19/50 = 38% disability - moderate   SCREENING FOR RED FLAGS: Bowel or bladder incontinence: No Spinal tumors: No Cauda equina syndrome: No Compression fracture: No Abdominal aneurysm: No  COGNITION:  Overall cognitive status: Within functional limits for tasks assessed     SENSATION: Reports numbness in 2&3rd toes bil, intact to light touch all dermatomes.   MUSCLE LENGTH: Hamstrings: Right 80 deg; Left 80 deg Ely's: Right 90 deg; Left 90 deg  POSTURE: forward  head  PALPATION: Pain L4/5, L SIJ, L piriformis, R QL, R lumbar multifidi L3-5  LUMBAR ROM:   Active  AROM  eval  Flexion To ankles, L side pain  Extension WNL, no pain  Right lateral flexion To knee, R side pain  Left lateral flexion To knee, L side pain  Right rotation WNL  Left rotation WNL   (Blank rows = not tested)  LOWER EXTREMITY ROM:     Passive  Right eval Left eval  Hip flexion WNL WNL  Hip extension    Hip internal rotation 15 15  Hip external rotation 35 45  Knee flexion    Knee extension 0 0   (Blank rows = not tested)  LOWER EXTREMITY MMT:    MMT Right eval Left eval  Hip flexion 5* 5  Hip extension 4* 4+  Hip abduction 5 5  Hip adduction 5 5*  Knee flexion 5 5  Knee extension 5 5  Ankle dorsiflexion 5 5  Ankle plantarflexion 5 5   (Blank rows = not tested) *pain  LUMBAR SPECIAL TESTS:  Straight leg raise test: Negative  FUNCTIONAL TESTS:  5 times sit to stand: 16 seconds without UE assist.   GAIT: Distance walked: 43' Assistive device utilized: None Level of assistance: Complete Independence Comments: no deviation or device.     TODAY'S TREATMENT  01/25/22 Therapeutic Exercise: Bike L1x58mn FOTO scored 41% Extension with arms supported on wall ( substitute for prone extension) Reviewed prone hip extension Supine KTOS and figure 4 piriformis stretch x 30 sec each leg LTR x 10 each way Supine PPT 10x3" - tactile cues Supine bridge with trA brace x 10 Supine clams x 10  Reviewed standing ext at counter Standing hip ext at counter x 10 01/18/22 Therapeutic Exercise: to improve strength and mobility. URL: [email or web address] - Prone Press Up  -  10 reps - Prone Hip Extension  - 10 reps bil - Standing Lumbar Extension with Counter  10 reps Self Care: see patient education   PATIENT EDUCATION:  Education details: education on findings, POC including treatments, modalities, dry needling, initial HEP.  Also educated  on  log roll to get in/out of bed more easily without aggravating LBP.  Person educated: Patient Education method: Explanation, Demonstration, Verbal cues, and Handouts Education comprehension: verbalized understanding and returned demonstration   HOME EXERCISE PROGRAM: Access Code: [email or web address] URL: [email or web address] Date: 01/25/2022 Prepared by: Clarene Essex  Exercises - Prone Hip Extension  - 1 x daily - 7 x weekly - 2 sets - 10 reps - Supine Lower Trunk Rotation  - 1 x daily - 7 x weekly - 3 sets - 10 reps - 5 sec hold - Supine Figure 4 Piriformis Stretch  - 1 x daily - 7 x weekly - 3 sets - 3 reps - 30 sec hold - Supine Piriformis Stretch with Foot on Ground  - 1 x daily - 7 x weekly - 3 sets - 3 reps - 30 sec hold - Standing Lumbar Extension with Counter  - 3 x daily - 7 x weekly - 1 sets - 10 reps  ASSESSMENT:  CLINICAL IMPRESSION: Progressed HEP, providing modification for patient's comfort. Advanced with hip exercises focusing on stretching and mobility. Cues throughout to avoid painful ROM with exercises. He would need more focus on neutral spine core stab exercises as he tends to have increased lumbar lordosis.   OBJECTIVE IMPAIRMENTS decreased endurance, decreased mobility, decreased ROM, decreased strength, hypomobility, increased muscle spasms, impaired flexibility, and pain.   ACTIVITY LIMITATIONS carrying, lifting, bending, sitting, standing, sleeping, and locomotion level  PARTICIPATION LIMITATIONS: cleaning, laundry, shopping, community activity, occupation, yard work, and exercise  PERSONAL FACTORS Time since onset of injury/illness/exacerbation and 3+ comorbidities: history chronic back pain, HTN, asthma, dyspnea on exertion, precordial chest pain   are also affecting patient's functional outcome.   REHAB POTENTIAL: Good  CLINICAL DECISION MAKING: Evolving/moderate complexity  EVALUATION COMPLEXITY: Moderate   GOALS: Goals reviewed with  patient? Yes  SHORT TERM GOALS: Target date: 02/01/2022  Patient will be independent with initial HEP.  Baseline: given Goal status: INITIAL  2.  Patient will report centralization of radicular symptoms.  Baseline: numbness in 2nd and 3rd toes Goal status: INITIAL    LONG TERM GOALS: Target date: 03/01/2022    Patient will be independent with advanced/ongoing HEP to improve outcomes and carryover.  Baseline: needs advancement.  Goal status: INITIAL  2.  Patient will report 75% improvement in low back pain to improve QOL.  Baseline: 6/10 pain average Goal status: INITIAL  3.  Patient will demonstrate full pain free lumbar ROM to perform ADLs.   Baseline: see objective Goal status: INITIAL  4.  Patient will demonstrate improved functional strength as demonstrated by 5x STS <12 seconds . Baseline: 16 seconds Goal status: INITIAL  5.  Patient will report MCID on lumbar FOTO to demonstrate improved functional ability.  Baseline: TBA Goal status: INITIAL   6.  Patient will tolerate 20 min of walking to perform exercise without increased LBP. Baseline: limited due to pain Goal status: INITIAL  7.  Patient will report 75% improvement in sleep disruption due to pain.  Baseline: sleep limited due to pain Goal status: INITIAL    PLAN: PT FREQUENCY: 1-2x/week  PT DURATION: 6 weeks  PLANNED INTERVENTIONS: Therapeutic exercises, Therapeutic activity, Neuromuscular re-education, Balance training, Gait training, Patient/Family education, Self Care, Joint mobilization, Dry Needling, Electrical stimulation, Spinal mobilization, Cryotherapy, Moist heat, Traction, Ultrasound, Manual therapy, and Re-evaluation.  PLAN FOR NEXT SESSION: continue core exercises with emphasis on core stability, manual therapy, modalities PRN.   Artist Pais, PTA 01/25/2022, 2:52 PM

## 2022-01-27 ENCOUNTER — Ambulatory Visit: Payer: Medicare Other | Attending: Family Medicine | Admitting: Physical Therapy

## 2022-01-27 ENCOUNTER — Encounter: Payer: Self-pay | Admitting: Physical Therapy

## 2022-01-27 DIAGNOSIS — M6281 Muscle weakness (generalized): Secondary | ICD-10-CM | POA: Insufficient documentation

## 2022-01-27 DIAGNOSIS — G8929 Other chronic pain: Secondary | ICD-10-CM | POA: Diagnosis not present

## 2022-01-27 DIAGNOSIS — M5441 Lumbago with sciatica, right side: Secondary | ICD-10-CM | POA: Diagnosis not present

## 2022-01-27 DIAGNOSIS — M5442 Lumbago with sciatica, left side: Secondary | ICD-10-CM | POA: Diagnosis not present

## 2022-01-27 DIAGNOSIS — R252 Cramp and spasm: Secondary | ICD-10-CM | POA: Insufficient documentation

## 2022-01-27 NOTE — Therapy (Signed)
OUTPATIENT PHYSICAL THERAPY TREATMENT   Patient Name: Joshua [name] MRN: [id number] DOB:03-04-1951, 71 y.o., male Today's Date: 01/27/2022   PT End of Session - 01/27/22 1410     Visit Number 3    Number of Visits 12    Date for PT Re-Evaluation 03/01/22    Authorization Type Medicare    PT Start Time 1408    PT Stop Time 1450    PT Time Calculation (min) 42 min    Activity Tolerance Patient tolerated treatment well    Behavior During Therapy WFL for tasks assessed/performed              Past Medical History:  Diagnosis Date   Asthma    BPH (benign prostatic hyperplasia)    GERD (gastroesophageal reflux disease)    HLD (hyperlipidemia)    HSV infection    Hypertension    Kidney stones    Past Surgical History:  Procedure Laterality Date   APPENDECTOMY     BREAST CYST EXCISION Left    benign   LITHOTRIPSY     TONSILLECTOMY     Patient Active Problem List   Diagnosis Date Noted   Rhinitis 06/09/2020   Healthcare maintenance 06/09/2020   Vitreous floater, bilateral 05/15/2019   Nuclear sclerotic cataract of both eyes 05/15/2019   Precordial chest pain 04/06/2018   Dyspnea on exertion 04/06/2018   Dyslipidemia 04/06/2018   BPH with urinary obstruction 03/31/2018   Essential hypertension 01/17/2018   Solitary lung nodule 09/07/2017   Sinusitis, chronic/Cough 07/22/2016   Chronic asthma, mild persistent, uncomplicated 65/46/5035   Upper airway cough syndrome 06/29/2016    PCP: Darreld Mclean., MD  REFERRING PROVIDER: Darreld Mclean., MD  REFERRING DIAG: M54.32 (ICD-10-CM) - Sciatica, left side  Rationale for Evaluation and Treatment Rehabilitation  THERAPY DIAG:  Acute right-sided low back pain with right-sided sciatica  Chronic left-sided low back pain with left-sided sciatica  Muscle weakness (generalized)  Cramp and spasm  ONSET DATE: 2 weeks ago  SUBJECTIVE:                                                                                                                                                                                            SUBJECTIVE STATEMENT: Reports some of exercises are hard, but feels they are good.  Still having the pain getting out of bed or bending over.  [place] if does slowly.  "I though about it, I don't want to do dry needling now."     PERTINENT HISTORY:  HTN, asthma, dyspnea on exertion, precordial chest pain  PAIN:  Are you having pain? Yes: NPRS scale:  6-7/10 Pain location: L hip Pain description: sore, aching     PRECAUTIONS: None  WEIGHT BEARING RESTRICTIONS No  FALLS:  Has patient fallen in last 6 months? 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 Alignment of posterior margins of vertebral bodies is within normal limits. Degenerative changes are noted with anterior bony spurs and facet hypertrophy, more so at L4-L5 and L5-S1 levels. There is mild narrowing of disc space at L4-L5 level. There is interval progression of degenerative changes. IMPRESSION: No recent fracture is seen in the lumbar spine. Lumbar spondylosis, more so at L4-L5 and L5-S1 levels with interval progression.   PATIENT SURVEYS:  Modified Oswestry 19/50 = 38% disability - moderate   SCREENING FOR RED FLAGS: Bowel or bladder incontinence: No Spinal tumors: No Cauda equina syndrome: No Compression fracture: No Abdominal aneurysm: No  COGNITION:  Overall cognitive status: Within functional limits for tasks assessed     SENSATION: Reports numbness in 2&3rd toes bil, intact to light touch all  dermatomes.   MUSCLE LENGTH: Hamstrings: Right 80 deg; Left 80 deg Ely's: Right 90 deg; Left 90 deg  POSTURE: forward head  PALPATION: Pain L4/5, L SIJ, L piriformis, R QL, R lumbar multifidi L3-5  LUMBAR ROM:   Active  AROM  eval  Flexion To ankles, L side pain  Extension WNL, no pain  Right lateral flexion To knee, R side pain  Left lateral flexion To knee, L side pain  Right rotation WNL  Left rotation WNL   (Blank rows = not tested)  LOWER EXTREMITY ROM:     Passive  Right eval Left eval  Hip flexion WNL WNL  Hip extension    Hip internal rotation 15 15  Hip external rotation 35 45  Knee flexion    Knee extension 0 0   (Blank rows = not tested)  LOWER EXTREMITY MMT:    MMT Right eval Left eval  Hip flexion 5* 5  Hip extension 4* 4+  Hip abduction 5 5  Hip adduction 5 5*  Knee flexion 5 5  Knee extension 5 5  Ankle dorsiflexion 5 5  Ankle plantarflexion 5 5   (Blank rows = not tested) *pain  LUMBAR SPECIAL TESTS:  Straight leg raise test: Negative  FUNCTIONAL TESTS:  5 times sit to stand: 16 seconds without UE assist.   GAIT: Distance walked: 73' Assistive device utilized: None Level of assistance: Complete Independence Comments: no deviation or device.     TODAY'S TREATMENT  01/27/2022 Therapeutic Exercise: to improve strength and mobility.  Demo, verbal and tactile cues throughout for technique. Nustep L5 x 6 min  Side glides - 2 x 10 bil - preference for right arm on wall.  Back extension x 20 on wall In supine: LTR x 10  PPT x 10 Bridges 2 x 10 with TrA - unable to clear buttocks SKTC stretch 2 x 30 sec bil  KTOS stretch  Windshield wiper active stretch for IR/ER hip x 10  Manual Therapy: to decrease muscle spasm and pain and improve mobility.  IASTM with foam roller to glutes and proximal hamstrings. STM/TPR  to R QL, R lumbar paraspinals, R UPA mobs to L4/5, cross friction to R proximal hamstring attachment.   01/25/22 Therapeutic  Exercise: Bike L1x66mn FOTO scored 41% Extension with arms supported on wall ( substitute for prone extension) Reviewed prone hip extension Supine KTOS and figure 4 piriformis stretch x 30 sec each leg LTR x 10 each way Supine PPT 10x3" - tactile cues Supine bridge with trA brace x 10 Supine clams x 10  Reviewed standing ext at counter Standing hip ext at counter x 10  01/18/22 Therapeutic Exercise: to improve strength and mobility. - Prone Press Up  -  10 reps - Prone Hip Extension  - 10 reps bil - Standing Lumbar Extension with Counter  10 reps Self Care: see patient education   PATIENT EDUCATION:  Education details: progressed HEP 01/27/22 Person educated: Patient Education method: Explanation, Demonstration, Verbal cues, and Handouts Education comprehension: verbalized understanding and returned demonstration   HOME EXERCISE PROGRAM: Access Code: [email or web address] ASSESSMENT:  CLINICAL IMPRESSION: Pt. Reports improving low back pain.  Today added side glides and standing lumbar extension to HEP, needed education throughout to not push through pain.  With supine exercises he had difficulty with R hamstring cramping, and noted more tenderness in R proximal hamstring with manual therapy.  After interventions today was able to stand up without low back pain.  TCavan Beardencontinues to demonstrate potential for improvement and would benefit from continued skilled therapy to address impairments.    OBJECTIVE IMPAIRMENTS decreased endurance, decreased mobility, decreased ROM, decreased strength, hypomobility, increased muscle spasms, impaired flexibility, and pain.   ACTIVITY LIMITATIONS carrying, lifting, bending, sitting, standing, sleeping, and locomotion level  PARTICIPATION LIMITATIONS: cleaning, laundry, shopping, community activity, occupation, yard work, and exercise  PERSONAL FACTORS Time since onset of injury/illness/exacerbation and 3+ comorbidities: history chronic back pain,  HTN, asthma, dyspnea on exertion, precordial chest pain   are also affecting patient's functional outcome.   REHAB POTENTIAL: Good  CLINICAL DECISION MAKING: Evolving/moderate complexity  EVALUATION COMPLEXITY: Moderate   GOALS: Goals reviewed with patient? Yes  SHORT TERM GOALS: Target date: 02/01/2022  Patient will be independent with initial HEP.  Baseline: given Goal status: MET  01/27/22- reports good compliance.   2.  Patient will report centralization of radicular symptoms.  Baseline: numbness in 2nd and 3rd toes Goal status: IN PROGRESS 01/27/22- intermittent tingling in toes now    LONG TERM GOALS: Target date: 03/01/2022    Patient will be independent with advanced/ongoing HEP to improve outcomes and carryover.  Baseline: needs advancement.  Goal status: IN PROGRESS  2.  Patient will report 75% improvement in low back pain to improve QOL.  Baseline: 6/10 pain average Goal status: IN PROGRESS  3.  Patient will demonstrate full pain free lumbar ROM to perform ADLs.   Baseline: see objective Goal status: IN PROGRESS  4.  Patient will demonstrate improved functional strength as demonstrated by 5x STS <12 seconds . Baseline: 16 seconds Goal status: IN PROGRESS  5.  Patient will report MCID on lumbar FOTO to demonstrate improved functional ability.  Baseline: TBA Goal status: IN PROGRESS   6.  Patient will tolerate 20 min of walking to perform exercise without increased LBP. Baseline: limited due to pain Goal status: IN PROGRESS  7.  Patient will report 75% improvement in sleep disruption due to pain.  Baseline: sleep limited due to pain Goal status: IN PROGRESS    PLAN: PT FREQUENCY: 1-2x/week  PT DURATION: 6 weeks  PLANNED  INTERVENTIONS: Therapeutic exercises, Therapeutic activity, Neuromuscular re-education, Balance training, Gait training, Patient/Family education, Self Care, Joint mobilization, Dry Needling, Electrical stimulation, Spinal mobilization,  Cryotherapy, Moist heat, Traction, Ultrasound, Manual therapy, and Re-evaluation.  PLAN FOR NEXT SESSION: continue core exercises with emphasis on core stability, manual therapy, modalities PRN.   Rennie Natter, PT, DPT  01/27/2022, 6:24 PM

## 2022-02-02 ENCOUNTER — Ambulatory Visit: Payer: Medicare Other

## 2022-02-02 DIAGNOSIS — M6281 Muscle weakness (generalized): Secondary | ICD-10-CM

## 2022-02-02 DIAGNOSIS — R252 Cramp and spasm: Secondary | ICD-10-CM | POA: Diagnosis not present

## 2022-02-02 DIAGNOSIS — M5442 Lumbago with sciatica, left side: Secondary | ICD-10-CM | POA: Diagnosis not present

## 2022-02-02 DIAGNOSIS — M5441 Lumbago with sciatica, right side: Secondary | ICD-10-CM | POA: Diagnosis not present

## 2022-02-02 DIAGNOSIS — G8929 Other chronic pain: Secondary | ICD-10-CM

## 2022-02-02 NOTE — Therapy (Signed)
OUTPATIENT PHYSICAL THERAPY TREATMENT   Patient Name: Joshua [name] MRN: [id number] DOB:[birth year], 71 y.o., male Today's Date: 02/02/2022   PT End of Session - 02/02/22 1446     Visit Number 4    Number of Visits 12    Date for PT Re-Evaluation 03/01/22    Authorization Type Medicare    PT Start Time 2993    PT Stop Time 7169    PT Time Calculation (min) 42 min    Activity Tolerance Patient tolerated treatment well    Behavior During Therapy WFL for tasks assessed/performed               Past Medical History:  Diagnosis Date   Asthma    BPH (benign prostatic hyperplasia)    GERD (gastroesophageal reflux disease)    HLD (hyperlipidemia)    HSV infection    Hypertension    Kidney stones    Past Surgical History:  Procedure Laterality Date   APPENDECTOMY     BREAST CYST EXCISION Left    benign   LITHOTRIPSY     TONSILLECTOMY     Patient Active Problem List   Diagnosis Date Noted   Rhinitis 06/09/2020   Healthcare maintenance 06/09/2020   Vitreous floater, bilateral 05/15/2019   Nuclear sclerotic cataract of both eyes 05/15/2019   Precordial chest pain 04/06/2018   Dyspnea on exertion 04/06/2018   Dyslipidemia 04/06/2018   BPH with urinary obstruction 03/31/2018   Essential hypertension 01/17/2018   Solitary lung nodule 09/07/2017   Sinusitis, chronic/Cough 07/22/2016   Chronic asthma, mild persistent, uncomplicated 67/89/3810   Upper airway cough syndrome 06/29/2016    PCP: Darreld Mclean., MD  REFERRING PROVIDER: Darreld Mclean., MD  REFERRING DIAG: M54.32 (ICD-10-CM) - Sciatica, left side  Rationale for Evaluation and Treatment Rehabilitation  THERAPY DIAG:  Acute right-sided low back pain with right-sided sciatica  Chronic left-sided low back pain with left-sided sciatica  Muscle weakness (generalized)  Cramp and spasm  ONSET DATE: 2 weeks ago  SUBJECTIVE:                                                                                                                                                                                            SUBJECTIVE STATEMENT: Drove over the weekend for a long time, noticing now that his pain is less intense and more generalized. Still getting pain down leg.     PERTINENT HISTORY:  HTN, asthma, dyspnea on exertion, precordial chest pain  PAIN:  Are you having pain? Yes: NPRS scale: 5/10 Pain location: across low back Pain description: sore, aching     PRECAUTIONS: None  WEIGHT BEARING RESTRICTIONS No  FALLS:  Has patient fallen in last 6 months? 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 Alignment of posterior margins of vertebral bodies is within normal limits. Degenerative changes are noted with anterior bony spurs and facet hypertrophy, more so at L4-L5 and L5-S1 levels. There is mild narrowing of disc space at L4-L5 level. There is interval progression of degenerative changes. IMPRESSION: No recent fracture is seen in the lumbar spine. Lumbar spondylosis, more so at L4-L5 and L5-S1 levels with interval progression.   PATIENT SURVEYS:  Modified Oswestry 19/50 = 38% disability - moderate   SCREENING FOR RED FLAGS: Bowel or bladder incontinence: No Spinal tumors: No Cauda equina syndrome: No Compression fracture: No Abdominal aneurysm: No  COGNITION:  Overall cognitive status: Within functional limits for tasks assessed     SENSATION: Reports numbness in 2&3rd toes bil, intact to light touch all dermatomes.   MUSCLE LENGTH: Hamstrings: Right 80 deg;  Left 80 deg Ely's: Right 90 deg; Left 90 deg  POSTURE: forward head  PALPATION: Pain L4/5, L SIJ, L piriformis, R QL, R lumbar multifidi L3-5  LUMBAR ROM:   Active  AROM  eval  Flexion To ankles, L side pain  Extension WNL, no pain  Right lateral flexion To knee, R side pain  Left lateral flexion To knee, L side pain  Right rotation WNL  Left rotation WNL   (Blank rows = not tested)  LOWER EXTREMITY ROM:     Passive  Right eval Left eval  Hip flexion WNL WNL  Hip extension    Hip internal rotation 15 15  Hip external rotation 35 45  Knee flexion    Knee extension 0 0   (Blank rows = not tested)  LOWER EXTREMITY MMT:    MMT Right eval Left eval  Hip flexion 5* 5  Hip extension 4* 4+  Hip abduction 5 5  Hip adduction 5 5*  Knee flexion 5 5  Knee extension 5 5  Ankle dorsiflexion 5 5  Ankle plantarflexion 5 5   (Blank rows = not tested) *pain  LUMBAR SPECIAL TESTS:  Straight leg raise test: Negative  FUNCTIONAL TESTS:  5 times sit to stand: 16 seconds without UE assist.   GAIT: Distance walked: 62' Assistive device utilized: None Level of assistance: Complete Independence Comments: no deviation or device.     TODAY'S TREATMENT  02/02/22 Therapeutic Exercise: UBE L1 6 min fwd Supine KTOS + figure 4 stretch x 30 sec hold SKTC x 30 sec hold bil Bridges 2x10 - unable to clear table Manual Therapy: STM to R glute med, proximal hamstrings, R QL, lumbar PS  01/27/2022 Therapeutic Exercise: to improve strength and mobility.  Demo, verbal and tactile cues throughout for technique. Nustep L5 x 6 min  Side glides - 2 x 10 bil - preference for right arm on wall.  Back extension x 20 on wall In supine: LTR x 10  PPT x 10 Bridges 2 x 10 with TrA - unable to clear buttocks SKTC stretch 2 x 30 sec bil  KTOS stretch  Windshield wiper active  stretch for IR/ER hip x 10  Manual Therapy: to decrease muscle spasm and pain and improve mobility.  IASTM with foam  roller to glutes and proximal hamstrings. STM/TPR to R QL, R lumbar paraspinals, R UPA mobs to L4/5, cross friction to R proximal hamstring attachment.   01/25/22 Therapeutic Exercise: Bike L1x5mn FOTO scored 41% Extension with arms supported on wall ( substitute for prone extension) Reviewed prone hip extension Supine KTOS and figure 4 piriformis stretch x 30 sec each leg LTR x 10 each way Supine PPT 10x3" - tactile cues Supine bridge with trA brace x 10 Supine clams x 10  Reviewed standing ext at counter Standing hip ext at counter x 10     PATIENT EDUCATION:  Education details: progressed HEP 01/27/22 Person educated: Patient Education method: EConsulting civil engineer Demonstration, Verbal cues, and Handouts Education comprehension: verbalized understanding and returned demonstration   HOME EXERCISE PROGRAM: Access Code: [email or web address] ASSESSMENT:  CLINICAL IMPRESSION: Pt tolerated the progression of exercises well. Still unable to clear bottom from mat with bridges. He continues to be tight in both figure 4 and KTOS position. TTP and tightness found mostly in glute med and proximal HS. Pt overall showed a good response to treatment.  OBJECTIVE IMPAIRMENTS decreased endurance, decreased mobility, decreased ROM, decreased strength, hypomobility, increased muscle spasms, impaired flexibility, and pain.   ACTIVITY LIMITATIONS carrying, lifting, bending, sitting, standing, sleeping, and locomotion level  PARTICIPATION LIMITATIONS: cleaning, laundry, shopping, community activity, occupation, yard work, and exercise  PERSONAL FACTORS Time since onset of injury/illness/exacerbation and 3+ comorbidities: history chronic back pain, HTN, asthma, dyspnea on exertion, precordial chest pain   are also affecting patient's functional outcome.   REHAB POTENTIAL: Good  CLINICAL DECISION MAKING: Evolving/moderate complexity  EVALUATION COMPLEXITY: Moderate   GOALS: Goals reviewed with patient?  Yes  SHORT TERM GOALS: Target date: 02/01/2022  Patient will be independent with initial HEP.  Baseline: given Goal status: MET  01/27/22- reports good compliance.   2.  Patient will report centralization of radicular symptoms.  Baseline: numbness in 2nd and 3rd toes Goal status: IN PROGRESS 01/27/22- intermittent tingling in toes now    LONG TERM GOALS: Target date: 03/01/2022    Patient will be independent with advanced/ongoing HEP to improve outcomes and carryover.  Baseline: needs advancement.  Goal status: IN PROGRESS  2.  Patient will report 75% improvement in low back pain to improve QOL.  Baseline: 6/10 pain average Goal status: IN PROGRESS  3.  Patient will demonstrate full pain free lumbar ROM to perform ADLs.   Baseline: see objective Goal status: IN PROGRESS  4.  Patient will demonstrate improved functional strength as demonstrated by 5x STS <12 seconds . Baseline: 16 seconds Goal status: IN PROGRESS  5.  Patient will report MCID on lumbar FOTO to demonstrate improved functional ability.  Baseline: TBA Goal status: IN PROGRESS   6.  Patient will tolerate 20 min of walking to perform exercise without increased LBP. Baseline: limited due to pain Goal status: IN PROGRESS  7.  Patient will report 75% improvement in sleep disruption due to pain.  Baseline: sleep limited due to pain Goal status: IN PROGRESS    PLAN: PT FREQUENCY: 1-2x/week  PT DURATION: 6 weeks  PLANNED INTERVENTIONS: Therapeutic exercises, Therapeutic activity, Neuromuscular re-education, Balance training, Gait training, Patient/Family education, Self Care, Joint mobilization, Dry Needling, Electrical stimulation, Spinal mobilization, Cryotherapy, Moist heat, Traction, Ultrasound, Manual therapy, and Re-evaluation.  PLAN FOR NEXT SESSION: continue core exercises with emphasis on core  stability, manual therapy, modalities PRN.   Artist Pais, PTA 02/02/2022, 2:46 PM

## 2022-02-04 ENCOUNTER — Ambulatory Visit: Payer: Medicare Other | Admitting: Physical Therapy

## 2022-02-04 ENCOUNTER — Encounter: Payer: Self-pay | Admitting: Physical Therapy

## 2022-02-04 DIAGNOSIS — M5441 Lumbago with sciatica, right side: Secondary | ICD-10-CM | POA: Diagnosis not present

## 2022-02-04 DIAGNOSIS — M6281 Muscle weakness (generalized): Secondary | ICD-10-CM | POA: Diagnosis not present

## 2022-02-04 DIAGNOSIS — M5442 Lumbago with sciatica, left side: Secondary | ICD-10-CM | POA: Diagnosis not present

## 2022-02-04 DIAGNOSIS — R252 Cramp and spasm: Secondary | ICD-10-CM | POA: Diagnosis not present

## 2022-02-04 DIAGNOSIS — G8929 Other chronic pain: Secondary | ICD-10-CM

## 2022-02-04 NOTE — Therapy (Signed)
OUTPATIENT PHYSICAL THERAPY TREATMENT   Patient Name: Joshua [name] MRN: [id number] DOB:1950-10-28, 71 y.o., male Today's Date: 02/04/2022   PT End of Session - 02/04/22 1404     Visit Number 5    Number of Visits 12    Date for PT Re-Evaluation 03/01/22    Authorization Type Medicare    PT Start Time 6812    PT Stop Time 7517    PT Time Calculation (min) 40 min    Activity Tolerance Patient tolerated treatment well    Behavior During Therapy WFL for tasks assessed/performed               Past Medical History:  Diagnosis Date   Asthma    BPH (benign prostatic hyperplasia)    GERD (gastroesophageal reflux disease)    HLD (hyperlipidemia)    HSV infection    Hypertension    Kidney stones    Past Surgical History:  Procedure Laterality Date   APPENDECTOMY     BREAST CYST EXCISION Left    benign   LITHOTRIPSY     TONSILLECTOMY     Patient Active Problem List   Diagnosis Date Noted   Rhinitis 06/09/2020   Healthcare maintenance 06/09/2020   Vitreous floater, bilateral 05/15/2019   Nuclear sclerotic cataract of both eyes 05/15/2019   Precordial chest pain 04/06/2018   Dyspnea on exertion 04/06/2018   Dyslipidemia 04/06/2018   BPH with urinary obstruction 03/31/2018   Essential hypertension 01/17/2018   Solitary lung nodule 09/07/2017   Sinusitis, chronic/Cough 07/22/2016   Chronic asthma, mild persistent, uncomplicated 00/17/4944   Upper airway cough syndrome 06/29/2016    PCP: Darreld Mclean., MD  REFERRING PROVIDER: Darreld Mclean., MD  REFERRING DIAG: M54.32 (ICD-10-CM) - Sciatica, left side  Rationale for Evaluation and Treatment Rehabilitation  THERAPY DIAG:  Acute right-sided low back pain with right-sided sciatica  Chronic left-sided low back pain with left-sided sciatica  Muscle weakness (generalized)  Cramp and spasm  ONSET DATE: 2 weeks ago  SUBJECTIVE:                                                                                                                                                                                            SUBJECTIVE STATEMENT: Patient reports very busy at work.  He had to mow the lawn yesterday, but tried to take breaks as needed.       PERTINENT HISTORY:  HTN, asthma, dyspnea on exertion, precordial chest pain  PAIN:  Are you having pain? Yes: NPRS scale: 4-5/10 Pain location: across low back Pain description: sore but only for certain movements, still going down  R leg, feels like pulled muscle back of L leg     PRECAUTIONS: None  WEIGHT BEARING RESTRICTIONS No  FALLS:  Has patient fallen in last 6 months? 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 Alignment of posterior margins of vertebral bodies is within normal limits. Degenerative changes are noted with anterior bony spurs and facet hypertrophy, more so at L4-L5 and L5-S1 levels. There is mild narrowing of disc space at L4-L5 level. There is interval progression of degenerative changes. IMPRESSION: No recent fracture is seen in the lumbar spine. Lumbar spondylosis, more so at L4-L5 and L5-S1 levels with interval progression.   PATIENT SURVEYS:  Modified Oswestry 19/50 = 38% disability - moderate   FOTO 47%, 64% predicted after 11 visits.    SENSATION: Reports numbness in 2&3rd toes bil, intact to light touch all dermatomes.   MUSCLE LENGTH: Hamstrings: Right 80 deg; Left 80 deg Ely's: Right 90 deg; Left 90 deg  POSTURE: forward head  PALPATION: Pain L4/5, L SIJ, L piriformis, R QL, R lumbar  multifidi L3-5  LUMBAR ROM:   Active  AROM  eval  Flexion To ankles, L side pain  Extension WNL, no pain  Right lateral flexion To knee, R side pain  Left lateral flexion To knee, L side pain  Right rotation WNL  Left rotation WNL   (Blank rows = not tested)  LOWER EXTREMITY ROM:     Passive  Right eval Left eval  Hip flexion WNL WNL  Hip extension    Hip internal rotation 15 15  Hip external rotation 35 45  Knee flexion    Knee extension 0 0   (Blank rows = not tested)  LOWER EXTREMITY MMT:    MMT Right eval Left eval  Hip flexion 5* 5  Hip extension 4* 4+  Hip abduction 5 5  Hip adduction 5 5*  Knee flexion 5 5  Knee extension 5 5  Ankle dorsiflexion 5 5  Ankle plantarflexion 5 5   (Blank rows = not tested) *pain  LUMBAR SPECIAL TESTS:  Straight leg raise test: Negative  FUNCTIONAL TESTS:  5 times sit to stand: 16 seconds without UE assist.   GAIT: Distance walked: 74' Assistive device utilized: None Level of assistance: Complete Independence Comments: no deviation or device.     TODAY'S TREATMENT  02/04/2022 Therapeutic Exercise: to improve strength and mobility.  Demo, verbal and tactile cues throughout for technique. Bike L2 x 6 min  Standing extensions x 20 Bridges 3 x 10  SKTC stretch 2 x 30 sec r/l Active HS stretch x 10 bil  Piriformis stretch & figure 4 stretch x 30 sec  Prone press-ups (on elbows) x 10 Prone leg extensions 2 x 10 bil - cramping R hamstring.  Manual Therapy: to decrease muscle spasm and pain and improve mobility IASTM with foam roller to bil glutes, hamstrings, lumbar paraspinals.  PA/UPA mobs to lumbar spine, MFR bil QL, STM/TPR R glut medius/piriformis, cross friction R proximal hamstring.   02/02/22 Therapeutic Exercise: UBE L1 6 min fwd Supine KTOS + figure 4 stretch x 30 sec hold SKTC  x 30 sec hold bil Bridges 2x10 - unable to clear table Manual Therapy: STM to R glute med, proximal hamstrings, R QL, lumbar  PS  01/27/2022 Therapeutic Exercise: to improve strength and mobility.  Demo, verbal and tactile cues throughout for technique. Nustep L5 x 6 min  Side glides - 2 x 10 bil - preference for right arm on wall.  Back extension x 20 on wall In supine: LTR x 10  PPT x 10 Bridges 2 x 10 with TrA - unable to clear buttocks SKTC stretch 2 x 30 sec bil  KTOS stretch  Windshield wiper active stretch for IR/ER hip x 10  Manual Therapy: to decrease muscle spasm and pain and improve mobility.  IASTM with foam roller to glutes and proximal hamstrings. STM/TPR to R QL, R lumbar paraspinals, R UPA mobs to L4/5, cross friction to R proximal hamstring attachment.    PATIENT EDUCATION:  Education details: progressed HEP 01/27/22 Person educated: Patient Education method: Explanation, Demonstration, Verbal cues, and Handouts Education comprehension: verbalized understanding and returned demonstration   HOME EXERCISE PROGRAM: Access Code: [email or web address]  ASSESSMENT:  CLINICAL IMPRESSION: Joshua [name] tolerated exercises well, demonstrating improving strength, able to easily clear buttocks from mat with bridges today.  Focus of exercises continued improvement of hip mobility, also added dynamic HS stretches today, patient surprised by how tight these were.  After manual therapy, patient reported no pain with lumbar ROM.  Joshua [name] continues to demonstrate potential for improvement and would benefit from continued skilled therapy to address impairments.     OBJECTIVE IMPAIRMENTS decreased endurance, decreased mobility, decreased ROM, decreased strength, hypomobility, increased muscle spasms, impaired flexibility, and pain.   ACTIVITY LIMITATIONS carrying, lifting, bending, sitting, standing, sleeping, and locomotion level  PARTICIPATION LIMITATIONS: cleaning, laundry, shopping, community activity, occupation, yard work, and exercise  PERSONAL FACTORS Time since onset of injury/illness/exacerbation and 3+  comorbidities: history chronic back pain, HTN, asthma, dyspnea on exertion, precordial chest pain   are also affecting patient's functional outcome.   REHAB POTENTIAL: Good  CLINICAL DECISION MAKING: Evolving/moderate complexity  EVALUATION COMPLEXITY: Moderate   GOALS: Goals reviewed with patient? Yes  SHORT TERM GOALS: Target date: 02/01/2022  Patient will be independent with initial HEP.  Baseline: given Goal status: MET  01/27/22- reports good compliance.   2.  Patient will report centralization of radicular symptoms.  Baseline: numbness in 2nd and 3rd toes Goal status: IN PROGRESS 01/27/22- intermittent tingling in toes now    LONG TERM GOALS: Target date: 03/01/2022    Patient will be independent with advanced/ongoing HEP to improve outcomes and carryover.  Baseline: needs advancement.  Goal status: IN PROGRESS  2.  Patient will report 75% improvement in low back pain to improve QOL.  Baseline: 6/10 pain average Goal status: IN PROGRESS  3.  Patient will demonstrate full pain free lumbar ROM to perform ADLs.   Baseline: see objective Goal status: IN PROGRESS  4.  Patient will demonstrate improved functional strength as demonstrated by 5x STS <12 seconds . Baseline: 16 seconds Goal status: IN PROGRESS  5.  Patient will report 64% on lumbar FOTO to demonstrate improved functional ability.  Baseline: 47% Goal status: IN PROGRESS   6.  Patient will tolerate 20 min of walking to perform exercise without increased LBP. Baseline: limited due to pain Goal status: IN PROGRESS  7.  Patient will report 75% improvement in sleep disruption due to pain.  Baseline: sleep limited due to pain Goal status: IN PROGRESS  PLAN: PT FREQUENCY: 1-2x/week  PT DURATION: 6 weeks  PLANNED INTERVENTIONS: Therapeutic exercises, Therapeutic activity, Neuromuscular re-education, Balance training, Gait training, Patient/Family education, Self Care, Joint mobilization, Dry Needling,  Electrical stimulation, Spinal mobilization, Cryotherapy, Moist heat, Traction, Ultrasound, Manual therapy, and Re-evaluation.  PLAN FOR NEXT SESSION: continue core exercises with emphasis on core stability, manual therapy, modalities PRN.   Rennie Natter, PT, DPT  02/04/2022, 2:50 PM

## 2022-02-08 ENCOUNTER — Encounter: Payer: Medicare Other | Admitting: Physical Therapy

## 2022-02-11 ENCOUNTER — Encounter: Payer: Medicare Other | Admitting: Physical Therapy

## 2022-02-15 ENCOUNTER — Encounter: Payer: Self-pay | Admitting: Podiatry

## 2022-02-15 ENCOUNTER — Ambulatory Visit (INDEPENDENT_AMBULATORY_CARE_PROVIDER_SITE_OTHER): Payer: Medicare Other | Admitting: Podiatry

## 2022-02-15 DIAGNOSIS — B351 Tinea unguium: Secondary | ICD-10-CM | POA: Diagnosis not present

## 2022-02-15 DIAGNOSIS — L600 Ingrowing nail: Secondary | ICD-10-CM

## 2022-02-17 NOTE — Progress Notes (Signed)
Subjective:   Patient ID: Joshua [name], male   DOB: 71 y.o.   MRN: [id number]   HPI Patient concerned about losing the fifth nail left and whether or not there may be some fungus that is part of the deformity and pathology   ROS      Objective:  Physical Exam  Neurovascular status intact with fifth nailbed left that was lost with rotation of the toe and pressure against this with no active erythema edema or drainage currently     Assessment:  Traumatized left fifth nail which is created an inflammatory complex     Plan:  Advised patient on soaks therapy and wider shoe gear and hopefully this will be the end of the problem.  Patient will regrow may ultimately require nail removal that I educated him on today but at this point it does not appear infected and if any redness or drainage were to occur he is to reappoint

## 2022-02-18 ENCOUNTER — Encounter: Payer: Self-pay | Admitting: Physical Therapy

## 2022-02-18 ENCOUNTER — Ambulatory Visit: Payer: Medicare Other | Admitting: Physical Therapy

## 2022-02-18 DIAGNOSIS — M5441 Lumbago with sciatica, right side: Secondary | ICD-10-CM | POA: Diagnosis not present

## 2022-02-18 DIAGNOSIS — M6281 Muscle weakness (generalized): Secondary | ICD-10-CM

## 2022-02-18 DIAGNOSIS — G8929 Other chronic pain: Secondary | ICD-10-CM | POA: Diagnosis not present

## 2022-02-18 DIAGNOSIS — R252 Cramp and spasm: Secondary | ICD-10-CM | POA: Diagnosis not present

## 2022-02-18 DIAGNOSIS — M5442 Lumbago with sciatica, left side: Secondary | ICD-10-CM | POA: Diagnosis not present

## 2022-02-18 NOTE — Therapy (Signed)
OUTPATIENT PHYSICAL THERAPY TREATMENT   Patient Name: Joshua [name] MRN: [id number] DOB:1950/11/10, 71 y.o., male Today's Date: 02/18/2022   PT End of Session - 02/18/22 1405     Visit Number 6    Number of Visits 12    Date for PT Re-Evaluation 03/01/22    Authorization Type Medicare    PT Start Time 7829    PT Stop Time 5621    PT Time Calculation (min) 42 min    Activity Tolerance Patient tolerated treatment well    Behavior During Therapy WFL for tasks assessed/performed               Past Medical History:  Diagnosis Date   Asthma    BPH (benign prostatic hyperplasia)    GERD (gastroesophageal reflux disease)    HLD (hyperlipidemia)    HSV infection    Hypertension    Kidney stones    Past Surgical History:  Procedure Laterality Date   APPENDECTOMY     BREAST CYST EXCISION Left    benign   LITHOTRIPSY     TONSILLECTOMY     Patient Active Problem List   Diagnosis Date Noted   Rhinitis 06/09/2020   Healthcare maintenance 06/09/2020   Vitreous floater, bilateral 05/15/2019   Nuclear sclerotic cataract of both eyes 05/15/2019   Precordial chest pain 04/06/2018   Dyspnea on exertion 04/06/2018   Dyslipidemia 04/06/2018   BPH with urinary obstruction 03/31/2018   Essential hypertension 01/17/2018   Solitary lung nodule 09/07/2017   Sinusitis, chronic/Cough 07/22/2016   Chronic asthma, mild persistent, uncomplicated 30/86/5784   Upper airway cough syndrome 06/29/2016    PCP: Darreld Mclean., MD  REFERRING PROVIDER: Darreld Mclean., MD  REFERRING DIAG: M54.32 (ICD-10-CM) - Sciatica, left side  Rationale for Evaluation and Treatment Rehabilitation  THERAPY DIAG:  Acute right-sided low back pain with right-sided sciatica  Chronic left-sided low back pain with left-sided sciatica  Muscle weakness (generalized)  Cramp and spasm  ONSET DATE: 2 weeks ago  SUBJECTIVE:                                                                                                                                                                                            SUBJECTIVE STATEMENT: Patient reports pain now mostly with heavier household activities, like lawn mowing and lifting heavier items.  R side bothers more during day, L side bother more at night.  Getting some cramping RLE with SKTC stretch.     PERTINENT HISTORY:  HTN, asthma, dyspnea on exertion, precordial chest pain  PAIN:  Are you having pain? Yes: NPRS scale: 3-4/10 Pain location:  across low back Pain description: sore but only for certain movements, still going down R leg, feels like pulled muscle back of L leg     PRECAUTIONS: None  WEIGHT BEARING RESTRICTIONS No  FALLS:  Has patient fallen in last 6 months? 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 Alignment of posterior margins of vertebral bodies is within normal limits. Degenerative changes are noted with anterior bony spurs and facet hypertrophy, more so at L4-L5 and L5-S1 levels. There is mild narrowing of disc space at L4-L5 level. There is interval progression of degenerative changes. IMPRESSION: No recent fracture is seen in the lumbar spine. Lumbar spondylosis, more so at L4-L5 and L5-S1 levels with interval progression.   PATIENT SURVEYS:  Modified Oswestry 19/50 = 38% disability - moderate   FOTO 47%, 64% predicted after 11 visits.    SENSATION: Reports numbness in 2&3rd toes bil, intact to light touch all dermatomes.   MUSCLE LENGTH: Hamstrings: Right 80 deg; Left 80 deg Ely's: Right 90  deg; Left 90 deg  POSTURE: forward head  PALPATION: Pain L4/5, L SIJ, L piriformis, R QL, R lumbar multifidi L3-5  LUMBAR ROM:   Active  AROM  eval  Flexion To ankles, L side pain  Extension WNL, no pain  Right lateral flexion To knee, R side pain  Left lateral flexion To knee, L side pain  Right rotation WNL  Left rotation WNL   (Blank rows = not tested)  LOWER EXTREMITY ROM:     Passive  Right eval Left eval  Hip flexion WNL WNL  Hip extension    Hip internal rotation 15 15  Hip external rotation 35 45  Knee flexion    Knee extension 0 0   (Blank rows = not tested)  LOWER EXTREMITY MMT:    MMT Right eval Left eval  Hip flexion 5* 5  Hip extension 4* 4+  Hip abduction 5 5  Hip adduction 5 5*  Knee flexion 5 5  Knee extension 5 5  Ankle dorsiflexion 5 5  Ankle plantarflexion 5 5   (Blank rows = not tested) *pain  LUMBAR SPECIAL TESTS:  Straight leg raise test: Negative  FUNCTIONAL TESTS:  5 times sit to stand: 16 seconds without UE assist.   GAIT: Distance walked: 47' Assistive device utilized: None Level of assistance: Complete Independence Comments: no deviation or device.     TODAY'S TREATMENT  02/18/2022 Therapeutic Exercise: to improve strength and mobility.  Demo, verbal and tactile cues throughout for technique. Bike L2 x 6 min  Sciatic nerve glides in supine 2 x 10 bil  Bridges 2 x 10 - cues for eccentric control Straight leg raise 2 x 10 bil -fatiguing S/L clams 2 x 10 bil  Prone knee bends 2 x 10 bil  Prone knee extension 2 x 10 bil  Manual Therapy: to decrease muscle spasm and pain and improve mobility IASTM with foam roller to bil glutes, hamstrings, lumbar paraspinals.  PA/UPA mobs to lumbar spine, MFR bil QL, STM/TPR R glut medius/piriformis, cross friction R proximal hamstring.    02/04/2022 Therapeutic Exercise: to improve  strength and mobility.  Demo, verbal and tactile cues throughout for technique. Bike L2 x 6 min  Standing  extensions x 20 Bridges 3 x 10  SKTC stretch 2 x 30 sec r/l Active HS stretch x 10 bil  Piriformis stretch & figure 4 stretch x 30 sec  Prone press-ups (on elbows) x 10 Prone leg extensions 2 x 10 bil - cramping R hamstring.  Manual Therapy: to decrease muscle spasm and pain and improve mobility IASTM with foam roller to bil glutes, hamstrings, lumbar paraspinals.  PA/UPA mobs to lumbar spine, MFR bil QL, STM/TPR R glut medius/piriformis, cross friction R proximal hamstring.   02/02/22 Therapeutic Exercise: UBE L1 6 min fwd Supine KTOS + figure 4 stretch x 30 sec hold SKTC x 30 sec hold bil Bridges 2x10 - unable to clear table Manual Therapy: STM to R glute med, proximal hamstrings, R QL, lumbar PS   PATIENT EDUCATION:  Education details: progressed HEP 01/27/22, 02/18/2022 Person educated: Patient Education method: Explanation, Demonstration, Verbal cues, and Handouts Education comprehension: verbalized understanding and returned demonstration   HOME EXERCISE PROGRAM: Access Code: [email or web address]  ASSESSMENT:  CLINICAL IMPRESSION: Holmes Overacker tolerated exercises well, demonstrating improving strength.  Reporting some cramping in HS still, added sciatic nerve glides and prone knee bends to decrease nerve irritation, and progressed HEP for strengthening as well.  He felt exercises were very beneficial today.  Terren Jandreau continues to demonstrate potential for improvement and would benefit from continued skilled therapy to address impairments.     OBJECTIVE IMPAIRMENTS decreased endurance, decreased mobility, decreased ROM, decreased strength, hypomobility, increased muscle spasms, impaired flexibility, and pain.   ACTIVITY LIMITATIONS carrying, lifting, bending, sitting, standing, sleeping, and locomotion level  PARTICIPATION LIMITATIONS: cleaning, laundry, shopping, community activity, occupation, yard work, and exercise  PERSONAL FACTORS Time since onset of injury/illness/exacerbation  and 3+ comorbidities: history chronic back pain, HTN, asthma, dyspnea on exertion, precordial chest pain   are also affecting patient's functional outcome.   REHAB POTENTIAL: Good  CLINICAL DECISION MAKING: Evolving/moderate complexity  EVALUATION COMPLEXITY: Moderate   GOALS: Goals reviewed with patient? Yes  SHORT TERM GOALS: Target date: 02/01/2022  Patient will be independent with initial HEP.  Baseline: given Goal status: MET  01/27/22- reports good compliance.   2.  Patient will report centralization of radicular symptoms.  Baseline: numbness in 2nd and 3rd toes Goal status: IN PROGRESS 01/27/22- intermittent tingling in toes now    LONG TERM GOALS: Target date: 03/01/2022    Patient will be independent with advanced/ongoing HEP to improve outcomes and carryover.  Baseline: needs advancement.  Goal status: IN PROGRESS  2.  Patient will report 75% improvement in low back pain to improve QOL.  Baseline: 6/10 pain average Goal status: IN PROGRESS  3.  Patient will demonstrate full pain free lumbar ROM to perform ADLs.   Baseline: see objective Goal status: IN PROGRESS  4.  Patient will demonstrate improved functional strength as demonstrated by 5x STS <12 seconds . Baseline: 16 seconds Goal status: IN PROGRESS  5.  Patient will report 64% on lumbar FOTO to demonstrate improved functional ability.  Baseline: 47% Goal status: IN PROGRESS   6.  Patient will tolerate 20 min of walking to perform exercise without increased LBP. Baseline: limited due to pain Goal status: IN PROGRESS  7.  Patient will report 75% improvement in sleep disruption due to pain.  Baseline: sleep limited due to pain Goal status: IN PROGRESS    PLAN: PT FREQUENCY: 1-2x/week  PT DURATION: 6 weeks  PLANNED INTERVENTIONS: Therapeutic exercises, Therapeutic activity, Neuromuscular re-education, Balance training, Gait training, Patient/Family education, Self Care, Joint mobilization, Dry Needling,  Electrical stimulation, Spinal mobilization, Cryotherapy, Moist heat, Traction, Ultrasound, Manual therapy, and Re-evaluation.  PLAN FOR NEXT SESSION: continue core exercises with emphasis on core stability, manual therapy, modalities PRN.   Rennie Natter, PT, DPT  02/18/2022, 2:55 PM

## 2022-02-22 ENCOUNTER — Ambulatory Visit: Payer: Medicare Other | Admitting: Physical Therapy

## 2022-02-24 ENCOUNTER — Ambulatory Visit: Payer: Medicare Other

## 2022-02-24 DIAGNOSIS — M6281 Muscle weakness (generalized): Secondary | ICD-10-CM

## 2022-02-24 DIAGNOSIS — M5441 Lumbago with sciatica, right side: Secondary | ICD-10-CM

## 2022-02-24 DIAGNOSIS — M5442 Lumbago with sciatica, left side: Secondary | ICD-10-CM | POA: Diagnosis not present

## 2022-02-24 DIAGNOSIS — R252 Cramp and spasm: Secondary | ICD-10-CM

## 2022-02-24 DIAGNOSIS — G8929 Other chronic pain: Secondary | ICD-10-CM | POA: Diagnosis not present

## 2022-02-24 NOTE — Therapy (Signed)
OUTPATIENT PHYSICAL THERAPY TREATMENT   Patient Name: Joshua [name] MRN: [id number] DOB:08-03-1950, 71 y.o., male Today's Date: 02/24/2022   PT End of Session - 02/24/22 1457     Visit Number 7    Number of Visits 12    Date for PT Re-Evaluation 03/01/22    Authorization Type Medicare    PT Start Time 1401    PT Stop Time 1501    PT Time Calculation (min) 60 min    Activity Tolerance Patient tolerated treatment well    Behavior During Therapy WFL for tasks assessed/performed                Past Medical History:  Diagnosis Date   Asthma    BPH (benign prostatic hyperplasia)    GERD (gastroesophageal reflux disease)    HLD (hyperlipidemia)    HSV infection    Hypertension    Kidney stones    Past Surgical History:  Procedure Laterality Date   APPENDECTOMY     BREAST CYST EXCISION Left    benign   LITHOTRIPSY     TONSILLECTOMY     Patient Active Problem List   Diagnosis Date Noted   Rhinitis 06/09/2020   Healthcare maintenance 06/09/2020   Vitreous floater, bilateral 05/15/2019   Nuclear sclerotic cataract of both eyes 05/15/2019   Precordial chest pain 04/06/2018   Dyspnea on exertion 04/06/2018   Dyslipidemia 04/06/2018   BPH with urinary obstruction 03/31/2018   Essential hypertension 01/17/2018   Solitary lung nodule 09/07/2017   Sinusitis, chronic/Cough 07/22/2016   Chronic asthma, mild persistent, uncomplicated 47/02/6282   Upper airway cough syndrome 06/29/2016    PCP: Darreld Mclean., MD  REFERRING PROVIDER: Darreld Mclean., MD  REFERRING DIAG: M54.32 (ICD-10-CM) - Sciatica, left side  Rationale for Evaluation and Treatment Rehabilitation  THERAPY DIAG:  Acute right-sided low back pain with right-sided sciatica  Chronic left-sided low back pain with left-sided sciatica  Muscle weakness (generalized)  Cramp and spasm  ONSET DATE: 2 weeks ago  SUBJECTIVE:                                                                                                                                                                                            SUBJECTIVE STATEMENT: Over the weekend flew down to [place], the ride wasn't bad but had a rough landing that felt really hard. Now he notices more LBP.     PERTINENT HISTORY:  HTN, asthma, dyspnea on exertion, precordial chest pain  PAIN:  Are you having pain? Yes: NPRS scale: 6/10 Pain location: R Pain description: sore but only for certain movements, still going  down R leg, feels like pulled muscle back of L leg     PRECAUTIONS: None  WEIGHT BEARING RESTRICTIONS No  FALLS:  Has patient fallen in last 6 months? 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 Alignment of posterior margins of vertebral bodies is within normal limits. Degenerative changes are noted with anterior bony spurs and facet hypertrophy, more so at L4-L5 and L5-S1 levels. There is mild narrowing of disc space at L4-L5 level. There is interval progression of degenerative changes. IMPRESSION: No recent fracture is seen in the lumbar spine. Lumbar spondylosis, more so at L4-L5 and L5-S1 levels with interval progression.   PATIENT SURVEYS:  Modified Oswestry 19/50 = 38% disability - moderate   FOTO 47%, 64% predicted after 11 visits.    SENSATION: Reports numbness in 2&3rd toes bil, intact to light touch all dermatomes.   MUSCLE LENGTH: Hamstrings: Right 80 deg; Left 80 deg Ely's: Right 90 deg; Left 90 deg  POSTURE: forward head  PALPATION: Pain L4/5, L SIJ, L piriformis, R QL, R lumbar  multifidi L3-5  LUMBAR ROM:   Active  AROM  eval 02/24/22  Flexion To ankles, L side pain To floor- no pain  Extension WNL, no pain Mild pain- slightly limited  Right lateral flexion To knee, R side pain To distal thigh - pain  Left lateral flexion To knee, L side pain To knee  Right rotation WNL   Left rotation WNL    (Blank rows = not tested)  LOWER EXTREMITY ROM:     Passive  Right eval Left eval  Hip flexion WNL WNL  Hip extension    Hip internal rotation 15 15  Hip external rotation 35 45  Knee flexion    Knee extension 0 0   (Blank rows = not tested)  LOWER EXTREMITY MMT:    MMT Right eval Left eval  Hip flexion 5* 5  Hip extension 4* 4+  Hip abduction 5 5  Hip adduction 5 5*  Knee flexion 5 5  Knee extension 5 5  Ankle dorsiflexion 5 5  Ankle plantarflexion 5 5   (Blank rows = not tested) *pain  LUMBAR SPECIAL TESTS:  Straight leg raise test: Negative  FUNCTIONAL TESTS:  5 times sit to stand: 16 seconds without UE assist.   GAIT: Distance walked: 27' Assistive device utilized: None Level of assistance: Complete Independence Comments: no deviation or device.     TODAY'S TREATMENT  02/24/22 Therapeutic Exercise: Recumbent Bike L3x10mn R/L KTOS piriformis stretch supine 2x30"  Manual Therapy: STM to R glutes, piriformis, lumbar paraspinals  Estim- IFC, to R glutes and lumbar PS, 80-150 Hz, intensity to tolerance x 15 min  02/18/2022 Therapeutic Exercise: to improve strength and mobility.  Demo, verbal and tactile cues throughout for technique. Bike L2 x 6 min  Sciatic nerve glides in supine 2 x 10 bil  Bridges 2 x 10 - cues for eccentric control Straight leg raise 2 x 10 bil -fatiguing S/L clams 2 x 10 bil  Prone knee bends 2 x 10 bil  Prone knee extension 2 x 10 bil  Manual Therapy:  to decrease muscle spasm and pain and improve mobility IASTM with foam roller to bil glutes, hamstrings, lumbar paraspinals.  PA/UPA mobs to lumbar spine, MFR  bil QL, STM/TPR R glut medius/piriformis, cross friction R proximal hamstring.    02/04/2022 Therapeutic Exercise: to improve strength and mobility.  Demo, verbal and tactile cues throughout for technique. Bike L2 x 6 min  Standing extensions x 20 Bridges 3 x 10  SKTC stretch 2 x 30 sec r/l Active HS stretch x 10 bil  Piriformis stretch & figure 4 stretch x 30 sec  Prone press-ups (on elbows) x 10 Prone leg extensions 2 x 10 bil - cramping R hamstring.  Manual Therapy: to decrease muscle spasm and pain and improve mobility IASTM with foam roller to bil glutes, hamstrings, lumbar paraspinals.  PA/UPA mobs to lumbar spine, MFR bil QL, STM/TPR R glut medius/piriformis, cross friction R proximal hamstring.   02/02/22 Therapeutic Exercise: UBE L1 6 min fwd Supine KTOS + figure 4 stretch x 30 sec hold SKTC x 30 sec hold bil Bridges 2x10 - unable to clear table Manual Therapy: STM to R glute med, proximal hamstrings, R QL, lumbar PS   PATIENT EDUCATION:  Education details: progressed HEP 01/27/22, 02/18/2022 Person educated: Patient Education method: Explanation, Demonstration, Verbal cues, and Handouts Education comprehension: verbalized understanding and returned demonstration   HOME EXERCISE PROGRAM: Access Code: [email or web address]  ASSESSMENT:  CLINICAL IMPRESSION:  Progress is being made toward pain-free lumbar ROM. Pt reported increased pain from a rough landing from a flight this past weekend. Focused on STM to address increased muscle tension and pain along R low back. Palpable trigger points and tautness noted in R piriformis. Ended session with estim to R low back to address pain and tightness.   OBJECTIVE IMPAIRMENTS decreased endurance, decreased mobility, decreased ROM, decreased strength, hypomobility, increased muscle spasms, impaired flexibility, and pain.   ACTIVITY LIMITATIONS carrying, lifting, bending, sitting, standing, sleeping, and locomotion level  PARTICIPATION  LIMITATIONS: cleaning, laundry, shopping, community activity, occupation, yard work, and exercise  PERSONAL FACTORS Time since onset of injury/illness/exacerbation and 3+ comorbidities: history chronic back pain, HTN, asthma, dyspnea on exertion, precordial chest pain   are also affecting patient's functional outcome.   REHAB POTENTIAL: Good  CLINICAL DECISION MAKING: Evolving/moderate complexity  EVALUATION COMPLEXITY: Moderate   GOALS: Goals reviewed with patient? Yes  SHORT TERM GOALS: Target date: 02/01/2022  Patient will be independent with initial HEP.  Baseline: given Goal status: MET  01/27/22- reports good compliance.   2.  Patient will report centralization of radicular symptoms.  Baseline: numbness in 2nd and 3rd toes Goal status: IN PROGRESS 01/27/22- intermittent tingling in toes now    LONG TERM GOALS: Target date: 03/01/2022    Patient will be independent with advanced/ongoing HEP to improve outcomes and carryover.  Baseline: needs advancement.  Goal status: IN PROGRESS  2.  Patient will report 75% improvement in low back pain to improve QOL.  Baseline: 6/10 pain average Goal status: IN PROGRESS  3.  Patient will demonstrate full pain free lumbar ROM to perform ADLs.   Baseline: see objective Goal status: IN PROGRESS - 02/24/22 - check ROM chart  4.  Patient will demonstrate improved functional strength as demonstrated by 5x STS <12 seconds . Baseline: 16 seconds Goal status: IN PROGRESS  5.  Patient will report 64% on lumbar FOTO to demonstrate improved functional ability.  Baseline: 47% Goal status: IN PROGRESS   6.  Patient will tolerate 20 min of walking  to perform exercise without increased LBP. Baseline: limited due to pain Goal status: IN PROGRESS  7.  Patient will report 75% improvement in sleep disruption due to pain.  Baseline: sleep limited due to pain Goal status: IN PROGRESS    PLAN: PT FREQUENCY: 1-2x/week  PT DURATION: 6  weeks  PLANNED INTERVENTIONS: Therapeutic exercises, Therapeutic activity, Neuromuscular re-education, Balance training, Gait training, Patient/Family education, Self Care, Joint mobilization, Dry Needling, Electrical stimulation, Spinal mobilization, Cryotherapy, Moist heat, Traction, Ultrasound, Manual therapy, and Re-evaluation.  PLAN FOR NEXT SESSION: continue core exercises with emphasis on core stability, manual therapy, modalities PRN.   Artist Pais, PTA 02/24/2022, 3:11 PM

## 2022-03-04 ENCOUNTER — Encounter: Payer: Self-pay | Admitting: Physical Therapy

## 2022-03-09 ENCOUNTER — Ambulatory Visit: Payer: Medicare Other | Attending: Family Medicine | Admitting: Physical Therapy

## 2022-03-09 ENCOUNTER — Encounter: Payer: Self-pay | Admitting: Physical Therapy

## 2022-03-09 DIAGNOSIS — G8929 Other chronic pain: Secondary | ICD-10-CM | POA: Diagnosis not present

## 2022-03-09 DIAGNOSIS — R252 Cramp and spasm: Secondary | ICD-10-CM | POA: Diagnosis not present

## 2022-03-09 DIAGNOSIS — M6281 Muscle weakness (generalized): Secondary | ICD-10-CM | POA: Diagnosis not present

## 2022-03-09 DIAGNOSIS — M5441 Lumbago with sciatica, right side: Secondary | ICD-10-CM | POA: Insufficient documentation

## 2022-03-09 DIAGNOSIS — M5442 Lumbago with sciatica, left side: Secondary | ICD-10-CM | POA: Insufficient documentation

## 2022-03-09 NOTE — Therapy (Addendum)
OUTPATIENT PHYSICAL THERAPY TREATMENT PHYSICAL THERAPY DISCHARGE SUMMARY  Visits from Start of Care: 8  Current functional level related to goals / functional outcomes: From note on 03/09/2022 "Joshua [name] reports 60% overall improvement in LBP, and FOTO has improved to 65% from 47%.  He has met LTG #1, 4, 5, and 7 and making good progress towards remaining goals.  Chief complaint now is bil thigh pain and weakness.  Today educated on importance of regular exercise and recommendations for resistance training, with suggestions to consider joining [employer] or other gym. "   Remaining deficits: Pain with R SB   Education / Equipment: HEP  Plan: Patient agrees to discharge.  Patient is being discharged due to meeting the stated rehab goals.  He was scheduled to return to PT after returning from traveling but became ill.  He has not been seen since 03/09/2022 and would require new order to return to therapy.    Rennie Natter, PT, DPT 10:05 AM 05/07/2022    Progress Note Reporting Period 01/18/2022 to 03/09/2022  See note below for Objective Data and Assessment of Progress/Goals.      Patient Name: Joshua [name] MRN: [id number] DOB:Jan 10, 1951, 71 y.o., male Today's Date: 03/09/2022   PT End of Session - 03/09/22 1535     Visit Number 8    Number of Visits 12    Date for PT Re-Evaluation 04/09/22    Authorization Type Medicare    PT Start Time 1534    PT Stop Time 1615    PT Time Calculation (min) 41 min    Activity Tolerance Patient tolerated treatment well    Behavior During Therapy WFL for tasks assessed/performed                Past Medical History:  Diagnosis Date   Asthma    BPH (benign prostatic hyperplasia)    GERD (gastroesophageal reflux disease)    HLD (hyperlipidemia)    HSV infection    Hypertension    Kidney stones    Past Surgical History:  Procedure Laterality Date   APPENDECTOMY     BREAST CYST EXCISION Left    benign   LITHOTRIPSY      TONSILLECTOMY     Patient Active Problem List   Diagnosis Date Noted   Rhinitis 06/09/2020   Healthcare maintenance 06/09/2020   Vitreous floater, bilateral 05/15/2019   Nuclear sclerotic cataract of both eyes 05/15/2019   Precordial chest pain 04/06/2018   Dyspnea on exertion 04/06/2018   Dyslipidemia 04/06/2018   BPH with urinary obstruction 03/31/2018   Essential hypertension 01/17/2018   Solitary lung nodule 09/07/2017   Sinusitis, chronic/Cough 07/22/2016   Chronic asthma, mild persistent, uncomplicated 67/34/1937   Upper airway cough syndrome 06/29/2016    PCP: Darreld Mclean., MD  REFERRING PROVIDER: Darreld Mclean., MD  REFERRING DIAG: M54.32 (ICD-10-CM) - Sciatica, left side  Rationale for Evaluation and Treatment Rehabilitation  THERAPY DIAG:  Acute right-sided low back pain with right-sided sciatica  Chronic left-sided low back pain with left-sided sciatica  Muscle weakness (generalized)  Cramp and spasm  ONSET DATE: 2 weeks ago  SUBJECTIVE:  SUBJECTIVE STATEMENT: Joshua [name] reports back is much better, but legs feel sore and weak.   He will be traveling next 3 weeks, but would like to return after to continue PT.      PERTINENT HISTORY:  HTN, asthma, dyspnea on exertion, precordial chest pain  PAIN:  Are you having pain? Yes: NPRS scale: 2/10 Pain location: R Pain description: sore but only for certain movements, still going down R leg, feels like pulled muscle back of L leg     PRECAUTIONS: None  WEIGHT BEARING RESTRICTIONS No  FALLS:  Has patient fallen in last 6 months? 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 Alignment of posterior margins of vertebral bodies is within normal limits. Degenerative changes are noted with anterior bony spurs and facet hypertrophy, more so at L4-L5 and L5-S1 levels. There is mild narrowing of disc space at L4-L5 level. There is interval progression of degenerative changes. IMPRESSION: No recent fracture is seen in the lumbar spine. Lumbar spondylosis, more so at L4-L5 and L5-S1 levels with interval progression.   PATIENT SURVEYS:  Modified Oswestry 19/50 = 38% disability - moderate   FOTO 47%, 64% predicted after 11 visits.    SENSATION: Reports numbness in 2&3rd toes bil, intact to light touch all dermatomes.   MUSCLE LENGTH: Hamstrings: Right 80 deg; Left 80 deg Ely's: Right 90 deg; Left 90 deg  POSTURE: forward head  PALPATION: Pain L4/5, L SIJ, L piriformis, R QL, R lumbar multifidi L3-5  LUMBAR ROM:   Active  AROM  eval 02/24/22  Flexion To ankles, L side pain To floor- no pain  Extension WNL, no pain Mild pain- slightly limited  Right lateral flexion To knee, R side pain To distal thigh - pain  Left lateral flexion To knee, L side pain To knee  Right rotation WNL   Left rotation WNL    (Blank rows = not tested)  LOWER EXTREMITY ROM:     Passive  Right eval Left eval  Hip flexion WNL WNL  Hip extension    Hip internal rotation 15 15  Hip external rotation 35 45  Knee flexion    Knee extension 0 0   (Blank rows = not tested)  LOWER EXTREMITY MMT:    MMT Right eval Left eval  Hip flexion 5* 5  Hip extension 4* 4+  Hip abduction 5 5  Hip adduction 5 5*  Knee flexion 5 5  Knee extension 5 5  Ankle dorsiflexion 5 5  Ankle plantarflexion 5 5   (Blank rows = not  tested) *pain  LUMBAR SPECIAL TESTS:  Straight leg raise test: Negative  FUNCTIONAL TESTS:  5 times sit to stand: 16 seconds without UE assist.   GAIT: Distance walked: 106' Assistive device utilized: None Level of assistance: Complete Independence Comments: no deviation or device.     TODAY'S TREATMENT  03/09/2022  Therapeutic Exercise: to improve strength and mobility.  Demo, verbal and tactile cues throughout for technique. Nustep L5 x 8 min  Lat pull downs 15# 1 x 10 Rows 15# 1 x 10 Leg press 15# 1 x  10 Leg extension 10# 1 x 10  Leg curls 10# 1 x 10 Education on community based exercises.  Therapeutic Activity:  assessing progress and goals and plan FOTO   02/24/22 Therapeutic Exercise: Recumbent Bike L3x79mn R/L KTOS piriformis stretch supine 2x30"  Manual Therapy: STM to R glutes, piriformis, lumbar paraspinals  Estim- IFC, to R glutes and lumbar PS, 80-150 Hz, intensity to tolerance x 15 min  02/18/2022 Therapeutic Exercise: to improve strength and mobility.  Demo, verbal and tactile cues throughout for technique. Bike L2 x 6 min  Sciatic nerve glides in supine 2 x 10 bil  Bridges 2 x 10 - cues for eccentric control Straight leg raise 2 x 10 bil -fatiguing S/L clams 2 x 10 bil  Prone knee bends 2 x 10 bil  Prone knee extension 2 x 10 bil  Manual Therapy: to decrease muscle spasm and pain and improve mobility IASTM with foam roller to bil glutes, hamstrings, lumbar paraspinals.  PA/UPA mobs to lumbar spine, MFR bil QL, STM/TPR R glut medius/piriformis, cross friction R proximal hamstring.    02/04/2022 Therapeutic Exercise: to improve strength and mobility.  Demo, verbal and tactile cues throughout for technique. Bike L2 x 6 min  Standing extensions x 20 Bridges 3 x 10  SKTC stretch 2 x 30 sec r/l Active HS stretch x 10 bil  Piriformis stretch & figure 4 stretch x 30 sec  Prone press-ups (on elbows) x 10 Prone leg extensions 2 x 10 bil - cramping R  hamstring.  Manual Therapy: to decrease muscle spasm and pain and improve mobility IASTM with foam roller to bil glutes, hamstrings, lumbar paraspinals.  PA/UPA mobs to lumbar spine, MFR bil QL, STM/TPR R glut medius/piriformis, cross friction R proximal hamstring.   02/02/22 Therapeutic Exercise: UBE L1 6 min fwd Supine KTOS + figure 4 stretch x 30 sec hold SKTC x 30 sec hold bil Bridges 2x10 - unable to clear table Manual Therapy: STM to R glute med, proximal hamstrings, R QL, lumbar PS   PATIENT EDUCATION:  Education details: progressed HEP 01/27/22, 02/18/2022 Person educated: Patient Education method: EConsulting civil engineer Demonstration, Verbal cues, and Handouts Education comprehension: verbalized understanding and returned demonstration   HOME EXERCISE PROGRAM: Access Code: [email or web address] ASSESSMENT:  CLINICAL IMPRESSION: Mattew Mcclurkin reports 60% overall improvement in LBP, and FOTO has improved to 65% from 47%.  He has met LTG #1, 4, 5, and 7 and making good progress towards remaining goals.  Chief complaint now is bil thigh pain and weakness.  Today educated on importance of regular exercise and recommendations for resistance training, with suggestions to consider joining [employer] or other gym.  We briefly trialed different resistance equipment here, he did need cuing on tempo and appropriate resistance, only tolerating very low weights today.  He is very concerned about LBP returning.  Since he is traveling, extending plan of care out to 04/16/22 to re-evaluate his back pain after trip, and give opportunity to education on back safety, lifting and continue to progress self efficacy with back treatment and transition to community based exercise program.    OBJECTIVE IMPAIRMENTS decreased endurance, decreased mobility, decreased ROM, decreased strength, hypomobility, increased muscle spasms, impaired flexibility, and pain.   ACTIVITY LIMITATIONS carrying, lifting, bending, sitting, standing,  sleeping, and locomotion level  PARTICIPATION LIMITATIONS: cleaning, laundry, shopping, community activity, occupation, yard work, and exercise  PERSONAL FACTORS Time since onset of injury/illness/exacerbation and 3+ comorbidities: history chronic back pain, HTN, asthma, dyspnea on exertion, precordial chest pain  are also affecting patient's functional outcome.   REHAB POTENTIAL: Good  CLINICAL DECISION MAKING: Evolving/moderate complexity  EVALUATION COMPLEXITY: Moderate   GOALS: Goals reviewed with patient? Yes  SHORT TERM GOALS: Target date: 02/01/2022  Patient will be independent with initial HEP.  Baseline: given Goal status: MET  01/27/22- reports good compliance.   2.  Patient will report centralization of radicular symptoms.  Baseline: numbness in 2nd and 3rd toes Goal status: IN PROGRESS 01/27/22- intermittent tingling in toes now 03/09/22- saw podiatrist and told its normal., not related to back pain.      LONG TERM GOALS: Target date: 03/01/2022 extended to 04/16/22.  Patient will be independent with advanced/ongoing HEP to improve outcomes and carryover.  Baseline: needs advancement.  Goal status: MET  2.  Patient will report 75% improvement in low back pain to improve QOL.  Baseline: 6/10 pain average Goal status: IN PROGRESS 03/09/22- 60% improvement.   3.  Patient will demonstrate full pain free lumbar ROM to perform ADLs.   Baseline: see objective Goal status: IN PROGRESS - 02/24/22 - check ROM chart 03/09/22- a little pain with R SB now  4.  Patient will demonstrate improved functional strength as demonstrated by 5x STS <12 seconds . Baseline: 16 seconds Goal status: MET 03/09/22- 9 seconds  5.  Patient will report 64% on lumbar FOTO to demonstrate improved functional ability.  Baseline: 47% Goal status: MET 03/09/2022-   65%  6.  Patient will tolerate 20 min of walking to perform exercise without increased LBP. Baseline: limited due to pain Goal status: IN  PROGRESS 03/09/22- no back pain now with walking just leg pain.   7.  Patient will report 75% improvement in sleep disruption due to pain.  Baseline: sleep limited due to pain Goal status: MET 03/09/22- no back pain now but still gets soreness in both legs and sometimes L hip causing sleep disruption.      PLAN: PT FREQUENCY: 1-2x/week  PT DURATION: 6 weeks extended to 04/16/2022  PLANNED INTERVENTIONS: Therapeutic exercises, Therapeutic activity, Neuromuscular re-education, Balance training, Gait training, Patient/Family education, Self Care, Joint mobilization, Dry Needling, Electrical stimulation, Spinal mobilization, Cryotherapy, Moist heat, Traction, Ultrasound, Manual therapy, and Re-evaluation.  PLAN FOR NEXT SESSION: re-evaluate, education on safety with lifting, ADLs, continue to progress resistance training, transition to community based exercise program. .   Rennie Natter, PT, DPT 03/09/2022, 6:08 PM

## 2022-03-15 ENCOUNTER — Other Ambulatory Visit: Payer: Self-pay | Admitting: *Deleted

## 2022-03-15 MED ORDER — BUDESONIDE-FORMOTEROL FUMARATE 80-4.5 MCG/ACT IN AERO: INHALATION_SPRAY | RESPIRATORY_TRACT | 3 refills | Status: DC

## 2022-04-09 ENCOUNTER — Ambulatory Visit: Payer: Medicare Other | Admitting: Physical Therapy

## 2022-04-13 ENCOUNTER — Telehealth (INDEPENDENT_AMBULATORY_CARE_PROVIDER_SITE_OTHER): Payer: Medicare Other | Admitting: Family Medicine

## 2022-04-13 ENCOUNTER — Encounter: Payer: Self-pay | Admitting: Family Medicine

## 2022-04-13 DIAGNOSIS — J4531 Mild persistent asthma with (acute) exacerbation: Secondary | ICD-10-CM

## 2022-04-13 MED ORDER — PREDNISONE 20 MG PO TABS: 40.0000 mg | ORAL_TABLET | Freq: Every day | ORAL | 0 refills | Status: AC

## 2022-04-13 NOTE — Progress Notes (Signed)
Chief Complaint  Patient presents with   Cough    Joshua [name] here for URI complaints. Due to COVID-19 pandemic, we are interacting via web portal for an electronic face-to-face visit. I verified patient's ID using 2 identifiers. Patient agreed to proceed with visit via this method. Patient is at home, I am at office. Patient and I are present for visit.   Duration: 10 days; was traveling and came back with URI Getting worse.  Associated symptoms: rhinorrhea, stuffy nose, wheezing, shortness of breath, and sneezing, coughing Denies: sinus pain, itchy watery eyes, ear pain, ear drainage, sore throat, myalgia, and fevers Treatment to date: Delsym, Tylenol, INCS, has been using SABA more freq Sick contacts: No  Past Medical History:  Diagnosis Date   Asthma    BPH (benign prostatic hyperplasia)    GERD (gastroesophageal reflux disease)    HLD (hyperlipidemia)    HSV infection    Hypertension    Kidney stones     Objective No conversational dyspnea Age appropriate judgment and insight Nml affect and mood  Mild persistent asthma with exacerbation - Plan: predniSONE (DELTASONE) 20 MG tablet  Exacerbation of chronic issue. 5 d pred burst 40 mg/d. Send message if no better, will send Zpak but as he has asthma and not COPD, I don't think he needs at this time. Continue to push fluids, practice good hand hygiene, cover mouth when coughing. F/u prn. If starting to experience fevers, shaking, or shortness of breath, seek immediate care. Pt voiced understanding and agreement to the plan.  [place], DO 04/13/22 2:24 PM

## 2022-04-14 ENCOUNTER — Ambulatory Visit: Payer: Medicare Other

## 2022-04-16 ENCOUNTER — Other Ambulatory Visit: Payer: Self-pay | Admitting: Family Medicine

## 2022-04-16 ENCOUNTER — Encounter: Payer: Self-pay | Admitting: Family Medicine

## 2022-04-16 MED ORDER — AZITHROMYCIN 250 MG PO TABS: ORAL_TABLET | ORAL | 0 refills | Status: DC

## 2022-05-14 IMAGING — DX DG CHEST 2V
2 series · 2 of 2 positions shown · non-contrast
Comparison: 05/14/2020

CLINICAL DATA: Persistent cough following COVID infection

EXAM:
CHEST - 2 VIEW

[chest pa]
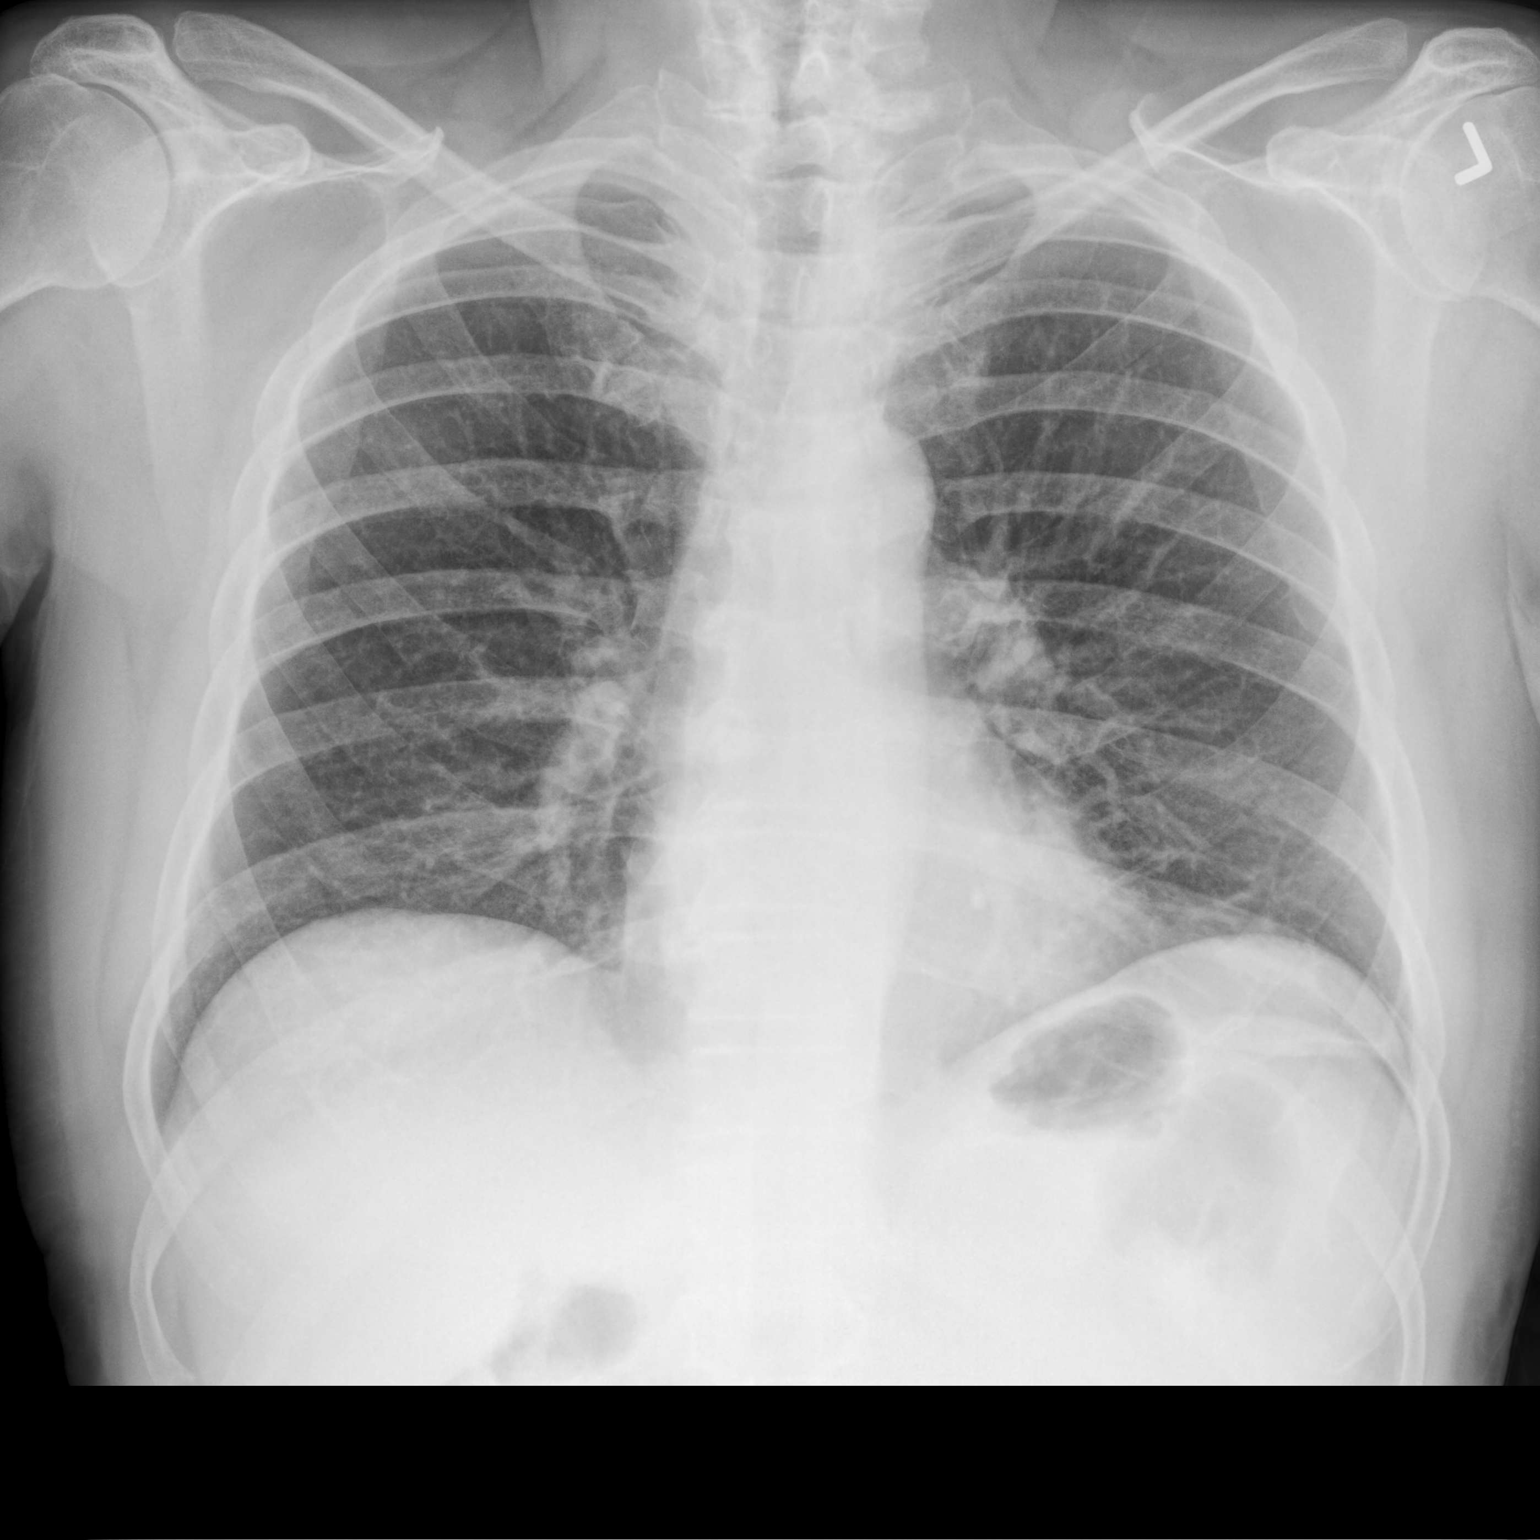

[chest lat]
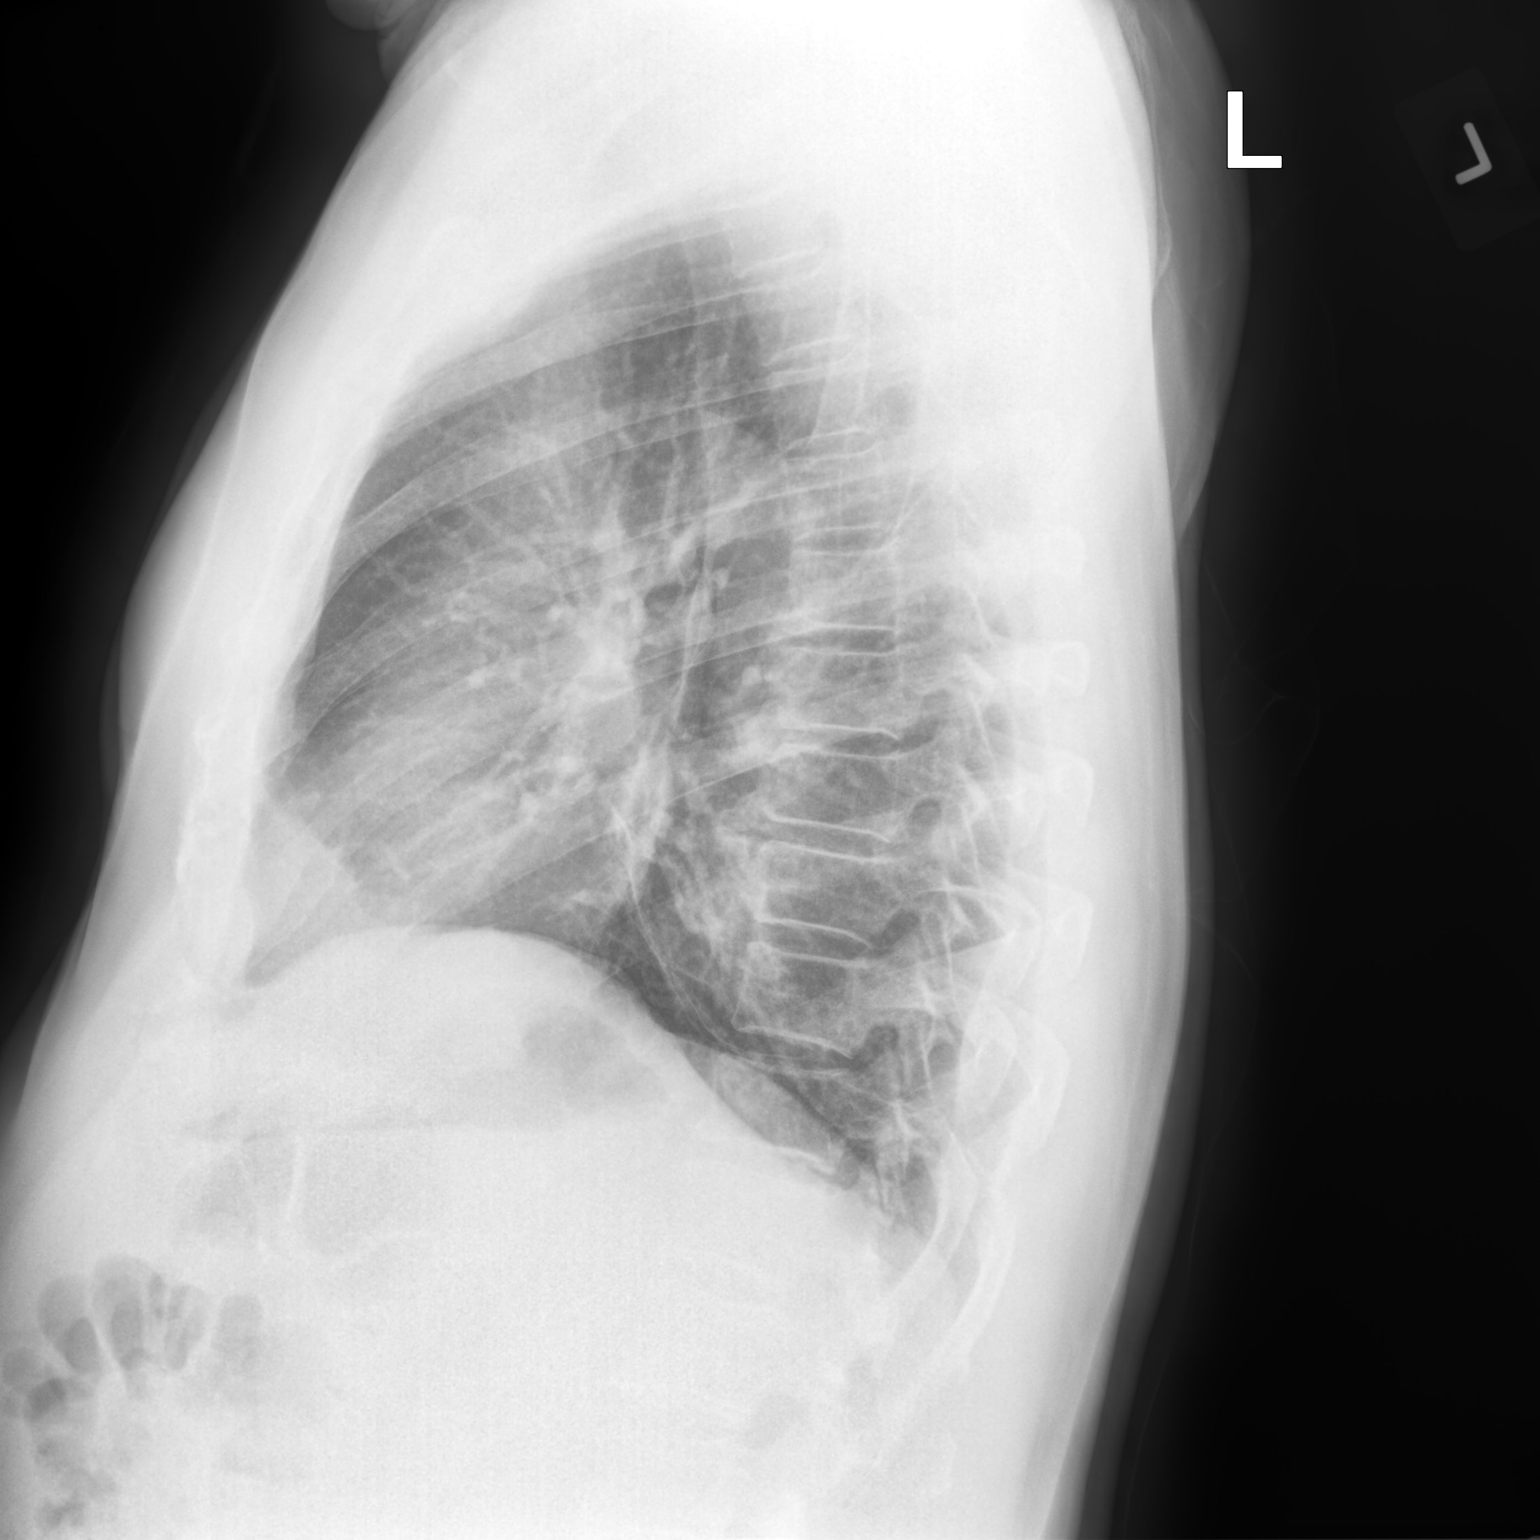

[2 of 2 positions shown; findings below may reference images not displayed]

FINDINGS: The heart size and mediastinal contours are within normal limits.
Both lungs are clear. The visualized skeletal structures are
unremarkable.
IMPRESSION: No active cardiopulmonary disease.

## 2022-05-15 NOTE — Progress Notes (Unsigned)
[place] at [facility] [address], [place], [place] [address] [phone]  Date:  05/17/2022   Name:  Joshua [name]   DOB:  [birth year]   MRN:  [id number]  PCP:  Darreld Mclean, MD    Chief Complaint: scratch and back pain (Lump on the left lower leg that is taking a while to heal. And back and muscle pain. /Flu shot today: declines /)   History of Present Illness:  Joshua [name] is a 71 y.o. very pleasant male patient who presents with the following:  Patient seen today with concern of a wound and back pain Most recent visit with myself was in May of this year for routine follow-up  Pt noted he was scratching his left knee area due to dry skin- he did not realize he had hurt himself but he then noted "a big wound" - he is not sure if this is ok or now He also has a spot on his left forehead that came up perhaps a month or so ago   History of hypertension, mild asthma, BPH, dyslipidemia, cataracts Most recent visit with myself about 1 year ago His pulmonologist is Dr. Melvyn Novas Urologist is Dr.Budzyn with [facility]  We got some x-rays of his lumbar spine in May-degenerative changes seen, worsened since 2018  He has been doing physical therapy for his back pain- however he needs a new referral, he was away on vacation and his orders expired, he is seeing improvement   Shingrix COVID-19 booster Flu shot- he plans to do after thanksgiving   Albuterol Symbicort Urotraxal Lipitor Losartan 100 Patient Active Problem List   Diagnosis Date Noted   Rhinitis 06/09/2020   Healthcare maintenance 06/09/2020   Vitreous floater, bilateral 05/15/2019   Nuclear sclerotic cataract of both eyes 05/15/2019   Precordial chest pain 04/06/2018   Dyspnea on exertion 04/06/2018   Dyslipidemia 04/06/2018   BPH with urinary obstruction 03/31/2018   Essential hypertension 01/17/2018   Solitary lung nodule 09/07/2017   Sinusitis,  chronic/Cough 07/22/2016   Chronic asthma, mild persistent, uncomplicated 46/80/3212   Upper airway cough syndrome 06/29/2016    Past Medical History:  Diagnosis Date   Asthma    BPH (benign prostatic hyperplasia)    GERD (gastroesophageal reflux disease)    HLD (hyperlipidemia)    HSV infection    Hypertension    Kidney stones     Past Surgical History:  Procedure Laterality Date   APPENDECTOMY     BREAST CYST EXCISION Left    benign   LITHOTRIPSY     TONSILLECTOMY      Social History   Tobacco Use   Smoking status: Former    Packs/day: 0.50    Years: 22.00    Total pack years: 11.00    Types: Cigarettes    Quit date: 06/28/1993    Years since quitting: 28.9   Smokeless tobacco: Never  Vaping Use   Vaping Use: Never used  Substance Use Topics   Alcohol use: Yes    Comment: occasionally   Drug use: No    Family History  Problem Relation Age of Onset   Kidney Stones Mother    Alzheimer's disease Father    Colon cancer Paternal Grandfather 62   Stomach cancer Neg Hx    Esophageal cancer Neg Hx    Pancreatic cancer Neg Hx     No Known Allergies  Medication list has been reviewed and updated.  Current Outpatient Medications on File Prior to Visit  Medication Sig Dispense Refill   acyclovir ointment (ZOVIRAX) 5 % Apply 1 application topically every 3 (three) hours. Use for one week as needed for outbreak 30 g 3   albuterol (VENTOLIN HFA) 108 (90 Base) MCG/ACT inhaler INHALE 1 TO 2 PUFFS INTO THE LUNGS EVERY 6 HOURS AS NEEDED FOR WHEEZING OR SHORTNESS OF BREATH 8.5 g 2   alfuzosin (UROXATRAL) 10 MG 24 hr tablet Take 1 tablet (10 mg total) by mouth daily. 90 tablet 3   atorvastatin (LIPITOR) 10 MG tablet TAKE 1 TABLET BY MOUTH DAILY 90 tablet 3   budesonide-formoterol (SYMBICORT) 80-4.5 MCG/ACT inhaler INHALE 2 PUFFS BY MOUTH FIRST THING IN THE MORNING THEN INHALE 2 PUFFS BY MOUTH 12 HOURS LATER 12.6 g 3   diclofenac Sodium (VOLTAREN) 1 % GEL Apply 2 g  topically 4 (four) times daily. Use as needed for pain 100 g 3   diphenhydrAMINE (BENADRYL) 25 mg capsule Take 1 capsule (25 mg total) by mouth every 6 (six) hours as needed. 30 capsule 0   losartan (COZAAR) 100 MG tablet TAKE 1 TABLET(100 MG) BY MOUTH DAILY 90 tablet 3   methocarbamol (ROBAXIN) 500 MG tablet Take 1 tablet (500 mg total) by mouth every 8 (eight) hours as needed for muscle spasms. 40 tablet 1   pantoprazole (PROTONIX) 20 MG tablet Take 1 tablet (20 mg total) by mouth daily. Can increase to twice daily if needed 180 tablet 1   tadalafil (CIALIS) 5 MG tablet Take 1 tablet (5 mg total) by mouth daily. Use daily for BPH 90 tablet 3   valACYclovir (VALTREX) 500 MG tablet Take 1 tablet (500 mg total) by mouth daily. 90 tablet 2   No current facility-administered medications on file prior to visit.    Review of Systems:  As per HPI- otherwise negative.   Physical Examination: Vitals:   05/17/22 0843  BP: 120/68  Pulse: 86  Resp: 18  Temp: 97.8 F (36.6 C)  SpO2: 97%   Vitals:   05/17/22 0843  Weight: 196 lb 3.2 oz (89 kg)  Height: '5\' 6"'$  (1.676 m)   Body mass index is 31.67 kg/m. Ideal Body Weight: Weight in (lb) to have BMI = 25: 154.6  GEN: no acute distress. Mild obesity, looks well  HEENT: Atraumatic, Normocephalic.  Ears and Nose: No external deformity. CV: RRR, No M/G/R. No JVD. No thrill. No extra heart sounds. PULM: CTA B, no wheezes, crackles, rhonchi. No retractions. No resp. distress. No accessory muscle use. EXTR: No c/c/e PSYCH: Normally interactive. Conversant.  Left shin displays a small area of dermatitis Non- specific skin lesion on left forehead- suspicious for skin cancer given location      Assessment and Plan: Dermatitis - Plan: triamcinolone cream (KENALOG) 0.1 %  Chronic right-sided low back pain without sciatica - Plan: Ambulatory referral to Physical Therapy  Pigmented skin lesion of uncertain behavior of head - Plan: Ambulatory  referral to Dermatology  Immunization due  Referral back to PT so he can continue treatment Trial of triamcinolone for dermatitis on leg - let me know if not better Will work on getting him back in with derm for skin lesion on his head Discussed needed immun He will see me for his CPE in the spring   Signed Lamar Blinks, MD

## 2022-05-15 NOTE — Patient Instructions (Incomplete)
It was great to see you again today! Referral made to [facility]- you can also give them a call [place]:    [phone]  [place]:  [phone] If not done already, recommend getting the flu shot, shingles vaccine, updated COVID booster, RSV at your pharmacy Try the triamcinolone cream on your leg as needed until it heals- let me know if not better in a couple of weeks Please see me for your physical in the spring

## 2022-05-17 ENCOUNTER — Ambulatory Visit (INDEPENDENT_AMBULATORY_CARE_PROVIDER_SITE_OTHER): Payer: Medicare Other | Admitting: Family Medicine

## 2022-05-17 VITALS — BP 120/68 | HR 86 | Temp 97.8°F | Resp 18 | Ht 66.0 in | Wt 196.2 lb

## 2022-05-17 DIAGNOSIS — M545 Low back pain, unspecified: Secondary | ICD-10-CM

## 2022-05-17 DIAGNOSIS — L309 Dermatitis, unspecified: Secondary | ICD-10-CM

## 2022-05-17 DIAGNOSIS — G8929 Other chronic pain: Secondary | ICD-10-CM

## 2022-05-17 DIAGNOSIS — L819 Disorder of pigmentation, unspecified: Secondary | ICD-10-CM | POA: Diagnosis not present

## 2022-05-17 DIAGNOSIS — Z23 Encounter for immunization: Secondary | ICD-10-CM

## 2022-05-17 MED ORDER — TRIAMCINOLONE ACETONIDE 0.1 % EX CREA: 1.0000 | TOPICAL_CREAM | Freq: Two times a day (BID) | CUTANEOUS | 0 refills | Status: AC

## 2022-05-26 ENCOUNTER — Telehealth: Payer: Self-pay | Admitting: Family Medicine

## 2022-05-26 NOTE — Telephone Encounter (Signed)
Spoke with patient to schedule Medicare Annual Wellness Visit (AWV) either virtually or phone   Patient did not want to schedule at this time.  He wanted a call back June 2024  Last AWV 04/23/21  please schedule with Nurse Health Adviser 2   45 min for awv-i and in office appointments 30 min for awv-s  phone/virtual appointments

## 2022-06-03 DIAGNOSIS — L2089 Other atopic dermatitis: Secondary | ICD-10-CM | POA: Diagnosis not present

## 2022-06-03 DIAGNOSIS — E669 Obesity, unspecified: Secondary | ICD-10-CM | POA: Diagnosis not present

## 2022-06-03 DIAGNOSIS — L570 Actinic keratosis: Secondary | ICD-10-CM | POA: Diagnosis not present

## 2022-06-08 ENCOUNTER — Encounter: Payer: Self-pay | Admitting: Internal Medicine

## 2022-06-08 ENCOUNTER — Ambulatory Visit (INDEPENDENT_AMBULATORY_CARE_PROVIDER_SITE_OTHER): Payer: Medicare Other | Admitting: Internal Medicine

## 2022-06-08 VITALS — BP 130/78 | HR 74 | Temp 98.2°F | Ht 66.0 in | Wt 196.6 lb

## 2022-06-08 DIAGNOSIS — J4530 Mild persistent asthma, uncomplicated: Secondary | ICD-10-CM | POA: Diagnosis not present

## 2022-06-08 MED ORDER — BUDESONIDE-FORMOTEROL FUMARATE 80-4.5 MCG/ACT IN AERO
INHALATION_SPRAY | RESPIRATORY_TRACT | 3 refills | Status: DC
Start: 2022-06-08 — End: 2022-11-09

## 2022-06-08 NOTE — Patient Instructions (Signed)
At onset of respiratory flare > pantoprazole should be :   Take  40 mg x 30-60 min before first meal of the day and Pepcid ac (famotidine) 20 mg one after supper  until cough is completely gone for at least a week without the need for cough suppression  Delsym for dry mucinex dm for the wet congested cough    Please schedule a follow up visit in 12 months but call sooner if needed

## 2022-06-08 NOTE — Assessment & Plan Note (Signed)
Onset around 2000 in [place] with allergy testing neg there 06/29/2016    reduce symbicort to 80 2 bid  - FENO 07/21/2016  =   24 on symb 80 2bid - Spirometry 07/21/2016  wnl including  Mid flows p am symbicort > try off > flared so restarted  - 09/05/2017    try symb increase to 160  - PFT's  10/03/2017  FEV1 2.57 (88 % ) ratio 67  p 21 % improvement from saba p symb 160 prior to study with DLCO  108 % corrects to 113 % for alv volume  With variable truncation of insp portion of f/v loop and reported better benefit from 80 than 160 strength c/w uacs / vcd component  - 05/31/2018 flare on symb 80 2bid  - 07/18/2018 added singulair trial > d/c 01/23/2019  - 07/18/18 Spirometry   FEV1 2.6 (88%)  Ratio 0.80  - 09/01/2018  After extensive coaching inhaler device,  effectiveness =    75% (short Ti) rec try symb 80 just one bid to see if helps hoarseness or flares asthma > consider adding spacer next  - try off singulair 01/23/2019  - 11/17/2021  After extensive coaching inhaler device,  effectiveness =    90% from a baseline of 75% > no change symb 80 2bid and approp saba using the rule of 2's/ reviewed   S/p flare of asthma p exp to crowds in [place] with lots of smoke exposure > now back to baseline   Advised on how to treat flares of cough with empirical rx for GERD until cough is gone x at least one week to prevent cyclical pattern of cough > reflux > cough see avs for instructions unique to this ov   F/u can be yearly, sooner prn          Each maintenance medication was reviewed in detail including emphasizing most importantly the difference between maintenance and prns and under what circumstances the prns are to be triggered using an action plan format where appropriate.  Total time for H and P, chart review, counseling, reviewing hfa device(s) and generating customized AVS unique to this office visit / same day charting = 25 min

## 2022-06-08 NOTE — Progress Notes (Signed)
Subjective:    Patient ID: Joshua [name], male   DOB: [birth year]    MRN: [id number]    Brief patient profile:  71 yo [place] male  with bad bronchitis around 1993 and quit smoking and then  all better until around 2000 while living in [place] with intermittent "wheezing" dx as asthma > never resolved  Self referred for dtc asthma  Allergy testing in [place] around age 71 = neg per pt    History of Present Illness  06/29/2016 1st [facility] office visit/ Matei Magnone   Chief Complaint  Patient presents with   Pulmonary Consult    moved from [place], chronic bronchitis, wheezing, dx with asthma in 2000, cold humidity makes it worse  maint on symb 160 2bid and at baseline intermittently noisy breathing one breath q 2-3 days  s pattern though not waking him  noct   Worse since  Jun 22 2016  with uri/ sinus symptoms >  [facility] rx  Amox/only a little better, still severe nasal congestion/severe fits of day > noct cough and subj wheeze.  Typical triggers are uri/not seasonal pattern rhintiis - happens up to 4 x yearly with or without symbicort and just as severe. rec Augmentin 875 mg take one pill twice daily  X 10 days   Please see patient coordinator before you leave today  to schedule CT sinus  in 10 days - no sooner Use nasal saline as much as possible to help moisturize your passages  Continue protonix but take it Take 30-60 min before first meal of the day  Prednisone 10 mg take  4 each am x 2 days,   2 each am x 2 days,  1 each am x 2 days and stop  Work on inhaler technique:  Plan A = Automatic = symbicort 80 Take 2 puffs first thing in am and then another 2 puffs about 12 hours later.  Plan B = Backup Only use your albuterol (Proir) as a rescue medication to be used if you can't catch your breath by resting or doing a relaxed purse lip breathing pattern.  - The less you use it, the better it will work when you need it. - Ok to use the inhaler up to 2 puffs  every 4 hours     07/21/2016  f/u  ov/Zerina Hallinan re: uacs vs asthma Chief Complaint  Patient presents with   Follow-up    Cough is much improved, but has not completely resolved. Breathing has improved.   cough is worse at bedtime and in am p rising but not waking up prematurely and not productive  Bloody nasal d/c / no sob at all  rec Use lots of saline nasal spray on your nose Please remember to go to the x-ray department downstairs for your tests - we will call you with the results when they are available. Ty off the symbicort to see if any worse and if so restart  At 2 pffs every 12 hour schedule ENT  >   [name] rec abx and f/u prn     11/17/2021  f/u ov/Shalicia Craghead re: uacs vs cough variant asthma maint on symbicort 80 2bid   Chief Complaint  Patient presents with   Follow-up    No complaints.   Dyspnea:  MMRC1 = can walk nl pace, flat grade, can't hurry or go uphills or steps s sob - pushes mower x 30 min    Cough: min in am  Sleeping: no resp cc  SABA  use: 1-2 x weekly  Rec Plan A = Automatic = Always=    symbicort 80 Take 2 puffs first thing in am and then another 2 puffs about 12 hours later.  Work on inhaler technique:   Plan B = Backup (to supplement plan A, not to replace it) Only use your albuterol inhaler as a rescue medication   06/08/2022  f/u ov/Ryka Beighley re: uacs vs cough variant asthma  maint on symbicort 80   Chief Complaint  Patient presents with   Follow-up    Since LOV Pt states he has been sick but went to PCP and received steroids and antibiotics. Pt states that he feels better now.   Dyspnea:  MMRC1 = can walk nl pace, flat grade, can't hurry or go uphills or steps s sob   Cough: more daytime cough  Sleeping: bed flat/ 2 pillows  SABA use: once a week uses hs  02: none  Covid status:   vax max      No obvious day to day or daytime variability or assoc excess/ purulent sputum or mucus plugs or hemoptysis or cp or chest tightness, subjective wheeze or overt sinus or hb symptoms.   Sleeping   without nocturnal  or early am exacerbation  of respiratory  c/o's or need for noct saba. Also denies any obvious fluctuation of symptoms with weather or environmental changes or other aggravating or alleviating factors except as outlined above   No unusual exposure hx or h/o childhood pna/ asthma or knowledge of premature birth.  Current Allergies, Complete Past Medical History, Past Surgical History, Family History, and Social History were reviewed in [employer] record.  ROS  The following are not active complaints unless bolded Hoarseness, sore throat, dysphagia, dental problems, itching, sneezing,  nasal congestion or discharge of excess mucus or purulent secretions, ear ache,   fever, chills, sweats, unintended wt loss or wt gain, classically pleuritic or exertional cp,  orthopnea pnd or arm/hand swelling  or leg swelling, presyncope, palpitations, abdominal pain, anorexia, nausea, vomiting, diarrhea  or change in bowel habits or change in bladder habits, change in stools or change in urine, dysuria, hematuria,  rash, arthralgias, visual complaints, headache, numbness, weakness or ataxia or problems with walking or coordination,  change in mood or  memory.        Current Meds  Medication Sig   acyclovir ointment (ZOVIRAX) 5 % Apply 1 application topically every 3 (three) hours. Use for one week as needed for outbreak   albuterol (VENTOLIN HFA) 108 (90 Base) MCG/ACT inhaler INHALE 1 TO 2 PUFFS INTO THE LUNGS EVERY 6 HOURS AS NEEDED FOR WHEEZING OR SHORTNESS OF BREATH   alfuzosin (UROXATRAL) 10 MG 24 hr tablet Take 1 tablet (10 mg total) by mouth daily.   atorvastatin (LIPITOR) 10 MG tablet TAKE 1 TABLET BY MOUTH DAILY   budesonide-formoterol (SYMBICORT) 80-4.5 MCG/ACT inhaler INHALE 2 PUFFS BY MOUTH FIRST THING IN THE MORNING THEN INHALE 2 PUFFS BY MOUTH 12 HOURS LATER   diclofenac Sodium (VOLTAREN) 1 % GEL Apply 2 g topically 4 (four) times daily. Use as needed for pain    losartan (COZAAR) 100 MG tablet TAKE 1 TABLET(100 MG) BY MOUTH DAILY   methocarbamol (ROBAXIN) 500 MG tablet Take 1 tablet (500 mg total) by mouth every 8 (eight) hours as needed for muscle spasms.   pantoprazole (PROTONIX) 20 MG tablet Take 1 tablet (20 mg total) by mouth daily. Can increase to twice daily if needed  tadalafil (CIALIS) 5 MG tablet Take 1 tablet (5 mg total) by mouth daily. Use daily for BPH   triamcinolone cream (KENALOG) 0.1 % Apply 1 Application topically 2 (two) times daily. Use as needed for dermatitis on leg   valACYclovir (VALTREX) 500 MG tablet Take 1 tablet (500 mg total) by mouth daily.                              Objective:   Physical Exam  Wts  06/08/2022     196   11/17/2021       198  04/16/2021     195  07/28/2020       203 07/30/2019         203  01/23/2019       201  09/01/2018         205  07/18/2018       200     02/28/2018          203   10/03/2017         204   09/05/2017       204 07/21/16 196 lb (88.9 kg)  06/29/16 197 lb 12.8 oz (89.7 kg)    Vital signs reviewed  06/08/2022  - Note at rest 02 sats  100% on RA   General appearance:    somber amb wm nad   HEENT : Oropharynx  clear/ top denture        NECK :  without  apparent JVD/ palpable Nodes/TM    LUNGS: no acc muscle use,  Nl contour chest which is clear to A and P bilaterally without cough on insp or exp maneuvers   CV:  RRR  no s3 or murmur or increase in P2, and no edema   ABD:  soft and nontender    MS:  Nl gait/ ext warm without deformities Or obvious joint restrictions  calf tenderness, cyanosis or clubbing    SKIN: warm and dry without lesions    NEURO:  alert, approp, nl sensorium with  no motor or cerebellar deficits apparent.              Assessment:

## 2022-06-14 ENCOUNTER — Encounter: Payer: Self-pay | Admitting: Physical Therapy

## 2022-06-14 ENCOUNTER — Ambulatory Visit: Payer: Medicare Other | Attending: Family Medicine | Admitting: Physical Therapy

## 2022-06-14 DIAGNOSIS — M545 Low back pain, unspecified: Secondary | ICD-10-CM | POA: Diagnosis not present

## 2022-06-14 DIAGNOSIS — G8929 Other chronic pain: Secondary | ICD-10-CM | POA: Diagnosis not present

## 2022-06-14 DIAGNOSIS — M5441 Lumbago with sciatica, right side: Secondary | ICD-10-CM | POA: Insufficient documentation

## 2022-06-14 DIAGNOSIS — M5442 Lumbago with sciatica, left side: Secondary | ICD-10-CM | POA: Insufficient documentation

## 2022-06-14 DIAGNOSIS — R252 Cramp and spasm: Secondary | ICD-10-CM | POA: Diagnosis not present

## 2022-06-14 DIAGNOSIS — M6281 Muscle weakness (generalized): Secondary | ICD-10-CM | POA: Diagnosis not present

## 2022-06-14 DIAGNOSIS — M5459 Other low back pain: Secondary | ICD-10-CM | POA: Diagnosis not present

## 2022-06-14 NOTE — Therapy (Signed)
OUTPATIENT PHYSICAL THERAPY THORACOLUMBAR EVALUATION   Patient Name: Joshua [name] MRN: [id number] DOB:February 28, 1951, 71 y.o., male Today's Date: 06/14/2022  END OF SESSION:  PT End of Session - 06/14/22 1638     Visit Number 1    Number of Visits 12    Date for PT Re-Evaluation 07/26/22    Authorization Type Medicare    Progress Note Due on Visit 10    PT Start Time 1607    PT Stop Time 1615    PT Time Calculation (min) 40 min    Activity Tolerance Patient tolerated treatment well    Behavior During Therapy WFL for tasks assessed/performed             Past Medical History:  Diagnosis Date   Asthma    BPH (benign prostatic hyperplasia)    GERD (gastroesophageal reflux disease)    HLD (hyperlipidemia)    HSV infection    Hypertension    Kidney stones    Past Surgical History:  Procedure Laterality Date   APPENDECTOMY     BREAST CYST EXCISION Left    benign   LITHOTRIPSY     TONSILLECTOMY     Patient Active Problem List   Diagnosis Date Noted   Rhinitis 06/09/2020   Healthcare maintenance 06/09/2020   Vitreous floater, bilateral 05/15/2019   Nuclear sclerotic cataract of both eyes 05/15/2019   Precordial chest pain 04/06/2018   Dyspnea on exertion 04/06/2018   Dyslipidemia 04/06/2018   BPH with urinary obstruction 03/31/2018   Essential hypertension 01/17/2018   Solitary lung nodule 09/07/2017   Sinusitis, chronic/Cough 07/22/2016   Chronic asthma, mild persistent, uncomplicated 37/03/6268   Upper airway cough syndrome 06/29/2016    PCP: Copland, Gay Filler, MD    REFERRING PROVIDER: Darreld Mclean, MD   REFERRING DIAG: M54.50,G89.29 (ICD-10-CM) - Chronic right-sided low back pain without sciatica  Rationale for Evaluation and Treatment: Rehabilitation  THERAPY DIAG:  Cramp and spasm  Other low back pain  Muscle weakness (generalized)  ONSET DATE: chronic  SUBJECTIVE:                                                                                                                                                                                            SUBJECTIVE STATEMENT: "Back is on and off, if I do heavy work it increases.  My arm also hurts, but it is getting better, it hurt after driving 12 hours back from thanksgiving.  Plus my elbow, leaning on my elbow on desk, it starts hurting.   Pain in right side of my back today because I set up a christmas tree.  I didn't want to climb on any stairs because my balance isn't so good.  Activity gives me the pain, then the pain lingers."  He was being seen for PT before (last seen 03/09/2022), but went to [place] and got sick, so had to get new referral to come back.    PERTINENT HISTORY:  HTN, asthma, dyspnea on exertion, precordial chest pain, hard of hearing, lumbar spondylosis  PAIN:  Are you having pain? Yes: NPRS scale: 5-6/10 Pain location: R side low back  Pain description: sharp Aggravating factors: lifting and activity  Relieving factors: heat  PRECAUTIONS: None  WEIGHT BEARING RESTRICTIONS: No  FALLS:  Has patient fallen in last 6 months? 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   NEXT MD VISIT: none scheduled  OBJECTIVE:   DIAGNOSTIC FINDINGS:  DG Lumbar Spine Complete Result Date: 12/25/2021 FINDINGS: No recent fracture is seen. Alignment of posterior margins of vertebral bodies is within normal limits. Degenerative changes are noted with anterior bony spurs and facet hypertrophy, more so at L4-L5 and L5-S1 levels. There is mild narrowing of disc space at L4-L5 level. There is interval progression of  degenerative changes. IMPRESSION: No recent fracture is seen in the lumbar spine. Lumbar spondylosis, more so at L4-L5 and L5-S1 levels with interval progression.  PATIENT SURVEYS:  Modified Oswestry 19/50 = 38% moderate disability   SCREENING FOR RED FLAGS: Bowel or bladder incontinence: No Spinal tumors: No Cauda equina syndrome: No Compression fracture: No Abdominal aneurysm: No  COGNITION: Overall cognitive status: Within functional limits for tasks assessed     SENSATION: WFL  MUSCLE LENGTH: Hamstrings: Right ~75 deg; Left ~75 deg   POSTURE: rounded shoulders and forward head  PALPATION: Tenderness primarily R QL, R lumbar paraspinals, some tenderness with PA mobs to L3-5  LUMBAR ROM:   AROM eval  Flexion WNL  Extension WNL  Right lateral flexion WNL  Left lateral flexion Mid thigh, Inc pain  Right rotation WNL  Left rotation WNL   (Blank rows = not tested)  LOWER EXTREMITY ROM:     Active  Right eval Left eval  Hip internal rotation 12 20  Hip external rotation 40 40   (Blank rows = not tested)  LOWER EXTREMITY MMT:    MMT Right eval Left eval  Hip flexion 5 4+  Hip extension 4* 4  Hip abduction 4+ 4+  Hip adduction 5 4+  Knee flexion 5 5  Knee extension 5 5  Ankle dorsiflexion    Ankle plantarflexion     (Blank rows = not tested)  LUMBAR SPECIAL TESTS:  Straight leg raise test: Negative, Ely's Test postive bil for femoral nerve tension.   FUNCTIONAL TESTS:  5 times sit to stand: 14 seconds, no UE assist  GAIT: Distance walked: 13' Assistive device utilized: None Level of assistance: Complete Independence Comments: no significant deviation or device.   TODAY'S TREATMENT:  DATE:   06/14/2022  Therapeutic Exercise: to improve strength and mobility.  Demo, verbal and tactile cues throughout for technique. LTR x  10 Hip IR/ER x 10  Open books x 10 bil - pain laying on Right hip, to do other side Prone knee bends x 10    PATIENT EDUCATION:  Education details: education on findings, POC, importance of regular exercise, initial HEP Person educated: Patient Education method: Consulting civil engineer, Demonstration, Verbal cues, and Handouts Education comprehension: verbalized understanding and returned demonstration  HOME EXERCISE PROGRAM: Access Code: [email or web address] URL: [email or web address] Date: 06/14/2022 Prepared by: Glenetta Hew  Exercises - Supine Lower Trunk Rotation  - 1 x daily - 7 x weekly - 1 sets - 10 reps - Sidelying Open Book Thoracic Lumbar Rotation and Extension  - 1 x daily - 7 x weekly - 1 sets - 10 reps - Supine Hip Internal and External Rotation  - 1 x daily - 7 x weekly - 1 sets - 10 reps - Prone Knee Flexion  - 1 x daily - 7 x weekly - 1 sets - 10 reps  ASSESSMENT:  CLINICAL IMPRESSION: Patient is a 71 y.o. right hand dominant male who was seen today for physical therapy evaluation and treatment for chronic right sided low back pain.  He has been in PT previously for same condition, but has not been performing HEP.  He demonstrates pain primarily in R QL and R sided lumbar erector spinae, decreased R hip ROM, and decreased tolerance to activity.  On modified Oswestry he reports 38% moderate disability due to low back pain.  Discussed today importance of developing a routine with exercise to maintain strength.  While he denies falls, he reports feeling balance is "not the best" so he would also benefit from balance training and possibly OTAGO exercise program.  He does report some L elbow pain but this appears due to position and recommended not leaning on elbow or using a pillow under it.  Joshua [name] would benefit from skilled physical therapy to improve activity tolerance, functional strength and decrease pain in order to improve QOL.  .   OBJECTIVE IMPAIRMENTS: decreased  activity tolerance, decreased endurance, decreased ROM, decreased strength, hypomobility, increased fascial restrictions, impaired perceived functional ability, increased muscle spasms, impaired sensation, postural dysfunction, and pain.   ACTIVITY LIMITATIONS: carrying, lifting, bending, standing, sleeping, bed mobility, and locomotion level  PARTICIPATION LIMITATIONS: meal prep, cleaning, laundry, community activity, and occupation  PERSONAL FACTORS: Time since onset of injury/illness/exacerbation and 3+ comorbidities: HTN, asthma, dyspnea on exertion, precordial chest pain, hard of hearing, lumbar spondylosis  are also affecting patient's functional outcome.   REHAB POTENTIAL: Good  CLINICAL DECISION MAKING: Stable/uncomplicated  EVALUATION COMPLEXITY: Low   GOALS: Goals reviewed with patient? Yes  SHORT TERM GOALS: Target date: 06/28/2022   Patient will be independent with initial HEP.  Baseline: given Goal status: INITIAL   LONG TERM GOALS: Target date: 07/26/2022    Patient will be independent with advanced/ongoing HEP to improve outcomes and carryover.  Baseline: not consistent Goal status: INITIAL  2.  Patient will report 75% improvement in low back pain to improve QOL.  Baseline:  Goal status: INITIAL  3.  Patient will demonstrate full pain free lumbar ROM to perform ADLs.   Baseline: pain in R side with L side bend Goal status: INITIAL  4.  Patient will demonstrate improved functional strength as demonstrated by 5/5 bil hip strength. Baseline: see objective Goal status: INITIAL  5.  Patient will report  26% or less on modified Oswestry to demonstrate improved functional ability.  Baseline: 38% impairment Goal status: INITIAL   6.  Patient will be able to perform all desired activities without increased low back pain.  Baseline: any "heavy activity" or lifting increases pain Goal status: INITIAL  PLAN:  PT FREQUENCY: 1-2x/week  PT DURATION: 6  weeks  PLANNED INTERVENTIONS: Therapeutic exercises, Therapeutic activity, Neuromuscular re-education, Balance training, Gait training, Patient/Family education, Self Care, Joint mobilization, Dry Needling, Electrical stimulation, Spinal mobilization, Cryotherapy, Moist heat, Ultrasound, Manual therapy, and Re-evaluation.  PLAN FOR NEXT SESSION: progress core strengthening, hip mobility as tolerated, manual therapy and modalities PRN.  Not interested in dry needling.  May give OTAGO exercises for routine for balance as well.    Rennie Natter, PT, DPT  06/14/2022, 4:57 PM

## 2022-06-15 ENCOUNTER — Ambulatory Visit: Payer: Medicare Other | Admitting: Physical Therapy

## 2022-06-17 ENCOUNTER — Ambulatory Visit: Payer: Medicare Other

## 2022-06-17 DIAGNOSIS — G8929 Other chronic pain: Secondary | ICD-10-CM | POA: Diagnosis not present

## 2022-06-17 DIAGNOSIS — M6281 Muscle weakness (generalized): Secondary | ICD-10-CM

## 2022-06-17 DIAGNOSIS — M5459 Other low back pain: Secondary | ICD-10-CM | POA: Diagnosis not present

## 2022-06-17 DIAGNOSIS — R252 Cramp and spasm: Secondary | ICD-10-CM | POA: Diagnosis not present

## 2022-06-17 DIAGNOSIS — M5441 Lumbago with sciatica, right side: Secondary | ICD-10-CM | POA: Diagnosis not present

## 2022-06-17 DIAGNOSIS — M5442 Lumbago with sciatica, left side: Secondary | ICD-10-CM | POA: Diagnosis not present

## 2022-06-17 NOTE — Therapy (Signed)
OUTPATIENT PHYSICAL THERAPY TREATMENT   Patient Name: Joshua [name] MRN: [id number] DOB:08-16-1950, 71 y.o., male Today's Date: 06/17/2022  END OF SESSION:  PT End of Session - 06/17/22 1537     Visit Number 2    Number of Visits 12    Date for PT Re-Evaluation 07/26/22    Authorization Type Medicare    Progress Note Due on Visit 10    PT Start Time 1450    PT Stop Time 1530    PT Time Calculation (min) 40 min    Activity Tolerance Patient tolerated treatment well    Behavior During Therapy WFL for tasks assessed/performed              Past Medical History:  Diagnosis Date   Asthma    BPH (benign prostatic hyperplasia)    GERD (gastroesophageal reflux disease)    HLD (hyperlipidemia)    HSV infection    Hypertension    Kidney stones    Past Surgical History:  Procedure Laterality Date   APPENDECTOMY     BREAST CYST EXCISION Left    benign   LITHOTRIPSY     TONSILLECTOMY     Patient Active Problem List   Diagnosis Date Noted   Rhinitis 06/09/2020   Healthcare maintenance 06/09/2020   Vitreous floater, bilateral 05/15/2019   Nuclear sclerotic cataract of both eyes 05/15/2019   Precordial chest pain 04/06/2018   Dyspnea on exertion 04/06/2018   Dyslipidemia 04/06/2018   BPH with urinary obstruction 03/31/2018   Essential hypertension 01/17/2018   Solitary lung nodule 09/07/2017   Sinusitis, chronic/Cough 07/22/2016   Chronic asthma, mild persistent, uncomplicated 24/58/0998   Upper airway cough syndrome 06/29/2016    PCP: Darreld Mclean, MD    REFERRING PROVIDER: Darreld Mclean, MD   REFERRING DIAG: M54.50,G89.29 (ICD-10-CM) - Chronic right-sided low back pain without sciatica  Rationale for Evaluation and Treatment: Rehabilitation  THERAPY DIAG:  Other low back pain  Cramp and spasm  Muscle weakness (generalized)  Chronic right-sided low back pain with right-sided sciatica  Acute right-sided low back pain with right-sided  sciatica  Chronic left-sided low back pain with left-sided sciatica  ONSET DATE: chronic  SUBJECTIVE:                                                                                                                                                                                           SUBJECTIVE STATEMENT: Pt reports he has been doing HEP. Pain not too bad right now, it increases with work or sitting in chair for too long   PERTINENT HISTORY:  HTN, asthma, dyspnea on exertion, precordial  chest pain, hard of hearing, lumbar spondylosis  PAIN:  Are you having pain? Yes: NPRS scale: 4/10 Pain location: R side low back  Pain description: sharp Aggravating factors: lifting and activity  Relieving factors: heat  PRECAUTIONS: None  WEIGHT BEARING RESTRICTIONS: No  FALLS:  Has patient fallen in last 6 months? 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   NEXT MD VISIT: none scheduled  OBJECTIVE:   DIAGNOSTIC FINDINGS:  DG Lumbar Spine Complete Result Date: 12/25/2021 FINDINGS: No recent fracture is seen. Alignment of posterior margins of vertebral bodies is within normal limits. Degenerative changes are noted with anterior bony spurs and facet hypertrophy, more so at L4-L5 and L5-S1 levels. There is mild narrowing of disc space at L4-L5 level. There is interval progression of degenerative changes. IMPRESSION: No recent fracture is seen in the lumbar spine. Lumbar spondylosis, more so at L4-L5 and L5-S1 levels with interval progression.  PATIENT SURVEYS:  Modified Oswestry 19/50 = 38% moderate disability   SCREENING FOR RED  FLAGS: Bowel or bladder incontinence: No Spinal tumors: No Cauda equina syndrome: No Compression fracture: No Abdominal aneurysm: No  COGNITION: Overall cognitive status: Within functional limits for tasks assessed     SENSATION: WFL  MUSCLE LENGTH: Hamstrings: Right ~75 deg; Left ~75 deg   POSTURE: rounded shoulders and forward head  PALPATION: Tenderness primarily R QL, R lumbar paraspinals, some tenderness with PA mobs to L3-5  LUMBAR ROM:   AROM eval  Flexion WNL  Extension WNL  Right lateral flexion WNL  Left lateral flexion Mid thigh, Inc pain  Right rotation WNL  Left rotation WNL   (Blank rows = not tested)  LOWER EXTREMITY ROM:     Active  Right eval Left eval  Hip internal rotation 12 20  Hip external rotation 40 40   (Blank rows = not tested)  LOWER EXTREMITY MMT:    MMT Right eval Left eval  Hip flexion 5 4+  Hip extension 4* 4  Hip abduction 4+ 4+  Hip adduction 5 4+  Knee flexion 5 5  Knee extension 5 5  Ankle dorsiflexion    Ankle plantarflexion     (Blank rows = not tested)  LUMBAR SPECIAL TESTS:  Straight leg raise test: Negative, Ely's Test postive bil for femoral nerve tension.   FUNCTIONAL TESTS:  5 times sit to stand: 14 seconds, no UE assist  GAIT: Distance walked: 33' Assistive device utilized: None Level of assistance: Complete Independence Comments: no significant deviation or device.   TODAY'S TREATMENT:  DATE:   06/17/2022  Therapeutic Exercise: to improve strength and mobility.  Demo, verbal and tactile cues throughout for technique. Rec Bike L2x17mn Supine LTR x 10  Supine ER/IR x 10 5 sec hold Supine ER/ single knee fallout 10x5" each side Supine march with TrA brace x 10 bil Supine bridge with TrA brace x 10   06/14/2022  Therapeutic Exercise: to improve strength and mobility.  Demo,  verbal and tactile cues throughout for technique. LTR x 10 Hip IR/ER x 10  Open books x 10 bil - pain laying on Right hip, to do other side Prone knee bends x 10    PATIENT EDUCATION:  Education details: education on findings, POC, importance of regular exercise, initial HEP Person educated: Patient Education method: EConsulting civil engineer Demonstration, Verbal cues, and Handouts Education comprehension: verbalized understanding and returned demonstration  HOME EXERCISE PROGRAM: Access Code: [email or web address]: [email or web address] Date: 06/14/2022 Prepared by: EGlenetta Hew Exercises - Supine Lower Trunk Rotation  - 1 x daily - 7 x weekly - 1 sets - 10 reps - Sidelying Open Book Thoracic Lumbar Rotation and Extension  - 1 x daily - 7 x weekly - 1 sets - 10 reps - Supine Hip Internal and External Rotation  - 1 x daily - 7 x weekly - 1 sets - 10 reps - Prone Knee Flexion  - 1 x daily - 7 x weekly - 1 sets - 10 reps  ASSESSMENT:  CLINICAL IMPRESSION: Continued with progression of TE to improve bil hip mobility and strength to improve lumbar support. His ER/IR of both hips seemed to improve as the session went on. He had mild pain with LTR going to the R side. Updated HEP to progress more lumbar and glute strengthening focusing on neutral spine. .   OBJECTIVE IMPAIRMENTS: decreased activity tolerance, decreased endurance, decreased ROM, decreased strength, hypomobility, increased fascial restrictions, impaired perceived functional ability, increased muscle spasms, impaired sensation, postural dysfunction, and pain.   ACTIVITY LIMITATIONS: carrying, lifting, bending, standing, sleeping, bed mobility, and locomotion level  PARTICIPATION LIMITATIONS: meal prep, cleaning, laundry, community activity, and occupation  PERSONAL FACTORS: Time since onset of injury/illness/exacerbation and 3+ comorbidities: HTN, asthma, dyspnea on exertion, precordial chest pain, hard of hearing, lumbar  spondylosis  are also affecting patient's functional outcome.   REHAB POTENTIAL: Good  CLINICAL DECISION MAKING: Stable/uncomplicated  EVALUATION COMPLEXITY: Low   GOALS: Goals reviewed with patient? Yes  SHORT TERM GOALS: Target date: 06/28/2022   Patient will be independent with initial HEP.  Baseline: given Goal status: INITIAL   LONG TERM GOALS: Target date: 07/26/2022    Patient will be independent with advanced/ongoing HEP to improve outcomes and carryover.  Baseline: not consistent Goal status: INITIAL  2.  Patient will report 75% improvement in low back pain to improve QOL.  Baseline:  Goal status: INITIAL  3.  Patient will demonstrate full pain free lumbar ROM to perform ADLs.   Baseline: pain in R side with L side bend Goal status: INITIAL  4.  Patient will demonstrate improved functional strength as demonstrated by 5/5 bil hip strength. Baseline: see objective Goal status: INITIAL  5.  Patient will report 26% or less on modified Oswestry to demonstrate improved functional ability.  Baseline: 38% impairment Goal status: INITIAL   6.  Patient will be able to perform all desired activities without increased low back pain.  Baseline: any "heavy activity" or lifting increases pain Goal status: INITIAL  PLAN:  PT FREQUENCY: 1-2x/week  PT DURATION:  6 weeks  PLANNED INTERVENTIONS: Therapeutic exercises, Therapeutic activity, Neuromuscular re-education, Balance training, Gait training, Patient/Family education, Self Care, Joint mobilization, Dry Needling, Electrical stimulation, Spinal mobilization, Cryotherapy, Moist heat, Ultrasound, Manual therapy, and Re-evaluation.  PLAN FOR NEXT SESSION: progress core strengthening, hip mobility as tolerated, manual therapy and modalities PRN.  Not interested in dry needling.  May give OTAGO exercises for routine for balance as well.    Artist Pais, PTA 06/17/2022, 3:38 PM

## 2022-06-23 ENCOUNTER — Ambulatory Visit: Payer: Medicare Other

## 2022-06-23 DIAGNOSIS — M5459 Other low back pain: Secondary | ICD-10-CM

## 2022-06-23 DIAGNOSIS — R252 Cramp and spasm: Secondary | ICD-10-CM | POA: Diagnosis not present

## 2022-06-23 DIAGNOSIS — G8929 Other chronic pain: Secondary | ICD-10-CM | POA: Diagnosis not present

## 2022-06-23 DIAGNOSIS — M6281 Muscle weakness (generalized): Secondary | ICD-10-CM

## 2022-06-23 DIAGNOSIS — M5441 Lumbago with sciatica, right side: Secondary | ICD-10-CM | POA: Diagnosis not present

## 2022-06-23 DIAGNOSIS — M5442 Lumbago with sciatica, left side: Secondary | ICD-10-CM | POA: Diagnosis not present

## 2022-06-23 NOTE — Therapy (Signed)
OUTPATIENT PHYSICAL THERAPY TREATMENT   Patient Name: Joshua [name] MRN: [id number] DOB:[birth year], 71 y.o., male Today's Date: 06/23/2022  END OF SESSION:  PT End of Session - 06/23/22 1801     Visit Number 3    Number of Visits 12    Date for PT Re-Evaluation 07/26/22    Authorization Type Medicare    Progress Note Due on Visit 10    PT Start Time 1705    PT Stop Time 7017    PT Time Calculation (min) 40 min    Activity Tolerance Patient tolerated treatment well    Behavior During Therapy WFL for tasks assessed/performed               Past Medical History:  Diagnosis Date   Asthma    BPH (benign prostatic hyperplasia)    GERD (gastroesophageal reflux disease)    HLD (hyperlipidemia)    HSV infection    Hypertension    Kidney stones    Past Surgical History:  Procedure Laterality Date   APPENDECTOMY     BREAST CYST EXCISION Left    benign   LITHOTRIPSY     TONSILLECTOMY     Patient Active Problem List   Diagnosis Date Noted   Rhinitis 06/09/2020   Healthcare maintenance 06/09/2020   Vitreous floater, bilateral 05/15/2019   Nuclear sclerotic cataract of both eyes 05/15/2019   Precordial chest pain 04/06/2018   Dyspnea on exertion 04/06/2018   Dyslipidemia 04/06/2018   BPH with urinary obstruction 03/31/2018   Essential hypertension 01/17/2018   Solitary lung nodule 09/07/2017   Sinusitis, chronic/Cough 07/22/2016   Chronic asthma, mild persistent, uncomplicated 79/39/0300   Upper airway cough syndrome 06/29/2016    PCP: Darreld Mclean, MD    REFERRING PROVIDER: Darreld Mclean, MD   REFERRING DIAG: M54.50,G89.29 (ICD-10-CM) - Chronic right-sided low back pain without sciatica  Rationale for Evaluation and Treatment: Rehabilitation  THERAPY DIAG:  Other low back pain  Cramp and spasm  Muscle weakness (generalized)  Chronic right-sided low back pain with right-sided sciatica  Acute right-sided low back pain with right-sided  sciatica  Chronic left-sided low back pain with left-sided sciatica  ONSET DATE: chronic  SUBJECTIVE:                                                                                                                                                                                           SUBJECTIVE STATEMENT: Back pain has been better, the L elbow has been bothering him.  PERTINENT HISTORY:  HTN, asthma, dyspnea on exertion, precordial chest pain, hard of hearing, lumbar spondylosis  PAIN:  Are you  having pain? Yes: NPRS scale: 3/10 Pain location: R side low back  Pain description: sharp Aggravating factors: lifting and activity  Relieving factors: heat  PRECAUTIONS: None  WEIGHT BEARING RESTRICTIONS: No  FALLS:  Has patient fallen in last 6 months? 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   NEXT MD VISIT: none scheduled  OBJECTIVE:   DIAGNOSTIC FINDINGS:  DG Lumbar Spine Complete Result Date: 12/25/2021 FINDINGS: No recent fracture is seen. Alignment of posterior margins of vertebral bodies is within normal limits. Degenerative changes are noted with anterior bony spurs and facet hypertrophy, more so at L4-L5 and L5-S1 levels. There is mild narrowing of disc space at L4-L5 level. There is interval progression of degenerative changes. IMPRESSION: No recent fracture is seen in the lumbar spine. Lumbar spondylosis, more so at L4-L5 and L5-S1 levels with interval progression.  PATIENT SURVEYS:  Modified Oswestry 19/50 = 38% moderate disability   SCREENING FOR RED FLAGS: Bowel or bladder incontinence: No Spinal  tumors: No Cauda equina syndrome: No Compression fracture: No Abdominal aneurysm: No  COGNITION: Overall cognitive status: Within functional limits for tasks assessed     SENSATION: WFL  MUSCLE LENGTH: Hamstrings: Right ~75 deg; Left ~75 deg   POSTURE: rounded shoulders and forward head  PALPATION: Tenderness primarily R QL, R lumbar paraspinals, some tenderness with PA mobs to L3-5  LUMBAR ROM:   AROM eval  Flexion WNL  Extension WNL  Right lateral flexion WNL  Left lateral flexion Mid thigh, Inc pain  Right rotation WNL  Left rotation WNL   (Blank rows = not tested)  LOWER EXTREMITY ROM:     Active  Right eval Left eval  Hip internal rotation 12 20  Hip external rotation 40 40   (Blank rows = not tested)  LOWER EXTREMITY MMT:    MMT Right eval Left eval  Hip flexion 5 4+  Hip extension 4* 4  Hip abduction 4+ 4+  Hip adduction 5 4+  Knee flexion 5 5  Knee extension 5 5  Ankle dorsiflexion    Ankle plantarflexion     (Blank rows = not tested)  LUMBAR SPECIAL TESTS:  Straight leg raise test: Negative, Ely's Test postive bil for femoral nerve tension.   FUNCTIONAL TESTS:  5 times sit to stand: 14 seconds, no UE assist  GAIT: Distance walked: 90' Assistive device utilized: None Level of assistance: Complete Independence Comments: no significant deviation or device.   TODAY'S TREATMENT:  DATE:   06/23/22 Therapeutic Exercise: to improve strength and mobility.  Demo, verbal and tactile cues throughout for technique. Rec Bike L2x59mn Supine ER/IR x 10 bil Supine ER/single leg fallout 10x5" bil Supine march with TrA x 10 Supine bridge with TrA x 10  Supine hamstring stretch with strap 2x30 sec bil Seated lat pull 20# 2x10 Seated row 20# 2x10 Knee flexion 10# 2x10 BLE Knee extension 5# x 10 BLE  06/17/2022  Therapeutic  Exercise: to improve strength and mobility.  Demo, verbal and tactile cues throughout for technique. Rec Bike L2x57m Supine LTR x 10  Supine ER/IR x 10 5 sec hold Supine ER/ single knee fallout 10x5" each side Supine march with TrA brace x 10 bil Supine bridge with TrA brace x 10   06/14/2022  Therapeutic Exercise: to improve strength and mobility.  Demo, verbal and tactile cues throughout for technique. LTR x 10 Hip IR/ER x 10  Open books x 10 bil - pain laying on Right hip, to do other side Prone knee bends x 10    PATIENT EDUCATION:  Education details: education on findings, POC, importance of regular exercise, initial HEP Person educated: Patient Education method: ExConsulting civil engineerDemonstration, Verbal cues, and Handouts Education comprehension: verbalized understanding and returned demonstration  HOME EXERCISE PROGRAM: Access Code: [email or web address]: [email or web address] Date: 06/14/2022 Prepared by: ElGlenetta HewExercises - Supine Lower Trunk Rotation  - 1 x daily - 7 x weekly - 1 sets - 10 reps - Sidelying Open Book Thoracic Lumbar Rotation and Extension  - 1 x daily - 7 x weekly - 1 sets - 10 reps - Supine Hip Internal and External Rotation  - 1 x daily - 7 x weekly - 1 sets - 10 reps - Prone Knee Flexion  - 1 x daily - 7 x weekly - 1 sets - 10 reps  ASSESSMENT:  CLINICAL IMPRESSION: Progressed through strengthening exercises today to improve lumbar stability and support. Pt was able to participate in all interventions, he noted mild pain in his L arm with the rows and difficulty with the leg curls today. Provided cues with all interventions to target the correct muscles and to prevent compensations. .   OBJECTIVE IMPAIRMENTS: decreased activity tolerance, decreased endurance, decreased ROM, decreased strength, hypomobility, increased fascial restrictions, impaired perceived functional ability, increased muscle spasms, impaired sensation, postural  dysfunction, and pain.   ACTIVITY LIMITATIONS: carrying, lifting, bending, standing, sleeping, bed mobility, and locomotion level  PARTICIPATION LIMITATIONS: meal prep, cleaning, laundry, community activity, and occupation  PERSONAL FACTORS: Time since onset of injury/illness/exacerbation and 3+ comorbidities: HTN, asthma, dyspnea on exertion, precordial chest pain, hard of hearing, lumbar spondylosis  are also affecting patient's functional outcome.   REHAB POTENTIAL: Good  CLINICAL DECISION MAKING: Stable/uncomplicated  EVALUATION COMPLEXITY: Low   GOALS: Goals reviewed with patient? Yes  SHORT TERM GOALS: Target date: 06/28/2022   Patient will be independent with initial HEP.  Baseline: given Goal status: IN PROGRESS   LONG TERM GOALS: Target date: 07/26/2022    Patient will be independent with advanced/ongoing HEP to improve outcomes and carryover.  Baseline: not consistent Goal status: IN PROGRESS  2.  Patient will report 75% improvement in low back pain to improve QOL.  Baseline:  Goal status: IN PROGRESS  3.  Patient will demonstrate full pain free lumbar ROM to perform ADLs.   Baseline: pain in R side with L side bend Goal status: IN PROGRESS  4.  Patient will demonstrate improved functional strength as  demonstrated by 5/5 bil hip strength. Baseline: see objective Goal status: IN PROGRESS  5.  Patient will report 26% or less on modified Oswestry to demonstrate improved functional ability.  Baseline: 38% impairment Goal status: IN PROGRESS   6.  Patient will be able to perform all desired activities without increased low back pain.  Baseline: any "heavy activity" or lifting increases pain Goal status: IN PROGRESS  PLAN:  PT FREQUENCY: 1-2x/week  PT DURATION: 6 weeks  PLANNED INTERVENTIONS: Therapeutic exercises, Therapeutic activity, Neuromuscular re-education, Balance training, Gait training, Patient/Family education, Self Care, Joint mobilization, Dry  Needling, Electrical stimulation, Spinal mobilization, Cryotherapy, Moist heat, Ultrasound, Manual therapy, and Re-evaluation.  PLAN FOR NEXT SESSION: progress core strengthening, hip mobility as tolerated, manual therapy and modalities PRN.  Not interested in dry needling.  May give OTAGO exercises for routine for balance as well.    Artist Pais, PTA 06/23/2022, 6:01 PM

## 2022-06-29 ENCOUNTER — Ambulatory Visit: Payer: Medicare Other | Attending: Family Medicine | Admitting: Physical Therapy

## 2022-06-29 ENCOUNTER — Encounter: Payer: Self-pay | Admitting: Physical Therapy

## 2022-06-29 DIAGNOSIS — G8929 Other chronic pain: Secondary | ICD-10-CM | POA: Insufficient documentation

## 2022-06-29 DIAGNOSIS — M5441 Lumbago with sciatica, right side: Secondary | ICD-10-CM | POA: Insufficient documentation

## 2022-06-29 DIAGNOSIS — M6281 Muscle weakness (generalized): Secondary | ICD-10-CM | POA: Insufficient documentation

## 2022-06-29 DIAGNOSIS — R252 Cramp and spasm: Secondary | ICD-10-CM | POA: Insufficient documentation

## 2022-06-29 DIAGNOSIS — M5459 Other low back pain: Secondary | ICD-10-CM | POA: Insufficient documentation

## 2022-06-29 NOTE — Therapy (Signed)
OUTPATIENT PHYSICAL THERAPY TREATMENT   Patient Name: Joshua [name] MRN: [id number] DOB:[birth year], 72 y.o., male Today's Date: 06/29/2022  END OF SESSION:  PT End of Session - 06/29/22 1449     Visit Number 4    Number of Visits 12    Date for PT Re-Evaluation 07/26/22    Authorization Type Medicare    Progress Note Due on Visit 10    PT Start Time 1447    PT Stop Time 1532    PT Time Calculation (min) 45 min    Activity Tolerance Patient tolerated treatment well    Behavior During Therapy WFL for tasks assessed/performed               Past Medical History:  Diagnosis Date   Asthma    BPH (benign prostatic hyperplasia)    GERD (gastroesophageal reflux disease)    HLD (hyperlipidemia)    HSV infection    Hypertension    Kidney stones    Past Surgical History:  Procedure Laterality Date   APPENDECTOMY     BREAST CYST EXCISION Left    benign   LITHOTRIPSY     TONSILLECTOMY     Patient Active Problem List   Diagnosis Date Noted   Rhinitis 06/09/2020   Healthcare maintenance 06/09/2020   Vitreous floater, bilateral 05/15/2019   Nuclear sclerotic cataract of both eyes 05/15/2019   Precordial chest pain 04/06/2018   Dyspnea on exertion 04/06/2018   Dyslipidemia 04/06/2018   BPH with urinary obstruction 03/31/2018   Essential hypertension 01/17/2018   Solitary lung nodule 09/07/2017   Sinusitis, chronic/Cough 07/22/2016   Chronic asthma, mild persistent, uncomplicated 96/78/9381   Upper airway cough syndrome 06/29/2016    PCP: Copland, Gay Filler, MD    REFERRING PROVIDER: Darreld Mclean, MD   REFERRING DIAG: M54.50,G89.29 (ICD-10-CM) - Chronic right-sided low back pain without sciatica  Rationale for Evaluation and Treatment: Rehabilitation  THERAPY DIAG:  Other low back pain  Cramp and spasm  Muscle weakness (generalized)  ONSET DATE: chronic  SUBJECTIVE:                                                                                                                                                                                            SUBJECTIVE STATEMENT: Back pain hurts today.    PERTINENT HISTORY:  HTN, asthma, dyspnea on exertion, precordial chest pain, hard of hearing, lumbar spondylosis  PAIN:  Are you having pain? Yes: NPRS scale: 3-4/10 Pain location: R side low back  Pain description: sharp Aggravating factors: lifting and activity  Relieving factors: heat  PRECAUTIONS: None  WEIGHT BEARING RESTRICTIONS: No  FALLS:  Has patient fallen in last 6 months? 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   NEXT MD VISIT: none scheduled  OBJECTIVE:   DIAGNOSTIC FINDINGS:  DG Lumbar Spine Complete Result Date: 12/25/2021 FINDINGS: No recent fracture is seen. Alignment of posterior margins of vertebral bodies is within normal limits. Degenerative changes are noted with anterior bony spurs and facet hypertrophy, more so at L4-L5 and L5-S1 levels. There is mild narrowing of disc space at L4-L5 level. There is interval progression of degenerative changes. IMPRESSION: No recent fracture is seen in the lumbar spine. Lumbar spondylosis, more so at L4-L5 and L5-S1 levels with interval progression.  PATIENT SURVEYS:  Modified Oswestry 19/50 = 38% moderate disability   SCREENING FOR RED FLAGS: Bowel or bladder incontinence: No Spinal tumors: No Cauda equina syndrome: No Compression fracture: No Abdominal aneurysm: No  COGNITION: Overall cognitive status: Within functional limits for tasks assessed     SENSATION: WFL  MUSCLE LENGTH: Hamstrings: Right ~75  deg; Left ~75 deg   POSTURE: rounded shoulders and forward head  PALPATION: Tenderness primarily R QL, R lumbar paraspinals, some tenderness with PA mobs to L3-5  LUMBAR ROM:   AROM eval  Flexion WNL  Extension WNL  Right lateral flexion WNL  Left lateral flexion Mid thigh, Inc pain  Right rotation WNL  Left rotation WNL   (Blank rows = not tested)  LOWER EXTREMITY ROM:     Active  Right eval Left eval  Hip internal rotation 12 20  Hip external rotation 40 40   (Blank rows = not tested)  LOWER EXTREMITY MMT:    MMT Right eval Left eval  Hip flexion 5 4+  Hip extension 4* 4  Hip abduction 4+ 4+  Hip adduction 5 4+  Knee flexion 5 5  Knee extension 5 5  Ankle dorsiflexion    Ankle plantarflexion     (Blank rows = not tested)  LUMBAR SPECIAL TESTS:  Straight leg raise test: Negative, Ely's Test postive bil for femoral nerve tension.   FUNCTIONAL TESTS:  5 times sit to stand: 14 seconds, no UE assist  GAIT: Distance walked: 75' Assistive device utilized: None Level of assistance: Complete Independence Comments: no significant deviation or device.   TODAY'S TREATMENT:                                                                                                                              DATE:  06/29/2022 Therapeutic Exercise: to improve strength and mobility.  Demo, verbal and tactile cues throughout for technique. Nustep L5 x 6 min  At counter:  Heel raises x 15 Hip extension x 10 bil  Hip abduction x 10 bil  Hip flexion x 10 bil  Forward t's x 10 bil  Seated step overs (hip IR/flexion/ER) x 10 bil  Seated hamstring stretch x 30 sec bil Gait x 1000' Education throughout on incorporating exercise into daily routine.   06/23/22 Therapeutic Exercise: to improve strength and mobility.  Demo, verbal and tactile cues throughout for technique. Rec Bike L2x59mn Supine ER/IR x 10 bil Supine ER/single leg fallout 10x5" bil Supine march with TrA x  10 Supine bridge with TrA x 10  Supine hamstring stretch with strap 2x30 sec bil Seated lat pull 20# 2x10 Seated row 20# 2x10 Knee flexion 10# 2x10 BLE Knee extension 5# x 10 BLE  06/17/2022  Therapeutic Exercise: to improve strength and mobility.  Demo, verbal and tactile cues throughout for technique. Rec Bike L2x522m Supine LTR x 10  Supine ER/IR x 10 5 sec hold Supine ER/ single knee fallout 10x5" each side Supine march with TrA brace x 10 bil Supine bridge with TrA brace x 10    PATIENT EDUCATION:  Education details: HEP update 06/29/22 Person educated: Patient Education method: Explanation, Demonstration, Verbal cues, and Handouts Education comprehension: verbalized understanding and returned demonstration  HOME EXERCISE PROGRAM: Access Code: [email or web address]: [email or web address] Date: 06/29/2022 Prepared by: ElChinookith Counter Support  - 1 x daily - 7 x weekly - 2 sets - 10 reps - Standing Hip Abduction with Counter Support  - 1 x daily - 7 x weekly - 2 sets - 10 reps - Standing Hip Extension with Counter Support  - 1 x daily - 7 x weekly - 2 sets - 10 reps - Standing Hip Flexion with Counter Support  - 1 x daily - 7 x weekly - 2 sets - 10 reps - Forward T with Counter Support  - 1 x daily - 7 x weekly - 2 sets - 10 reps - Standing Lumbar Extension with Counter  - 1 x daily - 7 x weekly - 2 sets - 10 reps - Standing Thoracic Open Book [place]- 1 x daily - 7 x weekly - 2 sets - 10 reps - Seated Hamstring Stretch  - 1 x daily - 7 x weekly - 1 sets - 3 reps - 30 sec  hold - Seated Marching with Weight Shift  - 1 x daily - 7 x weekly - 3 sets - 10 reps  ASSESSMENT:  CLINICAL IMPRESSION: TuThiago Ragsdaleeports not consistent with exercise, likes some of his exercises, like the rotational ones, but feels pressured in mornings and would like exercises to perform at lunch or in early evening before dinner, so focused today on  standing and seated exercises.  Also discussed walking, even taking short 10 min walk on warm afternoon would be beneficial, can perform exercises on colder days, and break up exercises as needed.  TuMischa Pollardontinues to demonstrate potential for improvement and would benefit from continued skilled therapy to address impairments.    OBJECTIVE IMPAIRMENTS: decreased activity tolerance, decreased endurance, decreased ROM, decreased strength, hypomobility, increased fascial restrictions, impaired perceived functional ability, increased muscle spasms, impaired sensation, postural dysfunction, and pain.   ACTIVITY LIMITATIONS: carrying, lifting, bending, standing, sleeping, bed mobility, and locomotion level  PARTICIPATION LIMITATIONS: meal prep, cleaning, laundry, community activity, and occupation  PERSONAL FACTORS: Time since onset of injury/illness/exacerbation and 3+ comorbidities: HTN, asthma, dyspnea on exertion, precordial chest  pain, hard of hearing, lumbar spondylosis  are also affecting patient's functional outcome.   REHAB POTENTIAL: Good  CLINICAL DECISION MAKING: Stable/uncomplicated  EVALUATION COMPLEXITY: Low   GOALS: Goals reviewed with patient? Yes  SHORT TERM GOALS: Target date: 06/28/2022   Patient will be independent with initial HEP.  Baseline: given Goal status: IN PROGRESS   LONG TERM GOALS: Target date: 07/26/2022    Patient will be independent with advanced/ongoing HEP to improve outcomes and carryover.  Baseline: not consistent Goal status: IN PROGRESS  2.  Patient will report 75% improvement in low back pain to improve QOL.  Baseline:  Goal status: IN PROGRESS  3.  Patient will demonstrate full pain free lumbar ROM to perform ADLs.   Baseline: pain in R side with L side bend Goal status: IN PROGRESS  4.  Patient will demonstrate improved functional strength as demonstrated by 5/5 bil hip strength. Baseline: see objective Goal status: IN PROGRESS  5.   Patient will report 26% or less on modified Oswestry to demonstrate improved functional ability.  Baseline: 38% impairment Goal status: IN PROGRESS   6.  Patient will be able to perform all desired activities without increased low back pain.  Baseline: any "heavy activity" or lifting increases pain Goal status: IN PROGRESS  PLAN:  PT FREQUENCY: 1-2x/week  PT DURATION: 6 weeks  PLANNED INTERVENTIONS: Therapeutic exercises, Therapeutic activity, Neuromuscular re-education, Balance training, Gait training, Patient/Family education, Self Care, Joint mobilization, Dry Needling, Electrical stimulation, Spinal mobilization, Cryotherapy, Moist heat, Ultrasound, Manual therapy, and Re-evaluation.  PLAN FOR NEXT SESSION: progress core strengthening, hip mobility as tolerated, manual therapy and modalities PRN.  Not interested in dry needling.  May give OTAGO exercises for routine for balance as well.    Rennie Natter, PT, DPT  06/29/2022, 3:40 PM

## 2022-07-01 ENCOUNTER — Ambulatory Visit: Payer: Medicare Other

## 2022-07-01 DIAGNOSIS — M6281 Muscle weakness (generalized): Secondary | ICD-10-CM | POA: Diagnosis not present

## 2022-07-01 DIAGNOSIS — M5459 Other low back pain: Secondary | ICD-10-CM | POA: Diagnosis not present

## 2022-07-01 DIAGNOSIS — R252 Cramp and spasm: Secondary | ICD-10-CM

## 2022-07-01 DIAGNOSIS — M5441 Lumbago with sciatica, right side: Secondary | ICD-10-CM | POA: Diagnosis not present

## 2022-07-01 DIAGNOSIS — G8929 Other chronic pain: Secondary | ICD-10-CM

## 2022-07-01 NOTE — Therapy (Signed)
OUTPATIENT PHYSICAL THERAPY TREATMENT   Patient Name: Derreon Consalvo MRN: [id number] DOB:[birth year], 72 y.o., male Today's Date: 07/01/2022  END OF SESSION:  PT End of Session - 07/01/22 1542     Visit Number 5    Number of Visits 12    Date for PT Re-Evaluation 07/26/22    Authorization Type Medicare    Progress Note Due on Visit 10    PT Start Time 1445    PT Stop Time 1532    PT Time Calculation (min) 47 min    Activity Tolerance Patient tolerated treatment well    Behavior During Therapy WFL for tasks assessed/performed               Past Medical History:  Diagnosis Date   Asthma    BPH (benign prostatic hyperplasia)    GERD (gastroesophageal reflux disease)    HLD (hyperlipidemia)    HSV infection    Hypertension    Kidney stones    Past Surgical History:  Procedure Laterality Date   APPENDECTOMY     BREAST CYST EXCISION Left    benign   LITHOTRIPSY     TONSILLECTOMY     Patient Active Problem List   Diagnosis Date Noted   Rhinitis 06/09/2020   Healthcare maintenance 06/09/2020   Vitreous floater, bilateral 05/15/2019   Nuclear sclerotic cataract of both eyes 05/15/2019   Precordial chest pain 04/06/2018   Dyspnea on exertion 04/06/2018   Dyslipidemia 04/06/2018   BPH with urinary obstruction 03/31/2018   Essential hypertension 01/17/2018   Solitary lung nodule 09/07/2017   Sinusitis, chronic/Cough 07/22/2016   Chronic asthma, mild persistent, uncomplicated 65/68/1275   Upper airway cough syndrome 06/29/2016    PCP: Darreld Mclean, MD    REFERRING PROVIDER: Darreld Mclean, MD   REFERRING DIAG: M54.50,G89.29 (ICD-10-CM) - Chronic right-sided low back pain without sciatica  Rationale for Evaluation and Treatment: Rehabilitation  THERAPY DIAG:  Other low back pain  Cramp and spasm  Muscle weakness (generalized)  Chronic right-sided low back pain with right-sided sciatica  Acute right-sided low back pain with right-sided  sciatica  ONSET DATE: chronic  SUBJECTIVE:                                                                                                                                                                                           SUBJECTIVE STATEMENT: No pain in back today only R hip pain.    PERTINENT HISTORY:  HTN, asthma, dyspnea on exertion, precordial chest pain, hard of hearing, lumbar spondylosis  PAIN:  Are you having pain? Yes: NPRS scale: 3/10 Pain location: R side  low back/hip  Pain description: sharp Aggravating factors: lifting and activity  Relieving factors: heat  PRECAUTIONS: None  WEIGHT BEARING RESTRICTIONS: No  FALLS:  Has patient fallen in last 6 months? 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   NEXT MD VISIT: none scheduled  OBJECTIVE:   DIAGNOSTIC FINDINGS:  DG Lumbar Spine Complete Result Date: 12/25/2021 FINDINGS: No recent fracture is seen. Alignment of posterior margins of vertebral bodies is within normal limits. Degenerative changes are noted with anterior bony spurs and facet hypertrophy, more so at L4-L5 and L5-S1 levels. There is mild narrowing of disc space at L4-L5 level. There is interval progression of degenerative changes. IMPRESSION: No recent fracture is seen in the lumbar spine. Lumbar spondylosis, more so at L4-L5 and L5-S1 levels with interval progression.  PATIENT SURVEYS:  Modified Oswestry 19/50 = 38% moderate disability   SCREENING FOR RED FLAGS: Bowel or bladder incontinence: No Spinal tumors: No Cauda equina syndrome: No Compression fracture: No Abdominal aneurysm:  No  COGNITION: Overall cognitive status: Within functional limits for tasks assessed     SENSATION: WFL  MUSCLE LENGTH: Hamstrings: Right ~75 deg; Left ~75 deg   POSTURE: rounded shoulders and forward head  PALPATION: Tenderness primarily R QL, R lumbar paraspinals, some tenderness with PA mobs to L3-5  LUMBAR ROM:   AROM eval  Flexion WNL  Extension WNL  Right lateral flexion WNL  Left lateral flexion Mid thigh, Inc pain  Right rotation WNL  Left rotation WNL   (Blank rows = not tested)  LOWER EXTREMITY ROM:     Active  Right eval Left eval  Hip internal rotation 12 20  Hip external rotation 40 40   (Blank rows = not tested)  LOWER EXTREMITY MMT:    MMT Right eval Left eval  Hip flexion 5 4+  Hip extension 4* 4  Hip abduction 4+ 4+  Hip adduction 5 4+  Knee flexion 5 5  Knee extension 5 5  Ankle dorsiflexion    Ankle plantarflexion     (Blank rows = not tested)  LUMBAR SPECIAL TESTS:  Straight leg raise test: Negative, Ely's Test postive bil for femoral nerve tension.   FUNCTIONAL TESTS:  5 times sit to stand: 14 seconds, no UE assist  GAIT: Distance walked: 26' Assistive device utilized: None Level of assistance: Complete Independence Comments: no significant deviation or device.   TODAY'S TREATMENT:                                                                                                                              DATE:  07/01/22 Therapeutic Exercise: to improve strength and mobility.  Demo, verbal and tactile cues throughout for technique. Recumbent Bike L2x9mn Standing heel raise x 20- fatigue in R hip Standing hip abduction 2x10 bil Standing hip extension 2x10 bil Standing hip flexion 2x10 bil Standing lumbar extension at counter 10x3" bil Seated hamstring stretch 2x30 sec bil Seated ER/modified pigeon pose - painful   PROM to R and L hip ER/IR  06/29/2022 Therapeutic Exercise: to improve strength and mobility.  Demo, verbal  and tactile cues throughout for technique. Nustep L5 x 6 min  At counter: Heel raises x 15 Hip extension x 10 bil  Hip abduction x 10 bil  Hip flexion x 10 bil  Forward t's x 10 bil  Seated step overs (hip IR/flexion/ER) x 10 bil  Seated hamstring stretch x 30 sec bil Gait x 1000' Education throughout on incorporating exercise into daily routine.   06/23/22 Therapeutic Exercise: to improve strength and mobility.  Demo, verbal and tactile cues throughout for technique. Rec Bike L2x651m Supine ER/IR x 10 bil Supine ER/single leg fallout 10x5" bil Supine march with TrA x 10 Supine bridge with TrA x 10  Supine hamstring stretch with strap 2x30 sec bil Seated lat pull 20# 2x10 Seated row 20# 2x10 Knee flexion 10# 2x10 BLE Knee extension 5# x 10 BLE  06/17/2022  Therapeutic Exercise: to improve strength and mobility.  Demo, verbal and tactile cues throughout for technique. Rec Bike L2x5m57mSupine LTR x 10  Supine ER/IR x 10 5 sec hold Supine ER/ single knee fallout 10x5" each side Supine march with TrA brace x 10 bil Supine bridge with TrA brace x 10    PATIENT EDUCATION:  Education details: HEP update 06/29/22 Person educated: Patient Education method: Explanation, Demonstration, Verbal cues, and Handouts Education comprehension: verbalized understanding and returned demonstration  HOME EXERCISE PROGRAM: Access Code: [email or web address]: [email or web address] Date: 06/29/2022 Prepared by: EliSt. Helenath Counter Support  - 1 x daily - 7 x weekly - 2 sets - 10 reps - Standing Hip Abduction with Counter Support  - 1 x daily - 7 x weekly - 2 sets - 10 reps - Standing Hip Extension with Counter Support  - 1 x daily - 7 x weekly - 2 sets - 10 reps - Standing Hip Flexion with Counter Support  - 1 x daily - 7 x weekly - 2 sets - 10 reps - Forward T with Counter Support  - 1 x daily - 7 x weekly - 2 sets - 10 reps - Standing Lumbar  Extension with Counter  - 1 x daily - 7 x weekly - 2 sets - 10 reps - Standing Thoracic Open Book at [employer] daily - 7 x weekly - 2 sets - 10 reps - Seated Hamstring Stretch  - 1 x daily - 7 x weekly - 1 sets - 3 reps - 30 sec  hold - Seated Marching with Weight Shift  - 1 x daily - 7 x weekly - 3 sets - 10 reps  ASSESSMENT:  CLINICAL IMPRESSION: Pt showed a good tolerance for TE as we continued progress exercises. He continues to have most difficulty with R hip ER w/o limitation from pain or ROM. He notes understanding HEP but still yet to find routine to allow him to be more consistent with program. He would continue to benefit from skilled PT to address remaining deficits.  OBJECTIVE IMPAIRMENTS: decreased activity tolerance, decreased  endurance, decreased ROM, decreased strength, hypomobility, increased fascial restrictions, impaired perceived functional ability, increased muscle spasms, impaired sensation, postural dysfunction, and pain.   ACTIVITY LIMITATIONS: carrying, lifting, bending, standing, sleeping, bed mobility, and locomotion level  PARTICIPATION LIMITATIONS: meal prep, cleaning, laundry, community activity, and occupation  PERSONAL FACTORS: Time since onset of injury/illness/exacerbation and 3+ comorbidities: HTN, asthma, dyspnea on exertion, precordial chest pain, hard of hearing, lumbar spondylosis  are also affecting patient's functional outcome.   REHAB POTENTIAL: Good  CLINICAL DECISION MAKING: Stable/uncomplicated  EVALUATION COMPLEXITY: Low   GOALS: Goals reviewed with patient? Yes  SHORT TERM GOALS: Target date: 06/28/2022   Patient will be independent with initial HEP.  Baseline: given Goal status: MET- 07/01/22 - good understanding, poor compliance d/t schedule   LONG TERM GOALS: Target date: 07/26/2022    Patient will be independent with advanced/ongoing HEP to improve outcomes and carryover.  Baseline: not consistent Goal status: IN PROGRESS  2.   Patient will report 75% improvement in low back pain to improve QOL.  Baseline:  Goal status: IN PROGRESS  3.  Patient will demonstrate full pain free lumbar ROM to perform ADLs.   Baseline: pain in R side with L side bend Goal status: IN PROGRESS  4.  Patient will demonstrate improved functional strength as demonstrated by 5/5 bil hip strength. Baseline: see objective Goal status: IN PROGRESS  5.  Patient will report 26% or less on modified Oswestry to demonstrate improved functional ability.  Baseline: 38% impairment Goal status: IN PROGRESS   6.  Patient will be able to perform all desired activities without increased low back pain.  Baseline: any "heavy activity" or lifting increases pain Goal status: IN PROGRESS  PLAN:  PT FREQUENCY: 1-2x/week  PT DURATION: 6 weeks  PLANNED INTERVENTIONS: Therapeutic exercises, Therapeutic activity, Neuromuscular re-education, Balance training, Gait training, Patient/Family education, Self Care, Joint mobilization, Dry Needling, Electrical stimulation, Spinal mobilization, Cryotherapy, Moist heat, Ultrasound, Manual therapy, and Re-evaluation.  PLAN FOR NEXT SESSION: progress core strengthening, hip mobility as tolerated, manual therapy and modalities PRN.  Not interested in dry needling.  May give OTAGO exercises for routine for balance as well.    Artist Pais, PTA 07/01/2022, 3:43 PM

## 2022-07-06 ENCOUNTER — Encounter: Payer: Medicare Other | Admitting: Physical Therapy

## 2022-07-12 ENCOUNTER — Ambulatory Visit: Payer: Medicare Other

## 2022-07-12 DIAGNOSIS — M5441 Lumbago with sciatica, right side: Secondary | ICD-10-CM | POA: Diagnosis not present

## 2022-07-12 DIAGNOSIS — M6281 Muscle weakness (generalized): Secondary | ICD-10-CM

## 2022-07-12 DIAGNOSIS — G8929 Other chronic pain: Secondary | ICD-10-CM | POA: Diagnosis not present

## 2022-07-12 DIAGNOSIS — R252 Cramp and spasm: Secondary | ICD-10-CM | POA: Diagnosis not present

## 2022-07-12 DIAGNOSIS — M5459 Other low back pain: Secondary | ICD-10-CM

## 2022-07-12 NOTE — Therapy (Signed)
OUTPATIENT PHYSICAL THERAPY TREATMENT   Patient Name: Joshua [name] MRN: [id number] DOB:1950/09/05, 72 y.o., male Today's Date: 07/12/2022  END OF SESSION:  PT End of Session - 07/12/22 1428     Visit Number 6    Number of Visits 12    Date for PT Re-Evaluation 07/26/22    Authorization Type Medicare    Progress Note Due on Visit 10    PT Start Time 7510    PT Stop Time 1444    PT Time Calculation (min) 42 min    Activity Tolerance Patient tolerated treatment well    Behavior During Therapy WFL for tasks assessed/performed               Past Medical History:  Diagnosis Date   Asthma    BPH (benign prostatic hyperplasia)    GERD (gastroesophageal reflux disease)    HLD (hyperlipidemia)    HSV infection    Hypertension    Kidney stones    Past Surgical History:  Procedure Laterality Date   APPENDECTOMY     BREAST CYST EXCISION Left    benign   LITHOTRIPSY     TONSILLECTOMY     Patient Active Problem List   Diagnosis Date Noted   Rhinitis 06/09/2020   Healthcare maintenance 06/09/2020   Vitreous floater, bilateral 05/15/2019   Nuclear sclerotic cataract of both eyes 05/15/2019   Precordial chest pain 04/06/2018   Dyspnea on exertion 04/06/2018   Dyslipidemia 04/06/2018   BPH with urinary obstruction 03/31/2018   Essential hypertension 01/17/2018   Solitary lung nodule 09/07/2017   Sinusitis, chronic/Cough 07/22/2016   Chronic asthma, mild persistent, uncomplicated 25/85/2778   Upper airway cough syndrome 06/29/2016    PCP: Darreld Mclean, MD    REFERRING PROVIDER: Darreld Mclean, MD   REFERRING DIAG: M54.50,G89.29 (ICD-10-CM) - Chronic right-sided low back pain without sciatica  Rationale for Evaluation and Treatment: Rehabilitation  THERAPY DIAG:  Other low back pain  Cramp and spasm  Muscle weakness (generalized)  Chronic right-sided low back pain with right-sided sciatica  Acute right-sided low back pain with right-sided  sciatica  ONSET DATE: chronic  SUBJECTIVE:                                                                                                                                                                                           SUBJECTIVE STATEMENT: Pt reports his   PERTINENT HISTORY:  HTN, asthma, dyspnea on exertion, precordial chest pain, hard of hearing, lumbar spondylosis  PAIN:  Are you having pain? Yes: NPRS scale: 3/10 Pain location: R side low back/hip  Pain description: sharp Aggravating  factors: lifting and activity  Relieving factors: heat  PRECAUTIONS: None  WEIGHT BEARING RESTRICTIONS: No  FALLS:  Has patient fallen in last 6 months? 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   NEXT MD VISIT: none scheduled  OBJECTIVE:   DIAGNOSTIC FINDINGS:  DG Lumbar Spine Complete Result Date: 12/25/2021 FINDINGS: No recent fracture is seen. Alignment of posterior margins of vertebral bodies is within normal limits. Degenerative changes are noted with anterior bony spurs and facet hypertrophy, more so at L4-L5 and L5-S1 levels. There is mild narrowing of disc space at L4-L5 level. There is interval progression of degenerative changes. IMPRESSION: No recent fracture is seen in the lumbar spine. Lumbar spondylosis, more so at L4-L5 and L5-S1 levels with interval progression.  PATIENT SURVEYS:  Modified Oswestry 19/50 = 38% moderate disability   SCREENING FOR RED FLAGS: Bowel or bladder incontinence: No Spinal tumors: No Cauda equina syndrome: No Compression fracture: No Abdominal aneurysm:  No  COGNITION: Overall cognitive status: Within functional limits for tasks assessed     SENSATION: WFL  MUSCLE LENGTH: Hamstrings: Right ~75 deg; Left ~75 deg   POSTURE: rounded shoulders and forward head  PALPATION: Tenderness primarily R QL, R lumbar paraspinals, some tenderness with PA mobs to L3-5  LUMBAR ROM:   AROM eval  Flexion WNL  Extension WNL  Right lateral flexion WNL  Left lateral flexion Mid thigh, Inc pain  Right rotation WNL  Left rotation WNL   (Blank rows = not tested)  LOWER EXTREMITY ROM:     Active  Right eval Left eval  Hip internal rotation 12 20  Hip external rotation 40 40   (Blank rows = not tested)  LOWER EXTREMITY MMT:    MMT Right eval Left eval  Hip flexion 5 4+  Hip extension 4* 4  Hip abduction 4+ 4+  Hip adduction 5 4+  Knee flexion 5 5  Knee extension 5 5  Ankle dorsiflexion    Ankle plantarflexion     (Blank rows = not tested)  LUMBAR SPECIAL TESTS:  Straight leg raise test: Negative, Ely's Test postive bil for femoral nerve tension.   FUNCTIONAL TESTS:  5 times sit to stand: 14 seconds, no UE assist  GAIT: Distance walked: 66' Assistive device utilized: None Level of assistance: Complete Independence Comments: no significant deviation or device.   TODAY'S TREATMENT:                                                                                                                              DATE:   07/12/22 Therapeutic Exercise: to improve strength and  mobility.  Demo, verbal and tactile cues throughout for technique. Nustep L5x36mn Standing heel/toe 2x10 each way Standing hip extension 2x10 bil Good mornings with cane x 10  Standing shoulder flexion AROM with cane x 10  Knee flexion 10# 2x10 BLE Knee extension 5# 2x10 BLE Fwd step ups 2x10 6' Seated hip ER/IR AROM in siting 07/01/22 Therapeutic Exercise: to improve strength and mobility.  Demo, verbal and tactile cues throughout for technique. Recumbent  Bike L2x521m Standing heel raise x 20- fatigue in R hip Standing hip abduction 2x10 bil Standing hip extension 2x10 bil Standing hip flexion 2x10 bil Standing lumbar extension at counter 10x3" bil Seated hamstring stretch 2x30 sec bil Seated ER/modified pigeon pose - painful   PROM to R and L hip ER/IR  06/29/2022 Therapeutic Exercise: to improve strength and mobility.  Demo, verbal and tactile cues throughout for technique. Nustep L5 x 6 min  At counter: Heel raises x 15 Hip extension x 10 bil  Hip abduction x 10 bil  Hip flexion x 10 bil  Forward t's x 10 bil  Seated step overs (hip IR/flexion/ER) x 10 bil  Seated hamstring stretch x 30 sec bil Gait x 1000' Education throughout on incorporating exercise into daily routine.   06/23/22 Therapeutic Exercise: to improve strength and mobility.  Demo, verbal and tactile cues throughout for technique. Rec Bike L2x6m33mSupine ER/IR x 10 bil Supine ER/single leg fallout 10x5" bil Supine march with TrA x 10 Supine bridge with TrA x 10  Supine hamstring stretch with strap 2x30 sec bil Seated lat pull 20# 2x10 Seated row 20# 2x10 Knee flexion 10# 2x10 BLE Knee extension 5# x 10 BLE  06/17/2022  Therapeutic Exercise: to improve strength and mobility.  Demo, verbal and tactile cues throughout for technique. Rec Bike L2x5mi79mupine LTR x 10  Supine ER/IR x 10 5 sec hold Supine ER/ single knee fallout 10x5" each side Supine march with TrA brace x 10 bil Supine bridge with TrA brace x 10    PATIENT EDUCATION:  Education details: HEP update 06/29/22 Person educated: Patient Education method: Explanation, Demonstration, Verbal cues, and Handouts Education comprehension: verbalized understanding and returned demonstration  HOME EXERCISE PROGRAM: Access Code: [email or web address]: [email or web address] Date: 06/29/2022 Prepared by: ElizSandovalh Counter Support  - 1 x daily - 7 x weekly - 2  sets - 10 reps - Standing Hip Abduction with Counter Support  - 1 x daily - 7 x weekly - 2 sets - 10 reps - Standing Hip Extension with Counter Support  - 1 x daily - 7 x weekly - 2 sets - 10 reps - Standing Hip Flexion with Counter Support  - 1 x daily - 7 x weekly - 2 sets - 10 reps - Forward T with Counter Support  - 1 x daily - 7 x weekly - 2 sets - 10 reps - Standing Lumbar Extension with Counter  - 1 x daily - 7 x weekly - 2 sets - 10 reps - Standing Thoracic Open Book at [employer] x daily - 7 x weekly - 2 sets - 10 reps - Seated Hamstring Stretch  - 1 x daily - 7 x weekly - 1 sets - 3 reps - 30 sec  hold - Seated Marching with Weight Shift  - 1 x daily - 7 x weekly - 3 sets - 10 reps  ASSESSMENT:  CLINICAL IMPRESSION: Continued progressing exercises to improve lumbar support  and proximal LE strength. He still is having difficulty with hip extensions in standing. He shows weakness in both legs with leg machine and step ups. Pt would continue to benefit from skilled PT and progressing toward LTG #2.  OBJECTIVE IMPAIRMENTS: decreased activity tolerance, decreased endurance, decreased ROM, decreased strength, hypomobility, increased fascial restrictions, impaired perceived functional ability, increased muscle spasms, impaired sensation, postural dysfunction, and pain.   ACTIVITY LIMITATIONS: carrying, lifting, bending, standing, sleeping, bed mobility, and locomotion level  PARTICIPATION LIMITATIONS: meal prep, cleaning, laundry, community activity, and occupation  PERSONAL FACTORS: Time since onset of injury/illness/exacerbation and 3+ comorbidities: HTN, asthma, dyspnea on exertion, precordial chest pain, hard of hearing, lumbar spondylosis  are also affecting patient's functional outcome.   REHAB POTENTIAL: Good  CLINICAL DECISION MAKING: Stable/uncomplicated  EVALUATION COMPLEXITY: Low   GOALS: Goals reviewed with patient? Yes  SHORT TERM GOALS: Target date: 06/28/2022    Patient will be independent with initial HEP.  Baseline: given Goal status: MET- 07/01/22 - good understanding, poor compliance d/t schedule   LONG TERM GOALS: Target date: 07/26/2022    Patient will be independent with advanced/ongoing HEP to improve outcomes and carryover.  Baseline: not consistent Goal status: IN PROGRESS  2.  Patient will report 75% improvement in low back pain to improve QOL.  Baseline:  Goal status: IN PROGRESS - 30-40% improvement  3.  Patient will demonstrate full pain free lumbar ROM to perform ADLs.   Baseline: pain in R side with L side bend Goal status: IN PROGRESS  4.  Patient will demonstrate improved functional strength as demonstrated by 5/5 bil hip strength. Baseline: see objective Goal status: IN PROGRESS  5.  Patient will report 26% or less on modified Oswestry to demonstrate improved functional ability.  Baseline: 38% impairment Goal status: IN PROGRESS   6.  Patient will be able to perform all desired activities without increased low back pain.  Baseline: any "heavy activity" or lifting increases pain Goal status: IN PROGRESS  PLAN:  PT FREQUENCY: 1-2x/week  PT DURATION: 6 weeks  PLANNED INTERVENTIONS: Therapeutic exercises, Therapeutic activity, Neuromuscular re-education, Balance training, Gait training, Patient/Family education, Self Care, Joint mobilization, Dry Needling, Electrical stimulation, Spinal mobilization, Cryotherapy, Moist heat, Ultrasound, Manual therapy, and Re-evaluation.  PLAN FOR NEXT SESSION: progress core strengthening, hip mobility as tolerated, manual therapy and modalities PRN.  Not interested in dry needling.  May give OTAGO exercises for routine for balance as well.    Artist Pais, PTA 07/12/2022, 2:52 PM

## 2022-07-14 ENCOUNTER — Ambulatory Visit: Payer: Medicare Other

## 2022-07-14 DIAGNOSIS — G8929 Other chronic pain: Secondary | ICD-10-CM

## 2022-07-14 DIAGNOSIS — R252 Cramp and spasm: Secondary | ICD-10-CM

## 2022-07-14 DIAGNOSIS — M5441 Lumbago with sciatica, right side: Secondary | ICD-10-CM

## 2022-07-14 DIAGNOSIS — M5459 Other low back pain: Secondary | ICD-10-CM

## 2022-07-14 DIAGNOSIS — M6281 Muscle weakness (generalized): Secondary | ICD-10-CM

## 2022-07-14 NOTE — Therapy (Signed)
OUTPATIENT PHYSICAL THERAPY TREATMENT   Patient Name: Joshua [name] MRN: [id number] DOB:05-09-1951, 72 y.o., male Today's Date: 07/14/2022  END OF SESSION:  PT End of Session - 07/14/22 1630     Visit Number 7    Number of Visits 12    Date for PT Re-Evaluation 07/26/22    Authorization Type Medicare    Progress Note Due on Visit 10    PT Start Time 1537    PT Stop Time 1618    PT Time Calculation (min) 41 min    Activity Tolerance Patient tolerated treatment well    Behavior During Therapy WFL for tasks assessed/performed                Past Medical History:  Diagnosis Date   Asthma    BPH (benign prostatic hyperplasia)    GERD (gastroesophageal reflux disease)    HLD (hyperlipidemia)    HSV infection    Hypertension    Kidney stones    Past Surgical History:  Procedure Laterality Date   APPENDECTOMY     BREAST CYST EXCISION Left    benign   LITHOTRIPSY     TONSILLECTOMY     Patient Active Problem List   Diagnosis Date Noted   Rhinitis 06/09/2020   Healthcare maintenance 06/09/2020   Vitreous floater, bilateral 05/15/2019   Nuclear sclerotic cataract of both eyes 05/15/2019   Precordial chest pain 04/06/2018   Dyspnea on exertion 04/06/2018   Dyslipidemia 04/06/2018   BPH with urinary obstruction 03/31/2018   Essential hypertension 01/17/2018   Solitary lung nodule 09/07/2017   Sinusitis, chronic/Cough 07/22/2016   Chronic asthma, mild persistent, uncomplicated 15/17/6160   Upper airway cough syndrome 06/29/2016    PCP: Darreld Mclean, MD    REFERRING PROVIDER: Darreld Mclean, MD   REFERRING DIAG: M54.50,G89.29 (ICD-10-CM) - Chronic right-sided low back pain without sciatica  Rationale for Evaluation and Treatment: Rehabilitation  THERAPY DIAG:  Other low back pain  Cramp and spasm  Muscle weakness (generalized)  Chronic right-sided low back pain with right-sided sciatica  Acute right-sided low back pain with right-sided  sciatica  ONSET DATE: chronic  SUBJECTIVE:                                                                                                                                                                                           SUBJECTIVE STATEMENT: Pt reports improvement in L elbow pain, however will see orthopedist to evaluate. No increase in low back pain today.   PERTINENT HISTORY:  HTN, asthma, dyspnea on exertion, precordial chest pain, hard of hearing, lumbar spondylosis  PAIN:  Are  you having pain? Yes: NPRS scale: 3/10 Pain location: R side low back/hip  Pain description: sharp Aggravating factors: lifting and activity  Relieving factors: heat  PRECAUTIONS: None  WEIGHT BEARING RESTRICTIONS: No  FALLS:  Has patient fallen in last 6 months? 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   NEXT MD VISIT: none scheduled  OBJECTIVE:   DIAGNOSTIC FINDINGS:  DG Lumbar Spine Complete Result Date: 12/25/2021 FINDINGS: No recent fracture is seen. Alignment of posterior margins of vertebral bodies is within normal limits. Degenerative changes are noted with anterior bony spurs and facet hypertrophy, more so at L4-L5 and L5-S1 levels. There is mild narrowing of disc space at L4-L5 level. There is interval progression of degenerative changes. IMPRESSION: No recent fracture is seen in the lumbar spine. Lumbar spondylosis, more so at L4-L5 and L5-S1 levels with interval progression.  PATIENT SURVEYS:  Modified Oswestry 19/50 = 38% moderate disability   SCREENING FOR RED FLAGS: Bowel or bladder incontinence: No Spinal  tumors: No Cauda equina syndrome: No Compression fracture: No Abdominal aneurysm: No  COGNITION: Overall cognitive status: Within functional limits for tasks assessed     SENSATION: WFL  MUSCLE LENGTH: Hamstrings: Right ~75 deg; Left ~75 deg   POSTURE: rounded shoulders and forward head  PALPATION: Tenderness primarily R QL, R lumbar paraspinals, some tenderness with PA mobs to L3-5  LUMBAR ROM:   AROM eval  Flexion WNL  Extension WNL  Right lateral flexion WNL  Left lateral flexion Mid thigh, Inc pain  Right rotation WNL  Left rotation WNL   (Blank rows = not tested)  LOWER EXTREMITY ROM:     Active  Right eval Left eval  Hip internal rotation 12 20  Hip external rotation 40 40   (Blank rows = not tested)  LOWER EXTREMITY MMT:    MMT Right eval Left eval Right 07/14/22 Left 07/14/22  Hip flexion 5 4+ 5 5  Hip extension 4* 4 4+ - initial cramping in HS but better after STM 5  Hip abduction 4+ 4+ 4+ 4+  Hip adduction 5 4+ 5 5  Knee flexion 5 5    Knee extension 5 5    Ankle dorsiflexion      Ankle plantarflexion       (Blank rows = not tested)  LUMBAR SPECIAL TESTS:  Straight leg raise test: Negative, Ely's Test postive bil for femoral nerve tension.   FUNCTIONAL TESTS:  5 times sit to stand: 14 seconds, no UE assist  GAIT: Distance walked: 13' Assistive device utilized: None Level of assistance: Complete Independence Comments: no significant deviation or device.   TODAY'S TREATMENT:  DATE:   07/14/22 Therapeutic Exercise: to improve strength and mobility.  Demo, verbal and tactile cues throughout for technique. Nustep L5x88mn LE MMT Standing hip hinge with dowel x 10 Functional squat x 10  Standing pallof press blue TB x 10 each way  Standing trunk twist blue TB x 10 each way Standing reverse chop with yellow weighted  ball x 10   Manual Therapy: to decrease muscle spasm and pain and improve mobility  STM and IASTM (rolling stick)  to R/L hamstrings  07/12/22 Therapeutic Exercise: to improve strength and mobility.  Demo, verbal and tactile cues throughout for technique. Nustep L5x689m Standing heel/toe 2x10 each way Standing hip extension 2x10 bil Good mornings with cane x 10  Standing shoulder flexion AROM with cane x 10  Knee flexion 10# 2x10 BLE Knee extension 5# 2x10 BLE Fwd step ups 2x10 6' Seated hip ER/IR AROM in siting 07/01/22 Therapeutic Exercise: to improve strength and mobility.  Demo, verbal and tactile cues throughout for technique. Recumbent Bike [id number] heel raise x 20- fatigue in R hip Standing hip abduction 2x10 bil Standing hip extension 2x10 bil Standing hip flexion 2x10 bil Standing lumbar extension at counter 10x3" bil Seated hamstring stretch 2x30 sec bil Seated ER/modified pigeon pose - painful   PROM to R and L hip ER/IR      PATIENT EDUCATION:  Education details: HEP update 06/29/22 Person educated: Patient Education method: Explanation, Demonstration, Verbal cues, and Handouts Education comprehension: verbalized understanding and returned demonstration  HOME EXERCISE PROGRAM: Access Code: [email or web address]: [email or web address] Date: 07/14/2022 Prepared by: BraClarene Essexxercises - Supine Lower Trunk Rotation  - 1 x daily - 7 x weekly - 1 sets - 10 reps - Sidelying Open Book Thoracic Lumbar Rotation and Extension  - 1 x daily - 7 x weekly - 1 sets - 10 reps - Supine Hip Internal and External Rotation  - 1 x daily - 7 x weekly - 1 sets - 10 reps - Prone Knee Flexion  - 1 x daily - 7 x weekly - 1 sets - 10 reps - Supine Bridge  - 1 x daily - 7 x weekly - 2 sets - 10 reps - Supine March with Posterior Pelvic Tilt  - 1 x daily - 7 x weekly - 2 sets - 10 reps - Heel Raises with Counter Support  - 1 x daily - 7 x weekly - 2 sets - 10 reps -  Standing Hip Abduction with Counter Support  - 1 x daily - 7 x weekly - 2 sets - 10 reps - Standing Hip Extension with Counter Support  - 1 x daily - 7 x weekly - 2 sets - 10 reps - Standing Hip Flexion with Counter Support  - 1 x daily - 7 x weekly - 2 sets - 10 reps - Forward T with Counter Support  - 1 x daily - 7 x weekly - 2 sets - 10 reps - Standing Lumbar Extension with Counter  - 1 x daily - 7 x weekly - 2 sets - 10 reps - Standing Thoracic Open Book at [employer] 1 x daily - 7 x weekly - 2 sets - 10 reps - Seated Hamstring Stretch  - 1 x daily - 7 x weekly - 1 sets - 3 reps - 30 sec  hold - Seated Marching with Weight Shift  - 1 x daily - 7 x weekly - 3 sets - 10 reps - Standing Anti-Rotation  Press with Anchored Resistance  - 1 x daily - 7 x weekly - 3 sets - 10 reps  ASSESSMENT:  CLINICAL IMPRESSION: Pt shows improved LE strength but I did notice more strength in R quads and glute med. He had an instance of muscle cramping in HS with hip extension MMT. Performed MT to reduce tension and TrP in hamstrings. Afterward he was able to do MMT w/ no cramping and continued with TherEx demonstrating to no limitations.  OBJECTIVE IMPAIRMENTS: decreased activity tolerance, decreased endurance, decreased ROM, decreased strength, hypomobility, increased fascial restrictions, impaired perceived functional ability, increased muscle spasms, impaired sensation, postural dysfunction, and pain.   ACTIVITY LIMITATIONS: carrying, lifting, bending, standing, sleeping, bed mobility, and locomotion level  PARTICIPATION LIMITATIONS: meal prep, cleaning, laundry, community activity, and occupation  PERSONAL FACTORS: Time since onset of injury/illness/exacerbation and 3+ comorbidities: HTN, asthma, dyspnea on exertion, precordial chest pain, hard of hearing, lumbar spondylosis  are also affecting patient's functional outcome.   REHAB POTENTIAL: Good  CLINICAL DECISION MAKING: Stable/uncomplicated  EVALUATION  COMPLEXITY: Low   GOALS: Goals reviewed with patient? Yes  SHORT TERM GOALS: Target date: 06/28/2022   Patient will be independent with initial HEP.  Baseline: given Goal status: MET- 07/01/22 - good understanding, poor compliance d/t schedule   LONG TERM GOALS: Target date: 07/26/2022    Patient will be independent with advanced/ongoing HEP to improve outcomes and carryover.  Baseline: not consistent Goal status: IN PROGRESS  2.  Patient will report 75% improvement in low back pain to improve QOL.  Baseline:  Goal status: IN PROGRESS - 30-40% improvement  3.  Patient will demonstrate full pain free lumbar ROM to perform ADLs.   Baseline: pain in R side with L side bend Goal status: IN PROGRESS  4.  Patient will demonstrate improved functional strength as demonstrated by 5/5 bil hip strength. Baseline: see objective Goal status: IN PROGRESS - 07/14/22  5.  Patient will report 26% or less on modified Oswestry to demonstrate improved functional ability.  Baseline: 38% impairment Goal status: IN PROGRESS   6.  Patient will be able to perform all desired activities without increased low back pain.  Baseline: any "heavy activity" or lifting increases pain Goal status: IN PROGRESS  PLAN:  PT FREQUENCY: 1-2x/week  PT DURATION: 6 weeks  PLANNED INTERVENTIONS: Therapeutic exercises, Therapeutic activity, Neuromuscular re-education, Balance training, Gait training, Patient/Family education, Self Care, Joint mobilization, Dry Needling, Electrical stimulation, Spinal mobilization, Cryotherapy, Moist heat, Ultrasound, Manual therapy, and Re-evaluation.  PLAN FOR NEXT SESSION: progress core strengthening, hip mobility as tolerated, manual therapy and modalities PRN.  Not interested in dry needling.  May give OTAGO exercises for routine for balance as well.    Artist Pais, PTA 07/14/2022, 5:32 PM

## 2022-07-19 ENCOUNTER — Ambulatory Visit: Payer: Medicare Other

## 2022-07-21 DIAGNOSIS — M7582 Other shoulder lesions, left shoulder: Secondary | ICD-10-CM | POA: Diagnosis not present

## 2022-07-21 DIAGNOSIS — M25512 Pain in left shoulder: Secondary | ICD-10-CM | POA: Diagnosis not present

## 2022-07-21 DIAGNOSIS — R52 Pain, unspecified: Secondary | ICD-10-CM | POA: Diagnosis not present

## 2022-07-21 DIAGNOSIS — M7712 Lateral epicondylitis, left elbow: Secondary | ICD-10-CM | POA: Diagnosis not present

## 2022-07-26 ENCOUNTER — Ambulatory Visit: Payer: Medicare Other | Admitting: Physical Therapy

## 2022-07-26 ENCOUNTER — Encounter: Payer: Self-pay | Admitting: Physical Therapy

## 2022-07-26 DIAGNOSIS — M6281 Muscle weakness (generalized): Secondary | ICD-10-CM

## 2022-07-26 DIAGNOSIS — M5441 Lumbago with sciatica, right side: Secondary | ICD-10-CM | POA: Diagnosis not present

## 2022-07-26 DIAGNOSIS — M5459 Other low back pain: Secondary | ICD-10-CM | POA: Diagnosis not present

## 2022-07-26 DIAGNOSIS — R252 Cramp and spasm: Secondary | ICD-10-CM | POA: Diagnosis not present

## 2022-07-26 DIAGNOSIS — G8929 Other chronic pain: Secondary | ICD-10-CM | POA: Diagnosis not present

## 2022-07-26 NOTE — Therapy (Addendum)
OUTPATIENT PHYSICAL THERAPY TREATMENT   Patient Name: Rayen Dafoe MRN: [id number] DOB:December 07, 1950, 72 y.o., male Today's Date: 07/26/2022  END OF SESSION:  PT End of Session - 07/26/22 1403     Visit Number 8    Number of Visits 12    Date for PT Re-Evaluation 07/26/22    Authorization Type Medicare    Progress Note Due on Visit 10    PT Start Time 1400    PT Stop Time 8338    PT Time Calculation (min) 45 min    Activity Tolerance Patient tolerated treatment well    Behavior During Therapy WFL for tasks assessed/performed                Past Medical History:  Diagnosis Date   Asthma    BPH (benign prostatic hyperplasia)    GERD (gastroesophageal reflux disease)    HLD (hyperlipidemia)    HSV infection    Hypertension    Kidney stones    Past Surgical History:  Procedure Laterality Date   APPENDECTOMY     BREAST CYST EXCISION Left    benign   LITHOTRIPSY     TONSILLECTOMY     Patient Active Problem List   Diagnosis Date Noted   Rhinitis 06/09/2020   Healthcare maintenance 06/09/2020   Vitreous floater, bilateral 05/15/2019   Nuclear sclerotic cataract of both eyes 05/15/2019   Precordial chest pain 04/06/2018   Dyspnea on exertion 04/06/2018   Dyslipidemia 04/06/2018   BPH with urinary obstruction 03/31/2018   Essential hypertension 01/17/2018   Solitary lung nodule 09/07/2017   Sinusitis, chronic/Cough 07/22/2016   Chronic asthma, mild persistent, uncomplicated 25/10/3974   Upper airway cough syndrome 06/29/2016    PCP: Copland, Gay Filler, MD    REFERRING PROVIDER: Darreld Mclean, MD   REFERRING DIAG: M54.50,G89.29 (ICD-10-CM) - Chronic right-sided low back pain without sciatica  Rationale for Evaluation and Treatment: Rehabilitation  THERAPY DIAG:  Other low back pain  Cramp and spasm  Muscle weakness (generalized)  ONSET DATE: chronic  SUBJECTIVE:                                                                                                                                                                                            SUBJECTIVE STATEMENT: Went to orthopedist who told him he had tennis elbow and gave him brace.  He says back is better but pain comes and goes.  May be taking a break from PT, worried about getting a big bill at the end.  Mostly just has pain at night now, or if makes a sudden move.    PERTINENT  HISTORY:  HTN, asthma, dyspnea on exertion, precordial chest pain, hard of hearing, lumbar spondylosis  PAIN:  Are you having pain? Yes: NPRS scale: 0/10 Pain location: R side low back/hip  Pain description: sharp Aggravating factors: lifting and activity  Relieving factors: heat  PRECAUTIONS: None  WEIGHT BEARING RESTRICTIONS: No  FALLS:  Has patient fallen in last 6 months? 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   NEXT MD VISIT: none scheduled  OBJECTIVE:   DIAGNOSTIC FINDINGS:  DG Lumbar Spine Complete Result Date: 12/25/2021 FINDINGS: No recent fracture is seen. Alignment of posterior margins of vertebral bodies is within normal limits. Degenerative changes are noted with anterior bony spurs and facet hypertrophy, more so at L4-L5 and L5-S1 levels. There is mild narrowing of disc space at L4-L5 level. There is interval progression of degenerative changes. IMPRESSION: No recent fracture is seen in the lumbar spine. Lumbar spondylosis, more so at L4-L5 and L5-S1 levels with interval progression.  PATIENT SURVEYS:  Modified Oswestry 19/50 = 38% moderate disability   SCREENING FOR RED FLAGS: Bowel or bladder incontinence:  No Spinal tumors: No Cauda equina syndrome: No Compression fracture: No Abdominal aneurysm: No  COGNITION: Overall cognitive status: Within functional limits for tasks assessed     SENSATION: WFL  MUSCLE LENGTH: Hamstrings: Right ~75 deg; Left ~75 deg   POSTURE: rounded shoulders and forward head  PALPATION: Tenderness primarily R QL, R lumbar paraspinals, some tenderness with PA mobs to L3-5  LUMBAR ROM:   AROM eval  Flexion WNL  Extension WNL  Right lateral flexion WNL  Left lateral flexion Mid thigh, Inc pain  Right rotation WNL  Left rotation WNL   (Blank rows = not tested)  LOWER EXTREMITY ROM:     Active  Right eval Left eval  Hip internal rotation 12 20  Hip external rotation 40 40   (Blank rows = not tested)  LOWER EXTREMITY MMT:    MMT Right eval Left eval Right 07/14/22 Left 07/14/22  Hip flexion 5 4+ 5 5  Hip extension 4* 4 4+ - initial cramping in HS but better after STM 5  Hip abduction 4+ 4+ 4+ 4+  Hip adduction 5 4+ 5 5  Knee flexion 5 5    Knee extension 5 5    Ankle dorsiflexion      Ankle plantarflexion       (Blank rows = not tested)  LUMBAR SPECIAL TESTS:  Straight leg raise test: Negative, Ely's Test postive bil for femoral nerve tension.   FUNCTIONAL TESTS:  5 times sit to stand: 14 seconds, no UE assist  GAIT: Distance walked: 73' Assistive device utilized: None Level of assistance: Complete Independence Comments: no significant deviation or device.   TODAY'S TREATMENT:  DATE:  07/26/22 Therapeutic Exercise: to improve strength and mobility.  Demo, verbal and tactile cues throughout for technique. Nustep L5 x 6 min  Hamstring stretches - sitting with leg extended 2 x 30 sec, then extened on chair x 1 min bil, cues throughout Supine IR/ER stretch x 10 bil  Review of HEP  Manual Therapy: to decrease  muscle spasm and pain and improve mobility IASTM with foam roller to bil glutes and lumbar paraspinals, STM/TPR to bil QL and lumbar paraspinals.    07/14/22 Therapeutic Exercise: to improve strength and mobility.  Demo, verbal and tactile cues throughout for technique. Nustep L5x84mn LE MMT Standing hip hinge with dowel x 10 Functional squat x 10  Standing pallof press blue TB x 10 each way  Standing trunk twist blue TB x 10 each way Standing reverse chop with yellow weighted ball x 10   Manual Therapy: to decrease muscle spasm and pain and improve mobility  STM and IASTM (rolling stick)  to R/L hamstrings  07/12/22 Therapeutic Exercise: to improve strength and mobility.  Demo, verbal and tactile cues throughout for technique. Nustep L5x664m Standing heel/toe 2x10 each way Standing hip extension 2x10 bil Good mornings with cane x 10  Standing shoulder flexion AROM with cane x 10  Knee flexion 10# 2x10 BLE Knee extension 5# 2x10 BLE Fwd step ups 2x10 6' Seated hip ER/IR AROM in siting 07/01/22 Therapeutic Exercise: to improve strength and mobility.  Demo, verbal and tactile cues throughout for technique. Recumbent Bike L2x5m64mStanding heel raise x 20- fatigue in R hip Standing hip abduction 2x10 bil Standing hip extension 2x10 bil Standing hip flexion 2x10 bil Standing lumbar extension at counter 10x3" bil Seated hamstring stretch 2x30 sec bil Seated ER/modified pigeon pose - painful   PROM to R and L hip ER/IR      PATIENT EDUCATION:  Education details: HEP update 06/29/22, 07/14/22, 07/26/22 Person educated: Patient Education method: Explanation, Demonstration, Verbal cues, and Handouts Education comprehension: verbalized understanding and returned demonstration  HOME EXERCISE PROGRAM: Access Code: [email or web address]: [email or web address] Date: 07/26/2022 Prepared by: EliGlenetta Hewxercises - Supine Lower Trunk Rotation  - 1 x daily - 7 x weekly - 1  sets - 10 reps - Sidelying Open Book Thoracic Lumbar Rotation and Extension  - 1 x daily - 7 x weekly - 1 sets - 10 reps - Supine Hip Internal and External Rotation  - 1 x daily - 7 x weekly - 1 sets - 10 reps - Prone Knee Flexion  - 1 x daily - 7 x weekly - 1 sets - 10 reps - Supine Bridge  - 1 x daily - 7 x weekly - 2 sets - 10 reps - Supine March with Posterior Pelvic Tilt  - 1 x daily - 7 x weekly - 2 sets - 10 reps - Heel Raises with Counter Support  - 1 x daily - 7 x weekly - 2 sets - 10 reps - Standing Hip Abduction with Counter Support  - 1 x daily - 7 x weekly - 2 sets - 10 reps - Standing Hip Extension with Counter Support  - 1 x daily - 7 x weekly - 2 sets - 10 reps - Standing Hip Flexion with Counter Support  - 1 x daily - 7 x weekly - 2 sets - 10 reps - Forward T with Counter Support  - 1 x daily - 7 x weekly - 2 sets - 10 reps - Standing Lumbar Extension  with Counter  - 1 x daily - 7 x weekly - 2 sets - 10 reps - Standing Thoracic Open Book at [place]  - 1 x daily - 7 x weekly - 2 sets - 10 reps - Seated Hamstring Stretch  - 1 x daily - 7 x weekly - 1 sets - 3 reps - 30 sec  hold - Seated Marching with Weight Shift  - 1 x daily - 7 x weekly - 3 sets - 10 reps - Standing Anti-Rotation Press with Anchored Resistance  - 1 x daily - 7 x weekly - 3 sets - 10 reps - Hooklying Hamstring Stretch with Strap  - 1 x daily - 7 x weekly - 1 sets - 3 reps - 1 min  hold - Seated Table Hamstring Stretch  - 1 x daily - 7 x weekly - 1 sets - 3 reps - 1 min  hold - Supine Hamstring Stretch with Doorway  - 1 x daily - 7 x weekly - 1 sets - 3 reps - 1 min  hold - Supine Hip Internal and External Rotation  - 1 x daily - 7 x weekly - 2 sets - 10 reps  ASSESSMENT:  CLINICAL IMPRESSION: Champion Corales reports intermittent low back pain primarily at night now, also difficulty bending forward to wash feet.  Noted significant tightness in hamstrings, he has not been performing hamstring stretches, so focused on  hamstring stretches needing cuing to keep chest up and hinge forward rather than bending upper back, at which point he could feel how tight his hamstring were.  Recommended stretching before bed to see if this improves discomfort at night, and given several different options.  Did not add other exercises as he is reporting feeling overwhelmed with exercise, as he was given exercises for his arm by his doctor.  He reports about 50% improvement with PT in low back pain, and mod Oswestry has improved to 30% impairment.  He would like to be placed on hold today due to cost of therapy.   OBJECTIVE IMPAIRMENTS: decreased activity tolerance, decreased endurance, decreased ROM, decreased strength, hypomobility, increased fascial restrictions, impaired perceived functional ability, increased muscle spasms, impaired sensation, postural dysfunction, and pain.   ACTIVITY LIMITATIONS: carrying, lifting, bending, standing, sleeping, bed mobility, and locomotion level  PARTICIPATION LIMITATIONS: meal prep, cleaning, laundry, community activity, and occupation  PERSONAL FACTORS: Time since onset of injury/illness/exacerbation and 3+ comorbidities: HTN, asthma, dyspnea on exertion, precordial chest pain, hard of hearing, lumbar spondylosis  are also affecting patient's functional outcome.   REHAB POTENTIAL: Good  CLINICAL DECISION MAKING: Stable/uncomplicated  EVALUATION COMPLEXITY: Low   GOALS: Goals reviewed with patient? Yes  SHORT TERM GOALS: Target date: 06/28/2022   Patient will be independent with initial HEP.  Baseline: given Goal status: MET- 07/01/22 - good understanding, poor compliance d/t schedule   LONG TERM GOALS: Target date: 07/26/2022    Patient will be independent with advanced/ongoing HEP to improve outcomes and carryover.  Baseline: not consistent Goal status: IN PROGRESS 07/26/22 - met for current.   2.  Patient will report 75% improvement in low back pain to improve QOL.  Baseline:   Goal status: IN PROGRESS - 30-40% improvement  3.  Patient will demonstrate full pain free lumbar ROM to perform ADLs.   Baseline: pain in R side with L side bend Goal status: IN PROGRESS 07/26/22- still pain with L side bend.   4.  Patient will demonstrate improved functional strength as demonstrated by  5/5 bil hip strength. Baseline: see objective Goal status: IN PROGRESS - 07/14/22 improving  5.  Patient will report 26% or less on modified Oswestry to demonstrate improved functional ability.  Baseline: 38% impairment Goal status: IN PROGRESS 07/26/22- 15/50 = 30%   6.  Patient will be able to perform all desired activities without increased low back pain.  Baseline: any "heavy activity" or lifting increases pain Goal status: IN PROGRESS   PLAN:  PT FREQUENCY: 1-2x/week  PT DURATION: 6 weeks  PLANNED INTERVENTIONS: Therapeutic exercises, Therapeutic activity, Neuromuscular re-education, Balance training, Gait training, Patient/Family education, Self Care, Joint mobilization, Dry Needling, Electrical stimulation, Spinal mobilization, Cryotherapy, Moist heat, Ultrasound, Manual therapy, and Re-evaluation.  PLAN FOR NEXT SESSION:  30 day hold.    Rennie Natter, PT, DPT  07/26/2022, 6:36 PM   PHYSICAL THERAPY DISCHARGE SUMMARY  Visits from Start of Care: 8  Current functional level related to goals / functional outcomes: 50% improvement in LBP   Remaining deficits: Tightness bil HS and hips, intermittent LBP   Education / Equipment: HEP  Plan: Patient agrees to discharge.  Patient is being discharged due to meeting the stated rehab goals.  Edel Rivero was placed on 30 day hold on 07/26/22 by request and has not returned during stated interval.    Rennie Natter, PT, DPT 9:06 AM 09/15/2022

## 2022-08-11 ENCOUNTER — Other Ambulatory Visit (HOSPITAL_COMMUNITY): Payer: Self-pay

## 2022-08-12 ENCOUNTER — Other Ambulatory Visit: Payer: Self-pay

## 2022-08-12 MED ORDER — PANTOPRAZOLE SODIUM 20 MG PO TBEC: 20.0000 mg | DELAYED_RELEASE_TABLET | Freq: Every day | ORAL | 0 refills | Status: DC

## 2022-08-23 ENCOUNTER — Other Ambulatory Visit: Payer: Self-pay

## 2022-09-01 DIAGNOSIS — M7582 Other shoulder lesions, left shoulder: Secondary | ICD-10-CM | POA: Diagnosis not present

## 2022-09-01 DIAGNOSIS — M7712 Lateral epicondylitis, left elbow: Secondary | ICD-10-CM | POA: Diagnosis not present

## 2022-09-05 ENCOUNTER — Other Ambulatory Visit: Payer: Self-pay | Admitting: Family Medicine

## 2022-09-05 DIAGNOSIS — A609 Anogenital herpesviral infection, unspecified: Secondary | ICD-10-CM

## 2022-09-13 ENCOUNTER — Telehealth: Payer: Self-pay | Admitting: Internal Medicine

## 2022-09-13 NOTE — Telephone Encounter (Signed)
Attempted to call pt but unable to reach. Left message to return call.  

## 2022-09-13 NOTE — Telephone Encounter (Signed)
PT ret our call. Pls try again.  

## 2022-09-13 NOTE — Telephone Encounter (Signed)
PT calling because meds not covered by insurance. Needs to know what his options would be. Pls call @ [phone]   [facility] Rein is [facility] in [place].

## 2022-09-14 NOTE — Telephone Encounter (Signed)
Patient states that Symbicort is no longer covered by insurance.   Can we run a ticket to see what is covered  Thank you

## 2022-09-15 NOTE — Telephone Encounter (Signed)
PT calling again for status of RX. Pls call @ [phone]  Fears side affects after Symbicort usage for 20 years.

## 2022-09-15 NOTE — Telephone Encounter (Signed)
ATC pt LVM w/ update on status.

## 2022-09-16 ENCOUNTER — Other Ambulatory Visit (HOSPITAL_COMMUNITY): Payer: Self-pay

## 2022-09-16 NOTE — Telephone Encounter (Signed)
Preferred alternative under patients insurance is now the generic Advair Diskus/Wixela for $10.00/90 days or $5.00/30 days.

## 2022-09-16 NOTE — Telephone Encounter (Signed)
Attempted to call patient no answer left voicemail for him to call office back to go over inhaler.

## 2022-09-16 NOTE — Telephone Encounter (Signed)
Dr Melvyn Novas:  Preferred alternative under patients insurance is now the generic Advair Diskus/Wixela for $10.00/90 days or $5.00/30 days.

## 2022-09-16 NOTE — Telephone Encounter (Signed)
Ok to try wixella 100 one twice daily but pharmacist will need to show him how it works or return to see NP with sample

## 2022-09-23 DIAGNOSIS — H11153 Pinguecula, bilateral: Secondary | ICD-10-CM | POA: Diagnosis not present

## 2022-09-23 DIAGNOSIS — H5203 Hypermetropia, bilateral: Secondary | ICD-10-CM | POA: Diagnosis not present

## 2022-09-23 DIAGNOSIS — H524 Presbyopia: Secondary | ICD-10-CM | POA: Diagnosis not present

## 2022-09-23 DIAGNOSIS — H43393 Other vitreous opacities, bilateral: Secondary | ICD-10-CM | POA: Diagnosis not present

## 2022-09-23 DIAGNOSIS — H2513 Age-related nuclear cataract, bilateral: Secondary | ICD-10-CM | POA: Diagnosis not present

## 2022-09-23 DIAGNOSIS — H52203 Unspecified astigmatism, bilateral: Secondary | ICD-10-CM | POA: Diagnosis not present

## 2022-09-23 NOTE — Telephone Encounter (Signed)
Called and spoke with patient. I advised him of the covered medications. He would like to stay with Symbicort if possible. He stated that he called his insurance and they advised him that if we complete a tier exception and it is approved, they will cover the Symbicort. I advised him that I would attempt tier exception. He verbalized understanding.   Form was printed from online. Will complete and fax back to [place]. Will keep this encounter open for follow up.

## 2022-10-11 ENCOUNTER — Encounter: Payer: Self-pay | Admitting: Internal Medicine

## 2022-10-12 NOTE — Telephone Encounter (Signed)
Joshua [name], please advise on pt's message.   Phone note from 09/13/22:  Called and spoke with patient. I advised him of the covered medications. He would like to stay with Symbicort if possible. He stated that he called his insurance and they advised him that if we complete a tier exception and it is approved, they will cover the Symbicort. I advised him that I would attempt tier exception. He verbalized understanding.    Form was printed from online. Will complete and fax back to [place]. Will keep this encounter open for follow up.

## 2022-10-14 ENCOUNTER — Other Ambulatory Visit: Payer: Self-pay

## 2022-10-14 MED ORDER — FLUTICASONE-SALMETEROL 100-50 MCG/ACT IN AEPB: 1.0000 | INHALATION_SPRAY | Freq: Two times a day (BID) | RESPIRATORY_TRACT | 3 refills | Status: DC

## 2022-10-24 NOTE — Progress Notes (Deleted)
Subjective:    Patient ID: Joshua [name], male   DOB: 04-Nov-1950    MRN: [id number]    Brief patient profile:  72 yo [place] male  with bad bronchitis around 1993 and quit smoking and then  all better until around 2000 while living in [place] with intermittent "wheezing" dx as asthma > never resolved  Self referred for dtc asthma  Allergy testing in [place] around age 89 = neg per pt    History of Present Illness  06/29/2016 1st  Pulmonary office visit/ Joshua [name]   Chief Complaint  Patient presents with   Pulmonary Consult    moved from [place], chronic bronchitis, wheezing, dx with asthma in 2000, cold humidity makes it worse  maint on symb 160 2bid and at baseline intermittently noisy breathing one breath q 2-3 days  s pattern though not waking him  noct   Worse since  Jun 22 2016  with uri/ sinus symptoms >  [facility] rx  Amox/only a little better, still severe nasal congestion/severe fits of day > noct cough and subj wheeze.  Typical triggers are uri/not seasonal pattern rhintiis - happens up to 4 x yearly with or without symbicort and just as severe. rec Augmentin 875 mg take one pill twice daily  X 10 days   Please see patient coordinator before you leave today  to schedule CT sinus  in 10 days - no sooner Use nasal saline as much as possible to help moisturize your passages  Continue protonix but take it Take 30-60 min before first meal of the day  Prednisone 10 mg take  4 each am x 2 days,   2 each am x 2 days,  1 each am x 2 days and stop  Work on inhaler technique:  Plan A = Automatic = symbicort 80 Take 2 puffs first thing in am and then another 2 puffs about 12 hours later.  Plan B = Backup Only use your albuterol (Proir) as a rescue medication to be used if you can't catch your breath by resting or doing a relaxed purse lip breathing pattern.  - The less you use it, the better it will work when you need it. - Ok to use the inhaler up to 2 puffs  every 4 hours     07/21/2016  f/u  ov/Joshua [name] re: uacs vs asthma Chief Complaint  Patient presents with   Follow-up    Cough is much improved, but has not completely resolved. Breathing has improved.   cough is worse at bedtime and in am p rising but not waking up prematurely and not productive  Bloody nasal d/c / no sob at all  rec Use lots of saline nasal spray on your nose Please remember to go to the x-ray department downstairs for your tests - we will call you with the results when they are available. Ty off the symbicort to see if any worse and if so restart  At 2 pffs every 12 hour schedule ENT  >   [name] rec abx and f/u prn     11/17/2021  f/u ov/Joshua [name] re: uacs vs cough variant asthma maint on symbicort 80 2bid   Chief Complaint  Patient presents with   Follow-up    No complaints.   Dyspnea:  MMRC1 = can walk nl pace, flat grade, can't hurry or go uphills or steps s sob - pushes mower x 30 min    Cough: min in am  Sleeping: no resp cc  SABA use: 1-2 x weekly  Rec Plan A = Automatic = Always=    symbicort 80 Take 2 puffs first thing in am and then another 2 puffs about 12 hours later.  Work on inhaler technique:   Plan B = Backup (to supplement plan A, not to replace it) Only use your albuterol inhaler as a rescue medication   06/08/2022  f/u ov/Joshua [name] re: uacs vs cough variant asthma  maint on symbicort 80   Chief Complaint  Patient presents with   Follow-up    Since LOV Pt states he has been sick but went to PCP and received steroids and antibiotics. Pt states that he feels better now.   Dyspnea:  MMRC1 = can walk nl pace, flat grade, can't hurry or go uphills or steps s sob   Cough: more daytime cough  Sleeping: bed flat/ 2 pillows  SABA use: once a week uses hs  02: none  Covid status:   vax max  Rec At onset of respiratory flare > pantoprazole should be :   Take  40 mg x 30-60 min before first meal of the day and Pepcid ac (famotidine) 20 mg one after supper  until cough is completely gone for at  least a week without the need for cough suppression Delsym for dry mucinex dm for the wet congested cough    10/25/2022  f/u ov/Joshua [name] re: cough variant asthma   maint on ***  No chief complaint on file.   Dyspnea:  *** Cough: *** Sleeping: *** SABA use: *** 02: *** Covid status:   *** Lung cancer screening :  ***    No obvious day to day or daytime variability or assoc excess/ purulent sputum or mucus plugs or hemoptysis or cp or chest tightness, subjective wheeze or overt sinus or hb symptoms.   *** without nocturnal  or early am exacerbation  of respiratory  c/o's or need for noct saba. Also denies any obvious fluctuation of symptoms with weather or environmental changes or other aggravating or alleviating factors except as outlined above   No unusual exposure hx or h/o childhood pna/ asthma or knowledge of premature birth.  Current Allergies, Complete Past Medical History, Past Surgical History, Family History, and Social History were reviewed in Joshua [name] record.  ROS  The following are not active complaints unless bolded Hoarseness, sore throat, dysphagia, dental problems, itching, sneezing,  nasal congestion or discharge of excess mucus or purulent secretions, ear ache,   fever, chills, sweats, unintended wt loss or wt gain, classically pleuritic or exertional cp,  orthopnea pnd or arm/hand swelling  or leg swelling, presyncope, palpitations, abdominal pain, anorexia, nausea, vomiting, diarrhea  or change in bowel habits or change in bladder habits, change in stools or change in urine, dysuria, hematuria,  rash, arthralgias, visual complaints, headache, numbness, weakness or ataxia or problems with walking or coordination,  change in mood or  memory.        No outpatient medications have been marked as taking for the 10/25/22 encounter (Appointment) with Joshua Cowden, MD.                           Objective:   Physical  Exam  Wts  10/25/2022        ***  06/08/2022     196   11/17/2021       198  04/16/2021     195  07/28/2020  203 07/30/2019         203  01/23/2019       201  09/01/2018         205  07/18/2018       200     02/28/2018          203   10/03/2017         204   09/05/2017       204 07/21/16 196 lb (88.9 kg)  06/29/16 197 lb 12.8 oz (89.7 kg)     Vital signs reviewed  10/25/2022  - Note at rest 02 sats  ***% on ***   General appearance:    ***     top denture   ***             Assessment:

## 2022-10-25 ENCOUNTER — Ambulatory Visit: Payer: Medicare Other | Admitting: Internal Medicine

## 2022-10-28 ENCOUNTER — Encounter: Payer: Self-pay | Admitting: Gastroenterology

## 2022-11-08 NOTE — Progress Notes (Unsigned)
Subjective:    Patient ID: Joshua [name], male   DOB: 1950/10/12    MRN: [id number]    Brief patient profile:  72 yo [place] male  with bad bronchitis onset  around 1993 and quit smoking and then  all better until around 2000 while living in [place] with intermittent "wheezing" dx as asthma > never resolved  Self referred for dtc asthma  Allergy testing in [place] around age 63 = neg per pt    History of Present Illness  06/29/2016 1st [facility] office visit/ Cintya Daughety   Chief Complaint  Patient presents with   Pulmonary Consult    moved from [place], chronic bronchitis, wheezing, dx with asthma in 2000, cold humidity makes it worse  maint on symb 160 2bid and at baseline intermittently noisy breathing one breath q 2-3 days  s pattern though not waking him  noct   Worse since  Jun 22 2016  with uri/ sinus symptoms >  [facility] rx  Amox/only a little better, still severe nasal congestion/severe fits of day > noct cough and subj wheeze.  Typical triggers are uri/not seasonal pattern rhintiis - happens up to 4 x yearly with or without symbicort and just as severe. rec Augmentin 875 mg take one pill twice daily  X 10 days   Please see patient coordinator before you leave today  to schedule CT sinus  in 10 days - no sooner Use nasal saline as much as possible to help moisturize your passages  Continue protonix but take it Take 30-60 min before first meal of the day  Prednisone 10 mg take  4 each am x 2 days,   2 each am x 2 days,  1 each am x 2 days and stop  Work on inhaler technique:  Plan A = Automatic = symbicort 80 Take 2 puffs first thing in am and then another 2 puffs about 12 hours later.  Plan B = Backup Only use your albuterol (Proir) as a rescue medication to be used if you can't catch your breath by resting or doing a relaxed purse lip breathing pattern.  - The less you use it, the better it will work when you need it. - Ok to use the inhaler up to 2 puffs  every 4 hours      07/21/2016  f/u ov/Brendalyn Vallely re: uacs vs asthma Chief Complaint  Patient presents with   Follow-up    Cough is much improved, but has not completely resolved. Breathing has improved.   rec Use lots of saline nasal spray on your nose Please remember to go to the x-ray department downstairs for your tests - we will call you with the results when they are available. Ty off the symbicort to see if any worse and if so restart  At 2 pffs every 12 hour schedule ENT  >   [name] rec abx and f/u prn     11/17/2021  f/u ov/Adalee Kathan re: uacs vs cough variant asthma maint on symbicort 80 2bid   Chief Complaint  Patient presents with   Follow-up    No complaints.  Rec Plan A = Automatic = Always=    symbicort 80 Take 2 puffs first thing in am and then another 2 puffs about 12 hours later.  Work on inhaler technique:   Plan B = Backup (to supplement plan A, not to replace it) Only use your albuterol inhaler as a rescue medication   06/08/2022  f/u ov/Kennethia Lynes re: uacs  vs cough variant asthma  maint on symbicort 80   Chief Complaint  Patient presents with   Follow-up    Since LOV Pt states he has been sick but went to PCP and received steroids and antibiotics. Pt states that he feels better now.   Dyspnea:  MMRC1 = can walk nl pace, flat grade, can't hurry or go uphills or steps s sob   Cough: more daytime cough  Sleeping: bed flat/ 2 pillows  SABA use: once a week uses hs  02: none  Covid status:   vax max  Rec At onset of respiratory flare > pantoprazole should be :   Take  40 mg x 30-60 min before first meal of the day and Pepcid ac (famotidine) 20 mg one after supper  until cough is completely gone for at least a week without the need for cough suppression Delsym for dry mucinex dm for the wet congested cough    11/09/2022  f/u ov/Cheyne Bungert re: cough variant asthma maint on symbicort 80 / worse control with pollen / insurance wants him to try wixella  Chief Complaint  Patient presents with    Follow-up    Discuss medications.  Some wheeze with increased pollen outdoors.  Increased use of inhaler, especially at night.  Insurance changed inhaler from Symbicort to [employer].  Patient states Advair Discus in past caused hoarsness.    Dyspnea:  MMRC1 = can walk nl pace, flat grade, can't hurry or go uphills or steps s sob  Vanetta Shawl can be limited by doe at times depending on weather / pollen  Cough: min mucoid/ remembers worse on advair and high dose symbicort  Sleeping: poorly due to cough/ wheeze/need for noct saba  SABA use: increased x sev weeks  02: none     No obvious day to day or daytime variability or assoc excess/ purulent sputum or mucus plugs or hemoptysis or cp or chest tightness,   or overt sinus or hb symptoms.   Also denies any obvious fluctuation of symptoms with weather or environmental changes or other aggravating or alleviating factors except as outlined above   No unusual exposure hx or h/o childhood pna/ asthma or knowledge of premature birth.  Current Allergies, Complete Past Medical History, Past Surgical History, Family History, and Social History were reviewed in [employer] record.  ROS  The following are not active complaints unless bolded Hoarseness, sore throat, dysphagia, dental problems, itching, sneezing,  nasal congestion or discharge of excess mucus or purulent secretions, ear ache,   fever, chills, sweats, unintended wt loss or wt gain, classically pleuritic or exertional cp,  orthopnea pnd or arm/hand swelling  or leg swelling, presyncope, palpitations, abdominal pain, anorexia, nausea, vomiting, diarrhea  or change in bowel habits or change in bladder habits, change in stools or change in urine, dysuria, hematuria,  rash, arthralgias, visual complaints, headache, numbness, weakness or ataxia or problems with walking or coordination,  change in mood or  memory.        Current Meds  Medication Sig   acyclovir ointment (ZOVIRAX) 5  % Apply 1 application topically every 3 (three) hours. Use for one week as needed for outbreak   albuterol (VENTOLIN HFA) 108 (90 Base) MCG/ACT inhaler INHALE 1 TO 2 PUFFS INTO THE LUNGS EVERY 6 HOURS AS NEEDED FOR WHEEZING OR SHORTNESS OF BREATH   alfuzosin (UROXATRAL) 10 MG 24 hr tablet Take 1 tablet (10 mg total) by mouth daily.   atorvastatin (LIPITOR) 10 MG  tablet TAKE 1 TABLET BY MOUTH DAILY   budesonide-formoterol (SYMBICORT) 80-4.5 MCG/ACT inhaler INHALE 2 PUFFS BY MOUTH FIRST THING IN THE MORNING THEN INHALE 2 PUFFS BY MOUTH 12 HOURS LATER   diclofenac Sodium (VOLTAREN) 1 % GEL Apply 2 g topically 4 (four) times daily. Use as needed for pain   losartan (COZAAR) 100 MG tablet TAKE 1 TABLET(100 MG) BY MOUTH DAILY   pantoprazole (PROTONIX) 20 MG tablet Take 1 tablet (20 mg total) by mouth daily. Can increase to twice daily if needed   tadalafil (CIALIS) 5 MG tablet Take 1 tablet (5 mg total) by mouth daily. Use daily for BPH   triamcinolone cream (KENALOG) 0.1 % Apply 1 Application topically 2 (two) times daily. Use as needed for dermatitis on leg   valACYclovir (VALTREX) 500 MG tablet TAKE 1 TABLET (500 MG TOTAL) BY MOUTH DAILY.                           Objective:   Physical Exam  Wts  11/09/2022       199  06/08/2022     196   11/17/2021       198  04/16/2021     195  07/28/2020       203 07/30/2019         203  01/23/2019       201  09/01/2018         205  07/18/2018       200     02/28/2018          203   10/03/2017         204   09/05/2017       204 07/21/16 196 lb (88.9 kg)  06/29/16 197 lb 12.8 oz (89.7 kg)     Vital signs reviewed  11/09/2022  - Note at rest 02 sats  98% on RA   General appearance:    amb wm nad    HEENT : Oropharynx  clear/ top denture     Nasal turbinates mild edema/ no polyps    NECK :  without  apparent JVD/ palpable Nodes/TM    LUNGS: no acc muscle use,  Nl contour chest which is clear to A and P bilaterally without cough on insp or exp  maneuvers   CV:  RRR  no s3 or murmur or increase in P2, and no edema   ABD:  soft and nontender with nl inspiratory excursion in the supine position. No bruits or organomegaly appreciated   MS:  Nl gait/ ext warm without deformities Or obvious joint restrictions  calf tenderness, cyanosis or clubbing    SKIN: warm and dry without lesions    NEURO:  alert, approp, nl sensorium with  no motor or cerebellar deficits apparent.           Assessment:

## 2022-11-09 ENCOUNTER — Ambulatory Visit (INDEPENDENT_AMBULATORY_CARE_PROVIDER_SITE_OTHER): Payer: Medicare Other | Admitting: Internal Medicine

## 2022-11-09 ENCOUNTER — Ambulatory Visit: Payer: Medicare Other | Admitting: Internal Medicine

## 2022-11-09 ENCOUNTER — Encounter: Payer: Self-pay | Admitting: Internal Medicine

## 2022-11-09 VITALS — BP 138/80 | HR 80 | Temp 98.0°F | Ht 69.0 in | Wt 199.0 lb

## 2022-11-09 DIAGNOSIS — R058 Other specified cough: Secondary | ICD-10-CM

## 2022-11-09 DIAGNOSIS — J4530 Mild persistent asthma, uncomplicated: Secondary | ICD-10-CM | POA: Diagnosis not present

## 2022-11-09 MED ORDER — ALBUTEROL SULFATE HFA 108 (90 BASE) MCG/ACT IN AERS
INHALATION_SPRAY | RESPIRATORY_TRACT | 2 refills | Status: DC
Start: 2022-11-09 — End: 2023-01-24

## 2022-11-09 MED ORDER — PREDNISONE 10 MG PO TABS: ORAL_TABLET | ORAL | 0 refills | Status: DC

## 2022-11-09 MED ORDER — PANTOPRAZOLE SODIUM 20 MG PO TBEC
DELAYED_RELEASE_TABLET | ORAL | 2 refills | Status: DC
Start: 2022-11-09 — End: 2024-01-30

## 2022-11-09 MED ORDER — FLUTICASONE-SALMETEROL 100-50 MCG/ACT IN AEPB: 1.0000 | INHALATION_SPRAY | Freq: Two times a day (BID) | RESPIRATORY_TRACT | Status: DC

## 2022-11-09 NOTE — Patient Instructions (Addendum)
Prednisone 10 mg take  4 each am x 2 days,   2 each am x 2 days,  1 each am x 2 days and stop   Wixella 100 one click  twice daily and if not satisfactory call for a Prior Approval for symbicort 80 - try arm and hammer toothpaste after each   For flares of cough /wheezing > Pantoprazole (protonix) 40 mg   Take  30-60 min before first meal of the day and Pepcid (famotidine)  20 mg after supper until cough is gone   FENO level will tell us whether the dose of symbiocort or wixella is adequate   Please schedule a follow up office visit in 6 weeks, call sooner if needed

## 2022-11-10 ENCOUNTER — Telehealth: Payer: Self-pay

## 2022-11-10 NOTE — Assessment & Plan Note (Signed)
Onset around 2000 in [place] with allergy testing neg there 06/29/2016    reduce symbicort to 80 2 bid  - FENO 07/21/2016  =   24 on symb 80 2bid - Spirometry 07/21/2016  wnl including  Mid flows p am symbicort > try off > flared so restarted  - 09/05/2017    try symb increase to 160  - PFT's  10/03/2017  FEV1 2.57 (88 % ) ratio 67  p 21 % improvement from saba p symb 160 prior to study with DLCO  108 % corrects to 113 % for alv volume  With variable truncation of insp portion of f/v loop and reported better benefit from 80 than 160 strength c/w uacs / vcd component  - 05/31/2018 flare on symb 80 2bid  - 07/18/2018 added singulair trial > d/c 01/23/2019  - 07/18/18 Spirometry   FEV1 2.6 (88%)  Ratio 0.80  - 09/01/2018  After extensive coaching inhaler device,  effectiveness =    75% (short Ti) rec try symb 80 just one bid to see if helps hoarseness or flares asthma > consider adding spacer next  - try off singulair 01/23/2019  - 11/09/2022  After extensive coaching inhaler device,  effectiveness =    80% (short ti) / 90% with wixella device > ok to try wixella but keep dose 100 one bid due to tendency to cough on higher doses of advair   Having mild flare of cough /wheeze with pollen even on symbicort 80 so try Prednisone 10 mg take  4 each am x 2 days,   2 each am x 2 days,  1 each am x 2 days and stop and wixella but low threshold to do PA review of other options if cough worse

## 2022-11-10 NOTE — Assessment & Plan Note (Signed)
Onset 05/2016  - sinus CT p 10 day augmentin  06/29/2016 >>>  CT  Sinus 07/09/2016 mild chronic changes only  - cough better 07/21/2016 > try just on gerd rx/ no more symbicort > flared off symbicort  eval by [place] ent 07/26/16  Nordbladh, PA > rec levaquin  500 x  10 days and f/u prn  - severe flare 07/18/2018 p uri/? Sinusitis > augmentin /cyclical cough rx> 09/01/2018 improved x for hoarseness and cough with voice use  - severe flare 06/25/20 in setting of covid 19 infection  > cyclical cough rx 06/25/20 > improved 07/28/2020   Reminded in event of cough max gerd rx until resolves       F/u in 6 weeks   Each maintenance medication was reviewed in detail including emphasizing most importantly the difference between maintenance and prns and under what circumstances the prns are to be triggered using an action plan format where appropriate.  Total time for H and P, chart review, counseling and generating customized AVS unique to this office visit / same day charting = 20 min

## 2022-11-10 NOTE — Telephone Encounter (Signed)
TC to patient.  Patient did not get feno test before he left clinic on 11/09/2022.  Informed patient He needs to come back to clinic at his earliest convenience to get a feno test.  Patient states he will come 11/11/2022 around 12 noon.  Informed patient to ask for me and I would get feno test for Dr. Sherene Sires.  Patient verbalized understanding.

## 2022-11-11 LAB — POCT EXHALED NITRIC OXIDE: FeNO level (ppb): 31

## 2022-11-11 NOTE — Addendum Note (Signed)
Addended byClyda Greener M on: 11/11/2022 12:43 PM   Modules accepted: Orders

## 2022-11-11 NOTE — Telephone Encounter (Signed)
Called.  Spoke with patient.  Gave information.  Patient verbalized understanding.  Nothing further needed.

## 2022-11-11 NOTE — Telephone Encounter (Signed)
No change in rx

## 2022-11-16 ENCOUNTER — Other Ambulatory Visit: Payer: Self-pay | Admitting: Family Medicine

## 2022-11-16 DIAGNOSIS — I10 Essential (primary) hypertension: Secondary | ICD-10-CM

## 2022-11-17 ENCOUNTER — Other Ambulatory Visit: Payer: Self-pay | Admitting: Family Medicine

## 2022-11-17 DIAGNOSIS — I10 Essential (primary) hypertension: Secondary | ICD-10-CM

## 2022-12-05 ENCOUNTER — Other Ambulatory Visit: Payer: Self-pay | Admitting: Family Medicine

## 2022-12-05 DIAGNOSIS — A609 Anogenital herpesviral infection, unspecified: Secondary | ICD-10-CM

## 2022-12-06 ENCOUNTER — Encounter: Payer: Self-pay | Admitting: *Deleted

## 2022-12-12 ENCOUNTER — Other Ambulatory Visit: Payer: Self-pay | Admitting: Family Medicine

## 2022-12-12 DIAGNOSIS — E785 Hyperlipidemia, unspecified: Secondary | ICD-10-CM

## 2022-12-12 DIAGNOSIS — I10 Essential (primary) hypertension: Secondary | ICD-10-CM

## 2022-12-12 DIAGNOSIS — N401 Enlarged prostate with lower urinary tract symptoms: Secondary | ICD-10-CM

## 2022-12-12 DIAGNOSIS — R7309 Other abnormal glucose: Secondary | ICD-10-CM

## 2022-12-12 NOTE — Progress Notes (Deleted)
[facility] [address], [address] [place], [place] [address] [phone]  Date:  12/13/2022   Name:  Joshua [name]   DOB:  [birth year]   MRN:  [id number]  PCP:  Pearline Cables, MD    Chief Complaint: No chief complaint on file.   History of Present Illness:  Hilal Raimondo is a 72 y.o. very pleasant male patient who presents with the following:  Patient seen today with concern of back pain- History of hypertension, mild asthma, BPH, dyslipidemia, cataract  Most recent visit with myself was in November for some skin concerns-he also mentioned back pain previously, we did get some lumbar x-rays which show degenerative changes worsening since 2018.  He was doing physical therapy  Recommend shingles vaccine Colon cancer screening Can update blood work today if he would like He does have a urologist with [facility]-?  Does he need a PSA  Lumbar films dated 12/24/2021: Narrative & Impression  CLINICAL DATA:  Back pain, pain left lower extremity   EXAM: LUMBAR SPINE - COMPLETE 4+ VIEW   COMPARISON:  10/13/2016   FINDINGS: No recent fracture is seen. Alignment of posterior margins of vertebral bodies is within normal limits. Degenerative changes are noted with anterior bony spurs and facet hypertrophy, more so at L4-L5 and L5-S1 levels. There is mild narrowing of disc space at L4-L5 level. There is interval progression of degenerative changes.   IMPRESSION: No recent fracture is seen in the lumbar spine. Lumbar spondylosis, more so at L4-L5 and L5-S1 levels with interval progression.      Lipitor 10 Losartan 100 Wixela inhaler Uroxatral 10 daily  Albuterol as needed Patient Active Problem List   Diagnosis Date Noted   Rhinitis 06/09/2020   Healthcare maintenance 06/09/2020   Vitreous floater, bilateral 05/15/2019   Nuclear sclerotic cataract of both eyes 05/15/2019   Precordial chest pain 04/06/2018   Dyspnea on  exertion 04/06/2018   Dyslipidemia 04/06/2018   BPH with urinary obstruction 03/31/2018   Essential hypertension 01/17/2018   Solitary lung nodule 09/07/2017   Sinusitis, chronic/Cough 07/22/2016   Chronic asthma, mild persistent, uncomplicated 06/30/2016   Upper airway cough syndrome 06/29/2016    Past Medical History:  Diagnosis Date   Asthma    BPH (benign prostatic hyperplasia)    GERD (gastroesophageal reflux disease)    HLD (hyperlipidemia)    HSV infection    Hypertension    Kidney stones     Past Surgical History:  Procedure Laterality Date   APPENDECTOMY     BREAST CYST EXCISION Left    benign   LITHOTRIPSY     TONSILLECTOMY      Social History   Tobacco Use   Smoking status: Former    Packs/day: 0.50    Years: 22.00    Additional pack years: 0.00    Total pack years: 11.00    Types: Cigarettes    Quit date: 06/28/1993    Years since quitting: 29.4   Smokeless tobacco: Never  Vaping Use   Vaping Use: Never used  Substance Use Topics   Alcohol use: Yes    Comment: occasionally   Drug use: No    Family History  Problem Relation Age of Onset   Kidney Stones Mother    Alzheimer's disease Father    Colon cancer Paternal Grandfather 38   Stomach cancer Neg Hx    Esophageal cancer Neg Hx    Pancreatic cancer Neg  Hx     No Known Allergies  Medication list has been reviewed and updated.  Current Outpatient Medications on File Prior to Visit  Medication Sig Dispense Refill   acyclovir ointment (ZOVIRAX) 5 % Apply 1 application topically every 3 (three) hours. Use for one week as needed for outbreak 30 g 3   albuterol (VENTOLIN HFA) 108 (90 Base) MCG/ACT inhaler INHALE 1 TO 2 PUFFS INTO THE LUNGS EVERY 6 HOURS AS NEEDED FOR WHEEZING OR SHORTNESS OF BREATH 8.5 g 2   alfuzosin (UROXATRAL) 10 MG 24 hr tablet Take 1 tablet (10 mg total) by mouth daily. 90 tablet 3   atorvastatin (LIPITOR) 10 MG tablet TAKE 1 TABLET BY MOUTH DAILY 90 tablet 3   diclofenac  Sodium (VOLTAREN) 1 % GEL Apply 2 g topically 4 (four) times daily. Use as needed for pain 100 g 3   fluticasone-salmeterol (WIXELA INHUB) 100-50 MCG/ACT AEPB Inhale 1 puff into the lungs 2 (two) times daily.     losartan (COZAAR) 100 MG tablet Take 1 tablet (100 mg total) by mouth daily. 90 tablet 0   pantoprazole (PROTONIX) 20 MG tablet Take 30-60 min before first meal of the day 180 tablet 2   predniSONE (DELTASONE) 10 MG tablet Take  4 each am x 2 days,   2 each am x 2 days,  1 each am x 2 days and stop 14 tablet 0   tadalafil (CIALIS) 5 MG tablet Take 1 tablet (5 mg total) by mouth daily. Use daily for BPH 90 tablet 3   triamcinolone cream (KENALOG) 0.1 % Apply 1 Application topically 2 (two) times daily. Use as needed for dermatitis on leg 30 g 0   valACYclovir (VALTREX) 500 MG tablet TAKE 1 TABLET (500 MG TOTAL) BY MOUTH DAILY. 90 tablet 0   No current facility-administered medications on file prior to visit.    Review of Systems:  As per HPI- otherwise negative.    Physical Examination: There were no vitals filed for this visit. There were no vitals filed for this visit. There is no height or weight on file to calculate BMI. Ideal Body Weight:    GEN: no acute distress. HEENT: Atraumatic, Normocephalic.  Ears and Nose: No external deformity. CV: RRR, No M/G/R. No JVD. No thrill. No extra heart sounds. PULM: CTA B, no wheezes, crackles, rhonchi. No retractions. No resp. distress. No accessory muscle use. ABD: S, NT, ND, +BS. No rebound. No HSM. EXTR: No c/c/e PSYCH: Normally interactive. Conversant.    Assessment and Plan: ***  Signed Abbe Amsterdam, MD

## 2022-12-13 ENCOUNTER — Other Ambulatory Visit (INDEPENDENT_AMBULATORY_CARE_PROVIDER_SITE_OTHER): Payer: Medicare Other

## 2022-12-13 ENCOUNTER — Encounter: Payer: Self-pay | Admitting: Gastroenterology

## 2022-12-13 ENCOUNTER — Telehealth: Payer: Medicare Other | Admitting: Family Medicine

## 2022-12-13 DIAGNOSIS — N401 Enlarged prostate with lower urinary tract symptoms: Secondary | ICD-10-CM

## 2022-12-13 DIAGNOSIS — I10 Essential (primary) hypertension: Secondary | ICD-10-CM

## 2022-12-13 DIAGNOSIS — E785 Hyperlipidemia, unspecified: Secondary | ICD-10-CM

## 2022-12-13 DIAGNOSIS — Z1329 Encounter for screening for other suspected endocrine disorder: Secondary | ICD-10-CM

## 2022-12-13 DIAGNOSIS — Z5181 Encounter for therapeutic drug level monitoring: Secondary | ICD-10-CM

## 2022-12-13 DIAGNOSIS — R3914 Feeling of incomplete bladder emptying: Secondary | ICD-10-CM | POA: Diagnosis not present

## 2022-12-13 DIAGNOSIS — Z125 Encounter for screening for malignant neoplasm of prostate: Secondary | ICD-10-CM

## 2022-12-13 DIAGNOSIS — Z131 Encounter for screening for diabetes mellitus: Secondary | ICD-10-CM

## 2022-12-13 DIAGNOSIS — R5383 Other fatigue: Secondary | ICD-10-CM

## 2022-12-13 DIAGNOSIS — R7309 Other abnormal glucose: Secondary | ICD-10-CM

## 2022-12-13 DIAGNOSIS — Z13220 Encounter for screening for lipoid disorders: Secondary | ICD-10-CM

## 2022-12-13 DIAGNOSIS — Z130 Encounter for screening for diseases of the blood and blood-forming organs and certain disorders involving the immune mechanism: Secondary | ICD-10-CM

## 2022-12-13 LAB — COMPREHENSIVE METABOLIC PANEL
ALT: 15 U/L (ref 0–53)
AST: 16 U/L (ref 0–37)
Albumin: 4.1 g/dL (ref 3.5–5.2)
Alkaline Phosphatase: 60 U/L (ref 39–117)
BUN: 14 mg/dL (ref 6–23)
CO2: 28 mEq/L (ref 19–32)
Calcium: 8.9 mg/dL (ref 8.4–10.5)
Chloride: 104 mEq/L (ref 96–112)
Creatinine, Ser: 1.12 mg/dL (ref 0.40–1.50)
GFR: 66.33 mL/min (ref >60.00)
Glucose, Bld: 110 mg/dL — ABNORMAL HIGH (ref 70–99)
Potassium: 4 mEq/L (ref 3.5–5.1)
Sodium: 137 mEq/L (ref 135–145)
Total Bilirubin: 0.6 mg/dL (ref 0.2–1.2)
Total Protein: 6.8 g/dL (ref 6.0–8.3)

## 2022-12-13 LAB — PSA: PSA: 1.32 ng/mL (ref 0.10–4.00)

## 2022-12-13 LAB — CBC
HCT: 43.4 % (ref 39.0–52.0)
Hemoglobin: 14.2 g/dL (ref 13.0–17.0)
MCHC: 32.7 g/dL (ref 30.0–36.0)
MCV: 82.7 fl (ref 78.0–100.0)
Platelets: 271 10*3/uL (ref 150.0–400.0)
RBC: 5.25 Mil/uL (ref 4.22–5.81)
RDW: 13.7 % (ref 11.5–15.5)
WBC: 5.4 10*3/uL (ref 4.0–10.5)

## 2022-12-13 LAB — LIPID PANEL
Cholesterol: 172 mg/dL (ref 0–200)
HDL: 61 mg/dL (ref >39.00)
LDL Cholesterol: 97 mg/dL (ref 0–99)
NonHDL: 111.07
Total CHOL/HDL Ratio: 3
Triglycerides: 71 mg/dL (ref 0.0–149.0)
VLDL: 14.2 mg/dL (ref 0.0–40.0)

## 2022-12-13 LAB — HEMOGLOBIN A1C: Hgb A1c MFr Bld: 5.4 % (ref 4.6–6.5)

## 2022-12-14 ENCOUNTER — Encounter: Payer: Self-pay | Admitting: Family Medicine

## 2022-12-14 NOTE — Progress Notes (Signed)
Results for orders placed or performed in visit on 12/13/22  PSA  Result Value Ref Range   PSA 1.32 0.10 - 4.00 ng/mL  Lipid panel  Result Value Ref Range   Cholesterol 172 0 - 200 mg/dL   Triglycerides 16.1 0.0 - 149.0 mg/dL   HDL 09.60 >45.40 mg/dL   VLDL 98.1 0.0 - 19.1 mg/dL   LDL Cholesterol 97 0 - 99 mg/dL   Total CHOL/HDL Ratio 3    NonHDL 111.07   Hemoglobin A1c  Result Value Ref Range   Hgb A1c MFr Bld 5.4 4.6 - 6.5 %  Comprehensive metabolic panel  Result Value Ref Range   Sodium 137 135 - 145 mEq/L   Potassium 4.0 3.5 - 5.1 mEq/L   Chloride 104 96 - 112 mEq/L   CO2 28 19 - 32 mEq/L   Glucose, Bld 110 (H) 70 - 99 mg/dL   BUN 14 6 - 23 mg/dL   Creatinine, Ser 4.78 0.40 - 1.50 mg/dL   Total Bilirubin 0.6 0.2 - 1.2 mg/dL   Alkaline Phosphatase 60 39 - 117 U/L   AST 16 0 - 37 U/L   ALT 15 0 - 53 U/L   Total Protein 6.8 6.0 - 8.3 g/dL   Albumin 4.1 3.5 - 5.2 g/dL   GFR 29.56 >21.30 mL/min   Calcium 8.9 8.4 - 10.5 mg/dL  CBC  Result Value Ref Range   WBC 5.4 4.0 - 10.5 K/uL   RBC 5.25 4.22 - 5.81 Mil/uL   Platelets 271.0 150.0 - 400.0 K/uL   Hemoglobin 14.2 13.0 - 17.0 g/dL   HCT 86.5 78.4 - 69.6 %   MCV 82.7 78.0 - 100.0 fl   MCHC 32.7 30.0 - 36.0 g/dL   RDW 29.5 28.4 - 13.2 %

## 2022-12-17 DIAGNOSIS — W57XXXA Bitten or stung by nonvenomous insect and other nonvenomous arthropods, initial encounter: Secondary | ICD-10-CM | POA: Diagnosis not present

## 2022-12-17 DIAGNOSIS — R21 Rash and other nonspecific skin eruption: Secondary | ICD-10-CM | POA: Diagnosis not present

## 2023-01-04 ENCOUNTER — Ambulatory Visit: Payer: Medicare Other | Admitting: Internal Medicine

## 2023-01-05 DIAGNOSIS — Z87438 Personal history of other diseases of male genital organs: Secondary | ICD-10-CM | POA: Diagnosis not present

## 2023-01-05 DIAGNOSIS — N401 Enlarged prostate with lower urinary tract symptoms: Secondary | ICD-10-CM | POA: Diagnosis not present

## 2023-01-05 DIAGNOSIS — Z87442 Personal history of urinary calculi: Secondary | ICD-10-CM | POA: Diagnosis not present

## 2023-01-05 DIAGNOSIS — Z125 Encounter for screening for malignant neoplasm of prostate: Secondary | ICD-10-CM | POA: Diagnosis not present

## 2023-01-05 DIAGNOSIS — N138 Other obstructive and reflux uropathy: Secondary | ICD-10-CM | POA: Diagnosis not present

## 2023-01-12 NOTE — Progress Notes (Signed)
Healthcare at [facility] [address] [place], [place] [address] [phone]  Date:  01/17/2023   Name:  Joshua [name]   DOB:  1950-08-24   MRN:  [id number]  PCP:  Pearline Cables, MD    Chief Complaint: Follow-up (Concerns/ questions: 1. pt is still having some back pain- come and go. 2. Cramps in the legs at night. 3. Bruising easily. /Shingrix due- Medicare pt/AWV due/Colonoscopy: Sees [facility])   History of Present Illness:  Joshua [name] is a 72 y.o. very pleasant male patient who presents with the following:  Patient seen today for periodic follow-up Most recent visit with myself was in November History of hypertension, mild asthma, BPH, dyslipidemia, cataracts  His pulmonologist is Dr. Sherene Sires Urologist is Dr.Budzyn with [facility] Seen by his urologist earlier this month Visit with pulmonology in May He is also seen regularly by his ophthalmologist Lab work completed last month-CMP, lipid, CBC, A1c, PSA Can offer CT coronary calcium  Uroxatral 10 Lipitor 10 Wixela inhaler Losartan 100 Pantoprazole Cialis 5 mg daily  Shingles vaccine Recommend COVID booster Colon cancer screening may be due  He has noted some pain in his left hip and right lower back- the right sided pain may run down the leg  We did do x-rays a year ago:  12/24/21: IMPRESSION: No recent fracture is seen in the lumbar spine. Lumbar spondylosis, more so at L4-L5 and L5-S1 levels with interval progression  He may take a muscle relaxer as needed, he does not really feel like his back is getting worse-at this point he is able to live with it, he is certainly not interested in surgery His back bothers him more at night when he lays down  He has also had some leg cramps; we discussed some home remedies that may be helpful Also tennis elbow- he saw sports med and is improving   He may notice that his heartbeat will be irregular if he is feeling his  pulse at night- He is not actually having palpitations or feeling anything in his chest; he just may happen to check his pulse at the wrist and noticed some irregular beats He has noticed this for a month or so, does not occur every He only notes this at night when he lying quietly No CP   He had an echo in 2019- normal   He has noted that he seems to bruise easily for about 2 years- no other bleeding however We did labs for him recently and all was normal   Patient Active Problem List   Diagnosis Date Noted   Vitreous floater, bilateral 05/15/2019   Nuclear sclerotic cataract of both eyes 05/15/2019   Precordial chest pain 04/06/2018   Dyspnea on exertion 04/06/2018   Dyslipidemia 04/06/2018   BPH with urinary obstruction 03/31/2018   Essential hypertension 01/17/2018   Solitary lung nodule 09/07/2017   Sinusitis, chronic/Cough 07/22/2016   Chronic asthma, mild persistent, uncomplicated 06/30/2016   Upper airway cough syndrome 06/29/2016    Past Medical History:  Diagnosis Date   Asthma    BPH (benign prostatic hyperplasia)    GERD (gastroesophageal reflux disease)    HLD (hyperlipidemia)    HSV infection    Hypertension    Kidney stones     Past Surgical History:  Procedure Laterality Date   APPENDECTOMY     BREAST CYST EXCISION Left    benign   LITHOTRIPSY  TONSILLECTOMY      Social History   Tobacco Use   Smoking status: Former    Current packs/day: 0.00    Average packs/day: 0.5 packs/day for 22.0 years (11.0 ttl pk-yrs)    Types: Cigarettes    Start date: 06/29/1971    Quit date: 06/28/1993    Years since quitting: 29.5   Smokeless tobacco: Never  Vaping Use   Vaping status: Never Used  Substance Use Topics   Alcohol use: Yes    Comment: occasionally   Drug use: No    Family History  Problem Relation Age of Onset   Kidney Stones Mother    Alzheimer's disease Father    Colon cancer Paternal Grandfather 8   Stomach cancer Neg Hx    Esophageal  cancer Neg Hx    Pancreatic cancer Neg Hx     No Known Allergies  Medication list has been reviewed and updated.  Current Outpatient Medications on File Prior to Visit  Medication Sig Dispense Refill   acyclovir ointment (ZOVIRAX) 5 % Apply 1 application topically every 3 (three) hours. Use for one week as needed for outbreak 30 g 3   albuterol (VENTOLIN HFA) 108 (90 Base) MCG/ACT inhaler INHALE 1 TO 2 PUFFS INTO THE LUNGS EVERY 6 HOURS AS NEEDED FOR WHEEZING OR SHORTNESS OF BREATH 8.5 g 2   alfuzosin (UROXATRAL) 10 MG 24 hr tablet Take 1 tablet (10 mg total) by mouth daily. 90 tablet 3   atorvastatin (LIPITOR) 10 MG tablet TAKE 1 TABLET BY MOUTH DAILY 90 tablet 3   diclofenac Sodium (VOLTAREN) 1 % GEL Apply 2 g topically 4 (four) times daily. Use as needed for pain 100 g 3   fluticasone-salmeterol (WIXELA INHUB) 100-50 MCG/ACT AEPB Inhale 1 puff into the lungs 2 (two) times daily.     losartan (COZAAR) 100 MG tablet Take 1 tablet (100 mg total) by mouth daily. 90 tablet 0   pantoprazole (PROTONIX) 20 MG tablet Take 30-60 min before first meal of the day 180 tablet 2   tadalafil (CIALIS) 5 MG tablet Take 1 tablet (5 mg total) by mouth daily. Use daily for BPH 90 tablet 3   triamcinolone cream (KENALOG) 0.1 % Apply 1 Application topically 2 (two) times daily. Use as needed for dermatitis on leg 30 g 0   valACYclovir (VALTREX) 500 MG tablet TAKE 1 TABLET (500 MG TOTAL) BY MOUTH DAILY. 90 tablet 0   No current facility-administered medications on file prior to visit.    Review of Systems:  As per HPI- otherwise negative.   Physical Examination: Vitals:   01/17/23 1438  BP: 124/80  Pulse: 78  Resp: 18  Temp: 97.6 F (36.4 C)  SpO2: 97%   Vitals:   01/17/23 1438  Weight: 196 lb 3.2 oz (89 kg)  Height: 5\' 6"  (1.676 m)   Body mass index is 31.67 kg/m. Ideal Body Weight: Weight in (lb) to have BMI = 25: 154.6  GEN: no acute distress.  Mild obesity, looks well HEENT:  Atraumatic, Normocephalic.  Ears and Nose: No external deformity. CV: RRR, No M/G/R. No JVD. No thrill. No extra heart sounds. PULM: CTA B, no wheezes, crackles, rhonchi. No retractions. No resp. distress. No accessory muscle use. ABD: S, NT, ND, +BS. No rebound. No HSM. EXTR: No c/c/e PSYCH: Normally interactive. Conversant.   EKG: NSR, no arrhythmia noted Compared with previous from 11/21 no significant change noted   Assessment and Plan: Cardiac arrhythmia, unspecified cardiac arrhythmia type -  Plan: EKG 12-Lead  Dyslipidemia - Plan: atorvastatin (LIPITOR) 10 MG tablet  Essential hypertension - Plan: losartan (COZAAR) 100 MG tablet  Benign prostatic hyperplasia with incomplete bladder emptying - Plan: tadalafil (CIALIS) 5 MG tablet  HSV (herpes simplex virus) anogenital infection - Plan: acyclovir ointment (ZOVIRAX) 5 %  Patient seen today for follow-up.  As above, he is noted some occasional apparent arrhythmia.  EKG is normal.  I recommended that I order a Zio patch for him to get more information.  He declines at this time, he plans to schedule an appointment with his cardiologist Refilled atorvastatin-recent lab work from last month showed good control of lipids Blood pressure well-controlled, continue losartan Continue daily Cialis for BPH  We plan to follow his PSA bit more closely as it has trended up slightly.  Plan to recheck in 6 months  Lab Results  Component Value Date   PSA 1.32 12/13/2022   PSA 0.97 07/16/2021   PSA 1.04 10/13/2020     Signed Abbe Amsterdam, MD  Results for orders placed or performed in visit on 12/13/22  PSA  Result Value Ref Range   PSA 1.32 0.10 - 4.00 ng/mL  Lipid panel  Result Value Ref Range   Cholesterol 172 0 - 200 mg/dL   Triglycerides 16.1 0.0 - 149.0 mg/dL   HDL 09.60 >45.40 mg/dL   VLDL 98.1 0.0 - 19.1 mg/dL   LDL Cholesterol 97 0 - 99 mg/dL   Total CHOL/HDL Ratio 3    NonHDL 111.07   Hemoglobin A1c  Result Value Ref  Range   Hgb A1c MFr Bld 5.4 4.6 - 6.5 %  Comprehensive metabolic panel  Result Value Ref Range   Sodium 137 135 - 145 mEq/L   Potassium 4.0 3.5 - 5.1 mEq/L   Chloride 104 96 - 112 mEq/L   CO2 28 19 - 32 mEq/L   Glucose, Bld 110 (H) 70 - 99 mg/dL   BUN 14 6 - 23 mg/dL   Creatinine, Ser 4.78 0.40 - 1.50 mg/dL   Total Bilirubin 0.6 0.2 - 1.2 mg/dL   Alkaline Phosphatase 60 39 - 117 U/L   AST 16 0 - 37 U/L   ALT 15 0 - 53 U/L   Total Protein 6.8 6.0 - 8.3 g/dL   Albumin 4.1 3.5 - 5.2 g/dL   GFR 29.56 >21.30 mL/min   Calcium 8.9 8.4 - 10.5 mg/dL  CBC  Result Value Ref Range   WBC 5.4 4.0 - 10.5 K/uL   RBC 5.25 4.22 - 5.81 Mil/uL   Platelets 271.0 150.0 - 400.0 K/uL   Hemoglobin 14.2 13.0 - 17.0 g/dL   HCT 86.5 78.4 - 69.6 %   MCV 82.7 78.0 - 100.0 fl   MCHC 32.7 30.0 - 36.0 g/dL   RDW 29.5 28.4 - 13.2 %

## 2023-01-12 NOTE — Patient Instructions (Addendum)
It was great to see you again today!  Assuming all is well please see me in about 6 months  If not already, I would suggest that you get the shingles vaccine series Also recommended COVID booster if not done the last 6 months or so  Here is a copy of your recent lab work for your reference, all looks good!  Let's plan to do a PSA in 6 months   Results for orders placed or performed in visit on 12/13/22  PSA  Result Value Ref Range   PSA 1.32 0.10 - 4.00 ng/mL  Lipid panel  Result Value Ref Range   Cholesterol 172 0 - 200 mg/dL   Triglycerides 82.9 0.0 - 149.0 mg/dL   HDL 56.21 >30.86 mg/dL   VLDL 57.8 0.0 - 46.9 mg/dL   LDL Cholesterol 97 0 - 99 mg/dL   Total CHOL/HDL Ratio 3    NonHDL 111.07   Hemoglobin A1c  Result Value Ref Range   Hgb A1c MFr Bld 5.4 4.6 - 6.5 %  Comprehensive metabolic panel  Result Value Ref Range   Sodium 137 135 - 145 mEq/L   Potassium 4.0 3.5 - 5.1 mEq/L   Chloride 104 96 - 112 mEq/L   CO2 28 19 - 32 mEq/L   Glucose, Bld 110 (H) 70 - 99 mg/dL   BUN 14 6 - 23 mg/dL   Creatinine, Ser 6.29 0.40 - 1.50 mg/dL   Total Bilirubin 0.6 0.2 - 1.2 mg/dL   Alkaline Phosphatase 60 39 - 117 U/L   AST 16 0 - 37 U/L   ALT 15 0 - 53 U/L   Total Protein 6.8 6.0 - 8.3 g/dL   Albumin 4.1 3.5 - 5.2 g/dL   GFR 52.84 >13.24 mL/min   Calcium 8.9 8.4 - 10.5 mg/dL  CBC  Result Value Ref Range   WBC 5.4 4.0 - 10.5 K/uL   RBC 5.25 4.22 - 5.81 Mil/uL   Platelets 271.0 150.0 - 400.0 K/uL   Hemoglobin 14.2 13.0 - 17.0 g/dL   HCT 40.1 02.7 - 25.3 %   MCV 82.7 78.0 - 100.0 fl   MCHC 32.7 30.0 - 36.0 g/dL   RDW 66.4 40.3 - 47.4 %

## 2023-01-13 ENCOUNTER — Telehealth: Payer: Self-pay | Admitting: Family Medicine

## 2023-01-13 NOTE — Telephone Encounter (Signed)
Copied from CRM [id number]. Topic: Medicare AWV >> Jan 13, 2023  9:56 AM Payton Doughty wrote: Reason for CRM: LM 01/13/2023 to schedule AWV   Verlee Rossetti; Care Guide Ambulatory Clinical Support [place] [facility] Direct Dial: [phone]

## 2023-01-17 ENCOUNTER — Ambulatory Visit (INDEPENDENT_AMBULATORY_CARE_PROVIDER_SITE_OTHER): Payer: Medicare Other | Admitting: Family Medicine

## 2023-01-17 VITALS — BP 124/80 | HR 78 | Temp 97.6°F | Resp 18 | Ht 66.0 in | Wt 196.2 lb

## 2023-01-17 DIAGNOSIS — E785 Hyperlipidemia, unspecified: Secondary | ICD-10-CM | POA: Diagnosis not present

## 2023-01-17 DIAGNOSIS — I10 Essential (primary) hypertension: Secondary | ICD-10-CM | POA: Diagnosis not present

## 2023-01-17 DIAGNOSIS — R3914 Feeling of incomplete bladder emptying: Secondary | ICD-10-CM

## 2023-01-17 DIAGNOSIS — A609 Anogenital herpesviral infection, unspecified: Secondary | ICD-10-CM | POA: Diagnosis not present

## 2023-01-17 DIAGNOSIS — N401 Enlarged prostate with lower urinary tract symptoms: Secondary | ICD-10-CM | POA: Diagnosis not present

## 2023-01-17 DIAGNOSIS — I499 Cardiac arrhythmia, unspecified: Secondary | ICD-10-CM | POA: Diagnosis not present

## 2023-01-17 MED ORDER — LOSARTAN POTASSIUM 100 MG PO TABS: 100.0000 mg | ORAL_TABLET | Freq: Every day | ORAL | 3 refills | Status: DC

## 2023-01-17 MED ORDER — ATORVASTATIN CALCIUM 10 MG PO TABS: ORAL_TABLET | ORAL | 3 refills | Status: DC

## 2023-01-17 MED ORDER — ACYCLOVIR 5 % EX OINT: 1.0000 | TOPICAL_OINTMENT | CUTANEOUS | 3 refills | Status: AC

## 2023-01-17 MED ORDER — TADALAFIL 5 MG PO TABS: 5.0000 mg | ORAL_TABLET | Freq: Every day | ORAL | 3 refills | Status: DC

## 2023-01-19 ENCOUNTER — Encounter: Payer: Self-pay | Admitting: Family Medicine

## 2023-01-19 ENCOUNTER — Telehealth: Payer: Self-pay

## 2023-01-19 DIAGNOSIS — G8929 Other chronic pain: Secondary | ICD-10-CM

## 2023-01-19 DIAGNOSIS — M5432 Sciatica, left side: Secondary | ICD-10-CM

## 2023-01-19 NOTE — Telephone Encounter (Signed)
PA approved. Effective 06/28/2022 - 01/19/2024

## 2023-01-19 NOTE — Telephone Encounter (Signed)
PA initiated via Covermymeds; KEY: [id number]. Awaiting determination.

## 2023-01-20 DIAGNOSIS — L249 Irritant contact dermatitis, unspecified cause: Secondary | ICD-10-CM | POA: Diagnosis not present

## 2023-01-21 ENCOUNTER — Other Ambulatory Visit: Payer: Self-pay | Admitting: Family Medicine

## 2023-01-21 DIAGNOSIS — G8929 Other chronic pain: Secondary | ICD-10-CM

## 2023-01-21 DIAGNOSIS — M5432 Sciatica, left side: Secondary | ICD-10-CM

## 2023-01-21 MED ORDER — METHOCARBAMOL 500 MG PO TABS: 500.0000 mg | ORAL_TABLET | Freq: Three times a day (TID) | ORAL | 1 refills | Status: DC | PRN

## 2023-01-21 NOTE — Addendum Note (Signed)
Addended by: Abbe Amsterdam C on: 01/21/2023 12:02 PM   Modules accepted: Orders

## 2023-01-22 ENCOUNTER — Other Ambulatory Visit: Payer: Self-pay | Admitting: Internal Medicine

## 2023-02-08 ENCOUNTER — Encounter: Payer: Self-pay | Admitting: Internal Medicine

## 2023-02-08 ENCOUNTER — Ambulatory Visit (INDEPENDENT_AMBULATORY_CARE_PROVIDER_SITE_OTHER): Payer: Medicare Other | Admitting: Internal Medicine

## 2023-02-08 VITALS — BP 130/70 | HR 69 | Resp 16 | Ht 66.0 in | Wt 198.0 lb

## 2023-02-08 DIAGNOSIS — J4530 Mild persistent asthma, uncomplicated: Secondary | ICD-10-CM

## 2023-02-08 MED ORDER — BUDESONIDE-FORMOTEROL FUMARATE 80-4.5 MCG/ACT IN AERO: INHALATION_SPRAY | RESPIRATORY_TRACT | Status: DC

## 2023-02-08 NOTE — Assessment & Plan Note (Addendum)
Onset around 2000 in [place] with allergy testing neg there 06/29/2016    reduce symbicort to 80 2 bid  - FENO 07/21/2016  =   24 on symb 80 2bid - Spirometry 07/21/2016  wnl including  Mid flows p am symbicort > try off > flared so restarted  - 09/05/2017    try symb increase to 160  - PFT's  10/03/2017  FEV1 2.57 (88 % ) ratio 67  p 21 % improvement from saba p symb 160 prior to study with DLCO  108 % corrects to 113 % for alv volume  With variable truncation of insp portion of f/v loop and reported better benefit from 80 than 160 strength c/w uacs / vcd component  - 05/31/2018 flare on symb 80 2bid  - 07/18/2018 added singulair trial > d/c 01/23/2019  - 07/18/18 Spirometry   FEV1 2.6 (88%)  Ratio 0.80  - 09/01/2018  After extensive coaching inhaler device,  effectiveness =    75% (short Ti) rec try symb 80 just one bid to see if helps hoarseness or flares asthma > consider adding spacer next  - try off singulair 01/23/2019  - 11/09/2022  After extensive coaching inhaler device,  effectiveness =    80% (short ti) / 90% with wixella device > ok to try wixella but keep dose 100 one bid due to tendency to cough on higher doses of advair - 02/08/2023 changed back to symbicort due to perceived side effects from wixella = palpitations / slt throat irritation   Pt advised: Advised:  formulary restrictions will be an ongoing challenge for the forseable future and I would be happy to pick an alternative if the pt will first  provide me a list of them -  pt  will need to return here for training for any new device that is required eg dpi vs hfa vs respimat.    In the meantime we can always provide samples so that the patient never runs out of any needed respiratory medications.   F/u in 3 m, sooner prn          Each maintenance medication was reviewed in detail including emphasizing most importantly the difference between maintenance and prns and under what circumstances the prns are to be triggered using an action plan  format where appropriate.  Total time for H and P, chart review, counseling, reviewing hfa / dpi  device(s) and generating customized AVS unique to this office visit / same day charting = 24 min

## 2023-02-08 NOTE — Progress Notes (Signed)
Subjective:    Patient ID: Joshua [name], male   DOB: 1950-08-14    MRN: [id number]    Brief patient profile:  72 yo [place] male  with bad bronchitis onset  around 1993 and quit smoking and then  all better until around 2000 while living in [place] with intermittent "wheezing" dx as asthma > never resolved  Self referred for dtc asthma  Allergy testing in [place] around age 55 = neg per pt    History of Present Illness  06/29/2016 1st [facility] office visit/    Chief Complaint  Patient presents with   Pulmonary Consult    moved from [place], chronic bronchitis, wheezing, dx with asthma in 2000, cold humidity makes it worse  maint on symb 160 2bid and at baseline intermittently noisy breathing one breath q 2-3 days  s pattern though not waking him  noct   Worse since  Jun 22 2016  with uri/ sinus symptoms >  [facility] rx  Amox/only a little better, still severe nasal congestion/severe fits of day > noct cough and subj wheeze.  Typical triggers are uri/not seasonal pattern rhintiis - happens up to 4 x yearly with or without symbicort and just as severe. rec Augmentin 875 mg take one pill twice daily  X 10 days   Please see patient coordinator before you leave today  to schedule CT sinus  in 10 days - no sooner Use nasal saline as much as possible to help moisturize your passages  Continue protonix but take it Take 30-60 min before first meal of the day  Prednisone 10 mg take  4 each am x 2 days,   2 each am x 2 days,  1 each am x 2 days and stop  Work on inhaler technique:  Plan A = Automatic = symbicort 80 Take 2 puffs first thing in am and then another 2 puffs about 12 hours later.  Plan B = Backup Only use your albuterol (Proir) as a rescue medication to be used if you can't catch your breath by resting or doing a relaxed purse lip breathing pattern.  - The less you use it, the better it will work when you need it. - Ok to use the inhaler up to 2 puffs  every 4 hours      07/21/2016  f/u ov/ re: uacs vs asthma Chief Complaint  Patient presents with   Follow-up    Cough is much improved, but has not completely resolved. Breathing has improved.   rec Use lots of saline nasal spray on your nose Please remember to go to the x-ray department downstairs for your tests - we will call you with the results when they are available. Ty off the symbicort to see if any worse and if so restart  At 2 pffs every 12 hour schedule ENT  >   [name] rec abx and f/u prn     11/17/2021  f/u ov/ re: uacs vs cough variant asthma maint on symbicort 80 2bid   Chief Complaint  Patient presents with   Follow-up    No complaints.  Rec Plan A = Automatic = Always=    symbicort 80 Take 2 puffs first thing in am and then another 2 puffs about 12 hours later.  Work on inhaler technique:   Plan B = Backup (to supplement plan A, not to replace it) Only use your albuterol inhaler as a rescue medication      11/09/2022  f/u  ov/ re: cough variant asthma maint on symbicort 80 / worse control with pollen / insurance wants him to try wixella  Chief Complaint  Patient presents with   Follow-up    Discuss medications.  Some wheeze with increased pollen outdoors.  Increased use of inhaler, especially at night.  Insurance changed inhaler from Symbicort to [employer].  Patient states Advair Discus in past caused hoarsness.    Dyspnea:  MMRC1 = can walk nl pace, flat grade, can't hurry or go uphills or steps s sob  [place] can be limited by doe at times depending on weather / pollen  Cough: min mucoid/ remembers worse on advair and high dose symbicort  Sleeping: poorly due to cough/ wheeze/need for noct saba  SABA use: increased x sev weeks  02: none Rec Prednisone 10 mg take  4 each am x 2 days,   2 each am x 2 days,  1 each am x 2 days and stop  Wixella 100 one click  twice daily and if not satisfactory call for a Prior Approval for symbicort 80 - try arm and hammer  toothpaste after each  For flares of cough /wheezing > Pantoprazole (protonix) 40 mg   Take  30-60 min before first meal of the day and Pepcid (famotidine)  20 mg after supper until cough is gone     02/08/2023  f/u ov/ re: asthma   maint on  wixella 100/50   Chief Complaint  Patient presents with   Follow-up    Side effect from  meds     Dyspnea:  walking 30 m s stopping sev times a weeks s sob or palpitions "just tired"  Cough: none  Sleeping: level bed with 2 pillows occ waking up with sob and palp sometimes better p saba, sometimes better s saba  SABA use: rarely dayime 02: none      No obvious day to day or daytime variability or assoc excess/ purulent sputum or mucus plugs or hemoptysis or cp or chest tightness, subjective wheeze or overt sinus or hb symptoms.    Also denies any obvious fluctuation of symptoms with weather or environmental changes or other aggravating or alleviating factors except as outlined above   No unusual exposure hx or h/o childhood pna/ asthma or knowledge of premature birth.  Current Allergies, Complete Past Medical History, Past Surgical History, Family History, and Social History were reviewed in [employer] record.  ROS  The following are not active complaints unless bolded Hoarseness, sore throat, dysphagia, dental problems, itching, sneezing,  nasal congestion or discharge of excess mucus or purulent secretions, ear ache,   fever, chills, sweats, unintended wt loss or wt gain, classically pleuritic or exertional cp,  orthopnea pnd or arm/hand swelling  or leg swelling, presyncope, palpitations, abdominal pain, anorexia, nausea, vomiting, diarrhea  or change in bowel habits or change in bladder habits, change in stools or change in urine, dysuria, hematuria,  rash, arthralgias, visual complaints, headache, numbness, weakness or ataxia or problems with walking or coordination,  change in mood or  memory.        Current Meds   Medication Sig   acyclovir ointment (ZOVIRAX) 5 % Apply 1 Application topically every 3 (three) hours. Use for one week as needed for outbreak   albuterol (VENTOLIN HFA) 108 (90 Base) MCG/ACT inhaler INHALE 1 TO 2 PUFFS INTO THE LUNGS EVERY 6 HOURS AS NEEDED FOR WHEEZING OR SHORTNESS OF BREATH   alfuzosin (UROXATRAL) 10 MG 24 hr  tablet Take 1 tablet (10 mg total) by mouth daily.   atorvastatin (LIPITOR) 10 MG tablet TAKE 1 TABLET BY MOUTH DAILY   diclofenac Sodium (VOLTAREN) 1 % GEL Apply 2 g topically 4 (four) times daily. Use as needed for pain   fluticasone-salmeterol (WIXELA INHUB) 100-50 MCG/ACT AEPB Inhale 1 puff into the lungs 2 (two) times daily.   losartan (COZAAR) 100 MG tablet Take 1 tablet (100 mg total) by mouth daily.   methocarbamol (ROBAXIN) 500 MG tablet Take 1 tablet (500 mg total) by mouth every 8 (eight) hours as needed for muscle spasms.   pantoprazole (PROTONIX) 20 MG tablet Take 30-60 min before first meal of the day   tadalafil (CIALIS) 5 MG tablet Take 1 tablet (5 mg total) by mouth daily. Use daily for BPH   triamcinolone cream (KENALOG) 0.1 % Apply 1 Application topically 2 (two) times daily. Use as needed for dermatitis on leg   valACYclovir (VALTREX) 500 MG tablet TAKE 1 TABLET (500 MG TOTAL) BY MOUTH DAILY.                         Objective:   Physical Exam  Wts  02/08/2023       198  11/09/2022       199  06/08/2022     196   11/17/2021       198  04/16/2021     195  07/28/2020       203 07/30/2019         203  01/23/2019       201  09/01/2018         205  07/18/2018       200     02/28/2018          203   10/03/2017         204   09/05/2017       204 07/21/16 196 lb (88.9 kg)  06/29/16 197 lb 12.8 oz (89.7 kg)    Vital signs reviewed  02/08/2023  - Note at rest 02 sats  96% on RA   General appearance:    amb [place] male  nad      HEENT : Oropharynx  clear/ no thrush  / top denture        NECK :  without  apparent JVD/ palpable Nodes/TM     LUNGS: no acc muscle use,  Nl contour chest which is clear to A and P bilaterally without cough on insp or exp maneuvers   CV:  RRR  no s3 or murmur or increase in P2, and no edema   ABD:  soft and nontender with nl inspiratory excursion in the supine position. No bruits or organomegaly appreciated   MS:  Nl gait/ ext warm without deformities Or obvious joint restrictions  calf tenderness, cyanosis or clubbing    SKIN: warm and dry without lesions    NEURO:  alert, approp, nl sensorium with  no motor or cerebellar deficits apparent.              Assessment:

## 2023-02-08 NOTE — Patient Instructions (Addendum)
Plan A = Automatic = Always=  Symbicort 80 Take 2 puffs first thing in am and then another 2 puffs about 12 hours later.     Plan B = Backup (to supplement plan A, not to replace it) Only use your albuterol inhaler as a rescue medication to be used if you can't catch your breath by resting or doing a relaxed purse lip breathing pattern.  - The less you use it, the better it will work when you need it. - Ok to use the inhaler up to 2 puffs  every 4 hours if you must but call for appointment if use goes up over your usual need - Don't leave home without it !!  (think of it like the spare tire for your car)     Please schedule a follow up visit in 3 months but call sooner if needed - bring inhalers

## 2023-02-25 ENCOUNTER — Ambulatory Visit (AMBULATORY_SURGERY_CENTER): Payer: Medicare Other | Admitting: *Deleted

## 2023-02-25 VITALS — Ht 66.0 in | Wt 196.0 lb

## 2023-02-25 DIAGNOSIS — Z86010 Personal history of colonic polyps: Secondary | ICD-10-CM

## 2023-02-25 MED ORDER — NA SULFATE-K SULFATE-MG SULF 17.5-3.13-1.6 GM/177ML PO SOLN
1.0000 | Freq: Once | ORAL | 0 refills | Status: AC
Start: 2023-02-25 — End: 2023-02-25

## 2023-02-25 NOTE — Progress Notes (Signed)
Pt's name and DOB verified at the beginning of the pre-visit.  Pt denies any difficulty with ambulating,sitting, laying down or rolling side to side Gave both [facility] main # and MD on call # prior to instructions.  No egg or soy allergy known to patient  No issues known to pt with past sedation with any surgeries or procedures Pt denies having issues being intubated Pt has no issues moving head neck or swallowing No FH of Malignant Hyperthermia Pt is not on diet pills Pt is not on home 02  Pt is not on blood thinners  Pt denies issues with constipation  Pt is not on dialysis Pt denise any abnormal heart rhythms  Pt denies any upcoming cardiac testing Pt encouraged to use to use Singlecare or Goodrx to reduce cost  Patient's chart reviewed by Cathlyn Parsons CNRA prior to pre-visit and patient appropriate for the [facility].  Pre-visit completed and red dot placed by patient's name on their procedure day (on provider's schedule).  . Visit by phone Pt states weight is 196 lb Instructed pt why it is important to and  to call if they have any changes in health or new medications. Directed them to the # given and on instructions.   Pt states they will.  Instructions reviewed with pt and pt states understanding. Instructed to review again prior to procedure. Pt states they will.  Instructions sent by mail with coupon and by my chart

## 2023-03-22 ENCOUNTER — Encounter: Payer: Self-pay | Admitting: Gastroenterology

## 2023-03-22 ENCOUNTER — Ambulatory Visit (AMBULATORY_SURGERY_CENTER): Payer: Medicare Other | Admitting: Gastroenterology

## 2023-03-22 VITALS — BP 131/76 | HR 72 | Temp 97.7°F | Resp 17 | Ht 66.0 in | Wt 196.0 lb

## 2023-03-22 DIAGNOSIS — Z86010 Personal history of colonic polyps: Secondary | ICD-10-CM

## 2023-03-22 DIAGNOSIS — D123 Benign neoplasm of transverse colon: Secondary | ICD-10-CM | POA: Diagnosis not present

## 2023-03-22 DIAGNOSIS — Z09 Encounter for follow-up examination after completed treatment for conditions other than malignant neoplasm: Secondary | ICD-10-CM | POA: Diagnosis not present

## 2023-03-22 DIAGNOSIS — K635 Polyp of colon: Secondary | ICD-10-CM

## 2023-03-22 DIAGNOSIS — D125 Benign neoplasm of sigmoid colon: Secondary | ICD-10-CM | POA: Diagnosis not present

## 2023-03-22 DIAGNOSIS — I10 Essential (primary) hypertension: Secondary | ICD-10-CM | POA: Diagnosis not present

## 2023-03-22 DIAGNOSIS — J45909 Unspecified asthma, uncomplicated: Secondary | ICD-10-CM | POA: Diagnosis not present

## 2023-03-22 MED ORDER — SODIUM CHLORIDE 0.9 % IV SOLN: 500.0000 mL | Freq: Once | INTRAVENOUS | Status: DC

## 2023-03-22 NOTE — Progress Notes (Signed)
Pt's states no medical or surgical changes since previsit or office visit. 

## 2023-03-22 NOTE — Progress Notes (Signed)
Called to room to assist during endoscopic procedure.  Patient ID and intended procedure confirmed with present staff. Received instructions for my participation in the procedure from the performing physician.  

## 2023-03-22 NOTE — Progress Notes (Signed)
[facility] History and Physical   Primary Care Physician:  [name], Joshua Found, MD   Reason for Procedure:   History of colon polyps  Plan:    colonoscopy   HPI: Joshua [name] is a 72 y.o. male  here for colonoscopy surveillance - 2 sessile serrated polyps removed 10/2017.   No family history of colon cancer known. Otherwise feels well without any cardiopulmonary symptoms.   I have discussed risks / benefits of anesthesia and endoscopic procedure with Joshua [name] and they wish to proceed with the exams as outlined today.    Past Medical History:  Diagnosis Date   Arthritis    Asthma    BPH (benign prostatic hyperplasia)    GERD (gastroesophageal reflux disease)    HLD (hyperlipidemia)    HSV infection    Hypertension    Kidney stones     Past Surgical History:  Procedure Laterality Date   APPENDECTOMY     BREAST CYST EXCISION Left    benign   LITHOTRIPSY     TONSILLECTOMY      Prior to Admission medications   Medication Sig Start Date End Date Taking? Authorizing Provider  albuterol (VENTOLIN HFA) 108 (90 Base) MCG/ACT inhaler INHALE 1 TO 2 PUFFS INTO THE LUNGS EVERY 6 HOURS AS NEEDED FOR WHEEZING OR SHORTNESS OF BREATH 01/24/23  Yes Joshua Cowden, MD  alfuzosin (UROXATRAL) 10 MG 24 hr tablet Take 1 tablet (10 mg total) by mouth daily. 02/06/18  Yes [name], Joshua Found, MD  atorvastatin (LIPITOR) 10 MG tablet TAKE 1 TABLET BY MOUTH DAILY 01/17/23  Yes [name], Joshua Found, MD  budesonide-formoterol (SYMBICORT) 80-4.5 MCG/ACT inhaler Take 2 puffs first thing in am and then another 2 puffs about 12 hours later. 02/08/23  Yes Joshua Cowden, MD  Cholecalciferol (D3 2000) 50 MCG (2000 UT) CAPS Take by mouth.   Yes [provider]  diclofenac Sodium (VOLTAREN) 1 % GEL Apply 2 g topically 4 (four) times daily. Use as needed for pain 01/14/22  Yes [name], Joshua Found, MD  losartan (COZAAR) 100 MG tablet Take 1 tablet (100 mg total) by mouth daily. 01/17/23  Yes  [name], Joshua Found, MD  pantoprazole (PROTONIX) 20 MG tablet Take 30-60 min before first meal of the day 11/09/22  Yes Joshua Cowden, MD  valACYclovir (VALTREX) 500 MG tablet TAKE 1 TABLET (500 MG TOTAL) BY MOUTH DAILY. 12/06/22  Yes [name], Joshua Found, MD  acyclovir ointment (ZOVIRAX) 5 % Apply 1 Application topically every 3 (three) hours. Use for one week as needed for outbreak 01/17/23   [name], Joshua Found, MD  alclomethasone (ACLOVATE) 0.05 % cream Apply 1 Application topically 2 (two) times daily. 01/20/23   [provider]  magnesium 30 MG tablet Take 30 mg by mouth 2 (two) times daily.    [provider]  methocarbamol (ROBAXIN) 500 MG tablet Take 1 tablet (500 mg total) by mouth every 8 (eight) hours as needed for muscle spasms. Patient not taking: Reported on 02/25/2023 01/21/23   [name], Joshua Found, MD  tadalafil (CIALIS) 5 MG tablet Take 1 tablet (5 mg total) by mouth daily. Use daily for BPH 01/17/23   [name], Joshua Found, MD  triamcinolone cream (KENALOG) 0.1 % Apply 1 Application topically 2 (two) times daily. Use as needed for dermatitis on leg Patient not taking: Reported on 02/25/2023 05/17/22   [name], Joshua Found, MD    Current Outpatient Medications  Medication Sig Dispense Refill   albuterol (VENTOLIN HFA)  108 (90 Base) MCG/ACT inhaler INHALE 1 TO 2 PUFFS INTO THE LUNGS EVERY 6 HOURS AS NEEDED FOR WHEEZING OR SHORTNESS OF BREATH 8.5 g 2   alfuzosin (UROXATRAL) 10 MG 24 hr tablet Take 1 tablet (10 mg total) by mouth daily. 90 tablet 3   atorvastatin (LIPITOR) 10 MG tablet TAKE 1 TABLET BY MOUTH DAILY 90 tablet 3   budesonide-formoterol (SYMBICORT) 80-4.5 MCG/ACT inhaler Take 2 puffs first thing in am and then another 2 puffs about 12 hours later.     Cholecalciferol (D3 2000) 50 MCG (2000 UT) CAPS Take by mouth.     diclofenac Sodium (VOLTAREN) 1 % GEL Apply 2 g topically 4 (four) times daily. Use as needed for pain 100 g 3   losartan (COZAAR) 100 MG tablet Take 1  tablet (100 mg total) by mouth daily. 90 tablet 3   pantoprazole (PROTONIX) 20 MG tablet Take 30-60 min before first meal of the day 180 tablet 2   valACYclovir (VALTREX) 500 MG tablet TAKE 1 TABLET (500 MG TOTAL) BY MOUTH DAILY. 90 tablet 0   acyclovir ointment (ZOVIRAX) 5 % Apply 1 Application topically every 3 (three) hours. Use for one week as needed for outbreak 30 g 3   alclomethasone (ACLOVATE) 0.05 % cream Apply 1 Application topically 2 (two) times daily.     magnesium 30 MG tablet Take 30 mg by mouth 2 (two) times daily.     methocarbamol (ROBAXIN) 500 MG tablet Take 1 tablet (500 mg total) by mouth every 8 (eight) hours as needed for muscle spasms. (Patient not taking: Reported on 02/25/2023) 40 tablet 1   tadalafil (CIALIS) 5 MG tablet Take 1 tablet (5 mg total) by mouth daily. Use daily for BPH 90 tablet 3   triamcinolone cream (KENALOG) 0.1 % Apply 1 Application topically 2 (two) times daily. Use as needed for dermatitis on leg (Patient not taking: Reported on 02/25/2023) 30 g 0   Current Facility-Administered Medications  Medication Dose Route Frequency Provider Last Rate Last Admin   0.9 %  sodium chloride infusion  500 mL Intravenous Once Joshua [name], Joshua Rayas, MD        Allergies as of 03/22/2023   (No Known Allergies)    Family History  Problem Relation Age of Onset   Kidney Stones Mother    Alzheimer's disease Father    Prostate cancer Maternal Grandfather    Colon cancer Paternal Grandfather 89   Stomach cancer Neg Hx    Esophageal cancer Neg Hx    Pancreatic cancer Neg Hx    Crohn's disease Neg Hx    Rectal cancer Neg Hx     Social History   Socioeconomic History   Marital status: Married    Spouse name: Not on file   Number of children: 2   Years of education: Not on file   Highest education level: Master's degree (e.g., MA, MS, MEng, MEd, MSW, MBA)  Occupational History   Occupation: Engineer, technical sales  Tobacco Use   Smoking status: Former    Current  packs/day: 0.00    Average packs/day: 0.5 packs/day for 22.0 years (11.0 ttl pk-yrs)    Types: Cigarettes    Start date: 06/29/1971    Quit date: 06/28/1993    Years since quitting: 29.7   Smokeless tobacco: Never  Vaping Use   Vaping status: Never Used  Substance and Sexual Activity   Alcohol use: Yes    Comment: occasionally   Drug use: No   Sexual  activity: Not on file  Other Topics Concern   Not on file  Social History Narrative   Not on file   Social Determinants of Health   Financial Resource Strain: Low Risk  (01/16/2023)   Overall Financial Resource Strain (CARDIA)    Difficulty of Paying Living Expenses: Not very hard  Food Insecurity: No Food Insecurity (01/16/2023)   Hunger Vital Sign    Worried About Running Out of Food in the Last Year: Never true    Ran Out of Food in the Last Year: Never true  Transportation Needs: No Transportation Needs (01/16/2023)   PRAPARE - Administrator, Civil Service (Medical): No    Lack of Transportation (Non-Medical): No  Physical Activity: Insufficiently Active (01/16/2023)   Exercise Vital Sign    Days of Exercise per Week: 3 days    Minutes of Exercise per Session: 30 min  Stress: No Stress Concern Present (01/16/2023)   [employer] of Occupational Health - Occupational Stress Questionnaire    Feeling of Stress : Not at all  Social Connections: Unknown (01/16/2023)   Social Connection and Isolation Panel [NHANES]    Frequency of Communication with Friends and Family: Three times a week    Frequency of Social Gatherings with Friends and Family: Once a week    Attends Religious Services: Patient declined    Database administrator or Organizations: No    Attends Engineer, structural: Not on file    Marital Status: Married  Intimate Partner Violence: Unknown (07/14/2022)   Received from [employer], [facility]   HITS    Physically Hurt: Not on file    Insult or Talk Down To: Not on file    Threaten  Physical Harm: Not on file    Scream or Curse: Not on file    Review of Systems: All other review of systems negative except as mentioned in the HPI.  Physical Exam: Vital signs BP (!) 155/97   Pulse 81   Temp 97.7 F (36.5 C)   Ht 5\' 6"  (1.676 m)   Wt 196 lb (88.9 kg)   SpO2 97%   BMI 31.64 kg/m   General:   Alert,  Well-developed, pleasant and cooperative in NAD Lungs:  Clear throughout to auscultation.   Heart:  Regular rate and rhythm Abdomen:  Soft, nontender and nondistended.   Neuro/Psych:  Alert and cooperative. Normal mood and affect. A and O x 3  Harlin Rain, MD [facility]

## 2023-03-22 NOTE — Patient Instructions (Addendum)
Resume previous diet Continue present medications Await pathology results Handouts/ information given for polyps, diverticulosis and hemorrhoids  YOU HAD AN ENDOSCOPIC PROCEDURE TODAY AT THE [facility]:   Refer to the procedure report that was given to you for any specific questions about what was found during the examination.  If the procedure report does not answer your questions, please call your gastroenterologist to clarify.  If you requested that your care partner not be given the details of your procedure findings, then the procedure report has been included in a sealed envelope for you to review at your convenience later.  YOU SHOULD EXPECT: Some feelings of bloating in the abdomen. Passage of more gas than usual.  Walking can help get rid of the air that was put into your GI tract during the procedure and reduce the bloating. If you had a lower endoscopy (such as a colonoscopy or flexible sigmoidoscopy) you may notice spotting of blood in your stool or on the toilet paper. If you underwent a bowel prep for your procedure, you may not have a normal bowel movement for a few days.  Please Note:  You might notice some irritation and congestion in your nose or some drainage.  This is from the oxygen used during your procedure.  There is no need for concern and it should clear up in a day or so.  SYMPTOMS TO REPORT IMMEDIATELY:  Following lower endoscopy (colonoscopy or flexible sigmoidoscopy):  Excessive amounts of blood in the stool  Significant tenderness or worsening of abdominal pains  Swelling of the abdomen that is new, acute  Fever of 100F or higher  For urgent or emergent issues, a gastroenterologist can be reached at any hour by calling [phone]. Do not use MyChart messaging for urgent concerns.    DIET:  We do recommend a small meal at first, but then you may proceed to your regular diet.  Drink plenty of fluids but you should avoid alcoholic beverages for 24  hours.  ACTIVITY:  You should plan to take it easy for the rest of today and you should NOT DRIVE or use heavy machinery until tomorrow (because of the sedation medicines used during the test).    FOLLOW UP: Our staff will call the number listed on your records the next business day following your procedure.  We will call around 7:15- 8:00 am to check on you and address any questions or concerns that you may have regarding the information given to you following your procedure. If we do not reach you, we will leave a message.     If any biopsies were taken you will be contacted by phone or by letter within the next 1-3 weeks.  Please call us at [phone] if you have not heard about the biopsies in 3 weeks.    SIGNATURES/CONFIDENTIALITY: You and/or your care partner have signed paperwork which will be entered into your electronic medical record.  These signatures attest to the fact that that the information above on your After Visit Summary has been reviewed and is understood.  Full responsibility of the confidentiality of this discharge information lies with you and/or your care-partner.

## 2023-03-22 NOTE — Op Note (Signed)
[facility] Patient Name: Joshua [name] Procedure Date: 03/22/2023 1:44 PM MRN: [id number] Endoscopist: Viviann Spare P. Adela Lank , MD, [id number] Age: 72 Referring MD:  Date of Birth: April 16, 1951 Gender: Male Account #: [id number] Procedure:                Colonoscopy Indications:              High risk colon cancer surveillance: Personal                            history of colonic polyps - 2 sessile serrated                            polyps removed on last exam 10/2017 Medicines:                Monitored Anesthesia Care Procedure:                Pre-Anesthesia Assessment:                           - Prior to the procedure, a History and Physical                            was performed, and patient medications and                            allergies were reviewed. The patient's tolerance of                            previous anesthesia was also reviewed. The risks                            and benefits of the procedure and the sedation                            options and risks were discussed with the patient.                            All questions were answered, and informed consent                            was obtained. Prior Anticoagulants: The patient has                            taken no anticoagulant or antiplatelet agents. ASA                            Grade Assessment: III - A patient with severe                            systemic disease. After reviewing the risks and                            benefits, the patient was deemed in satisfactory  condition to undergo the procedure.                           After obtaining informed consent, the colonoscope                            was passed under direct vision. Throughout the                            procedure, the patient's blood pressure, pulse, and                            oxygen saturations were monitored continuously. The                            CF [id number] #[id number] was  introduced through the anus                            and advanced to the the cecum, identified by                            appendiceal orifice and ileocecal valve. The                            colonoscopy was performed without difficulty. The                            patient tolerated the procedure well. The quality                            of the bowel preparation was good. The ileocecal                            valve, appendiceal orifice, and rectum were                            photographed. Scope In: 2:39:00 PM Scope Out: 2:52:16 PM Scope Withdrawal Time: 0 hours 9 minutes 28 seconds  Total Procedure Duration: 0 hours 13 minutes 16 seconds  Findings:                 The perianal and digital rectal examinations were                            normal.                           Multiple small-mouthed diverticula were found in                            the left colon.                           A single small angiodysplastic lesion was found in  the ascending colon.                           A 5 to 6 mm polyp was found in the transverse                            colon. The polyp was flat. The polyp was removed                            with a cold snare. Resection and retrieval were                            complete.                           A 3 mm polyp was found in the sigmoid colon. The                            polyp was sessile. The polyp was removed with a                            cold snare. Resection and retrieval were complete.                           Internal hemorrhoids were found during retroflexion.                           The exam was otherwise without abnormality. Complications:            No immediate complications. Estimated blood loss:                            Minimal. Estimated Blood Loss:     Estimated blood loss was minimal. Impression:               - Diverticulosis in the left colon.                            - A single colonic angiodysplastic lesion.                           - One 5 to 6 mm polyp in the transverse colon,                            removed with a cold snare. Resected and retrieved.                           - One 3 mm polyp in the sigmoid colon, removed with                            a cold snare. Resected and retrieved.                           - Internal hemorrhoids.                           -  The examination was otherwise normal. Recommendation:           - Patient has a contact number available for                            emergencies. The signs and symptoms of potential                            delayed complications were discussed with the                            patient. Return to normal activities tomorrow.                            Written discharge instructions were provided to the                            patient.                           - Resume previous diet.                           - Continue present medications.                           - Await pathology results. Viviann Spare P. Atreyu Mak, MD 03/22/2023 2:58:19 PM This report has been signed electronically.

## 2023-03-23 ENCOUNTER — Telehealth: Payer: Self-pay

## 2023-03-23 NOTE — Telephone Encounter (Signed)
Left message on answering machine.

## 2023-03-25 LAB — SURGICAL PATHOLOGY

## 2023-03-30 ENCOUNTER — Other Ambulatory Visit: Payer: Self-pay | Admitting: Internal Medicine

## 2023-03-30 MED ORDER — BUDESONIDE-FORMOTEROL FUMARATE 80-4.5 MCG/ACT IN AERO: INHALATION_SPRAY | RESPIRATORY_TRACT | 5 refills | Status: DC

## 2023-04-06 ENCOUNTER — Other Ambulatory Visit: Payer: Self-pay | Admitting: Internal Medicine

## 2023-04-07 ENCOUNTER — Other Ambulatory Visit: Payer: Self-pay | Admitting: Internal Medicine

## 2023-04-07 NOTE — Telephone Encounter (Signed)
You had put this patient this on last ov and this was d/c by other provider.

## 2023-04-08 ENCOUNTER — Telehealth: Payer: Self-pay | Admitting: Gastroenterology

## 2023-04-08 NOTE — Telephone Encounter (Signed)
I have spoken to patient who states that he has been having several weeks of bloating and abdominal discomfort (no pain). He denies any nausea, vomiting, reflux or change in bowel habits. He does take pantoprazole 20 mg daily.   At office visit 09/2021, patient had the same complaints of bloating and was given a 10 day course of flagyl to see if helpful. Patient tells me that he never took the flagyl course because his bloating/discomfort subsided on its own. He does still have the prescription for flagyl on hand. I have discussed low FODMAP diet with him and he states that he is doing this.  Dr Adela Lank,  Please advise.... ok to have him go ahead and take the course of flagyl he has on hand or do you have other recommendations?

## 2023-04-08 NOTE — Telephone Encounter (Signed)
I have spoken with patient to advise that he may begin his previous flagyl prescription if still on hand, or he may try FODZYME supplement. I have placed information on FODZYME in the mail for patient at his request.

## 2023-04-08 NOTE — Telephone Encounter (Signed)
PT is requesting to speak with a nurse about some symptoms he's having. Please advise.

## 2023-04-08 NOTE — Telephone Encounter (Signed)
Okay. Yes if flagyl is not expired okay for him to try it. He could alternatively try some FODZYME supplement which can help with this. Jan has a handout that we can send to him with information if he wants to try that too. Thanks

## 2023-04-10 ENCOUNTER — Other Ambulatory Visit: Payer: Self-pay | Admitting: Family Medicine

## 2023-04-10 DIAGNOSIS — A609 Anogenital herpesviral infection, unspecified: Secondary | ICD-10-CM

## 2023-04-28 ENCOUNTER — Ambulatory Visit (INDEPENDENT_AMBULATORY_CARE_PROVIDER_SITE_OTHER): Payer: Medicare Other | Admitting: Internal Medicine

## 2023-04-28 ENCOUNTER — Encounter: Payer: Self-pay | Admitting: Internal Medicine

## 2023-04-28 VITALS — BP 124/64 | HR 84 | Temp 98.1°F | Ht 68.0 in | Wt 198.8 lb

## 2023-04-28 DIAGNOSIS — J4530 Mild persistent asthma, uncomplicated: Secondary | ICD-10-CM

## 2023-04-28 MED ORDER — FLUTICASONE-SALMETEROL 45-21 MCG/ACT IN AERO: 2.0000 | INHALATION_SPRAY | Freq: Two times a day (BID) | RESPIRATORY_TRACT | 12 refills | Status: DC

## 2023-04-28 NOTE — Patient Instructions (Addendum)
Change wixella to advair  hfa 45/21  Take 2 puffs first thing in am and then another 2 puffs about 12 hours later.   Work on inhaler technique:  relax and gently blow all the way out then take a nice smooth full deep breath back in, triggering the inhaler at same time you start breathing in.  Hold breath in for at least  5 seconds if you can. Blow out advair thru nose. Rinse and gargle with water when done.  If mouth or throat bother you at all,  try brushing teeth/gums/tongue with arm and hammer toothpaste/ make a slurry and gargle and spit out.        Only use your albuterol as a rescue medication to be used if you can't catch your breath by resting or doing a relaxed purse lip breathing pattern.  - The less you use it, the better it will work when you need it. - Ok to use up to 2 puffs  every 4 hours if you must but call for immediate appointment if use goes up over your usual need - Don't leave home without it !!  (think of it like the spare tire for your car)    Also  Ok to try albuterol 15 min before an activity (on alternating days)  that you know would usually make you short of breath and see if it makes any difference and if makes none then don't take albuterol after activity unless you can't catch your breath as this means it's the resting that helps, not the albuterol.  Please schedule a follow up visit in  6  months but call sooner if needed > dulera 100 is the other option

## 2023-04-29 NOTE — Assessment & Plan Note (Signed)
Onset around 2000 in [place] with allergy testing neg there 06/29/2016    reduce symbicort to 80 2 bid  - FENO 07/21/2016  =   24 on symb 80 2bid - Spirometry 07/21/2016  wnl including  Mid flows p am symbicort > try off > flared so restarted  - 09/05/2017    try symb increase to 160  - PFT's  10/03/2017  FEV1 2.57 (88 % ) ratio 67  p 21 % improvement from saba p symb 160 prior to study with DLCO  108 % corrects to 113 % for alv volume  With variable truncation of insp portion of f/v loop and reported better benefit from 80 than 160 strength c/w uacs / vcd component  - 05/31/2018 flare on symb 80 2bid  - 07/18/2018 added singulair trial > d/c 01/23/2019  - 07/18/18 Spirometry   FEV1 2.6 (88%)  Ratio 0.80  - 09/01/2018  After extensive coaching inhaler device,  effectiveness =    75% (short Ti) rec try symb 80 just one bid to see if helps hoarseness or flares asthma > consider adding spacer next  - try off singulair 01/23/2019  - 11/09/2022  After extensive coaching inhaler device,  effectiveness =    80% (short ti) / 90% with wixella device > ok to try wixella but keep dose 100 one bid due to tendency to cough on higher doses of advair - 02/08/2023 changed back to symbicort due to perceived side effects from wixella = palpitations / slt throat irritation   - 04/28/2023 changed wixella to advair 45   04/28/2023  After extensive coaching inhaler device,  effectiveness =    75%   Intol of dpi > try hfa advair starting at 45/21 2bid and if not tolerating then go to a Tier 3 - duler 100 or seek generic symbicort 80 thur [place] or [place] if cannot afford it here.         Each maintenance medication was reviewed in detail including emphasizing most importantly the difference between maintenance and prns and under what circumstances the prns are to be triggered using an action plan format where appropriate.  Total time for H and P, chart review, counseling, reviewing hfa device(s) and generating customized AVS unique to  this office visit / same day charting = 25 min

## 2023-05-17 ENCOUNTER — Encounter: Payer: Self-pay | Admitting: Internal Medicine

## 2023-05-17 ENCOUNTER — Ambulatory Visit (INDEPENDENT_AMBULATORY_CARE_PROVIDER_SITE_OTHER): Payer: Medicare Other | Admitting: Internal Medicine

## 2023-05-17 VITALS — BP 138/84 | HR 79 | Temp 98.1°F | Resp 16 | Ht 66.0 in | Wt 198.2 lb

## 2023-05-17 DIAGNOSIS — J4541 Moderate persistent asthma with (acute) exacerbation: Secondary | ICD-10-CM

## 2023-05-17 MED ORDER — PREDNISONE 10 MG PO TABS: ORAL_TABLET | ORAL | 0 refills | Status: DC

## 2023-05-17 NOTE — Progress Notes (Signed)
Subjective:    Patient ID: Joshua [name], male    DOB: 08/08/1950, 72 y.o.   MRN: [id number]  DOS:  05/17/2023 Type of visit - description: Acute  Reports asthma exacerbation for the last 10 days. Symptoms are cough and wheezing. Mostly during the daytime, not much so during the nighttime. He feels some chest congestion but is unable to bring the mucus up.  Denies any fever or chills. Denies URI symptoms such as runny nose or sore throat. Good compliance with PPIs, reports no recent heartburn.   Review of Systems See above   Past Medical History:  Diagnosis Date   Arthritis    Asthma    BPH (benign prostatic hyperplasia)    GERD (gastroesophageal reflux disease)    HLD (hyperlipidemia)    HSV infection    Hypertension    Kidney stones     Past Surgical History:  Procedure Laterality Date   APPENDECTOMY     BREAST CYST EXCISION Left    benign   LITHOTRIPSY     TONSILLECTOMY      Current Outpatient Medications  Medication Instructions   acyclovir ointment (ZOVIRAX) 5 % 1 Application, Topical, Every  3 hours, Use for one week as needed for outbreak   albuterol (VENTOLIN HFA) 108 (90 Base) MCG/ACT inhaler INHALE 1 TO 2 PUFFS INTO THE LUNGS EVERY 6 HOURS AS NEEDED FOR WHEEZING OR SHORTNESS OF BREATH   alclomethasone (ACLOVATE) 0.05 % cream 1 Application, Topical, 2 times daily   alfuzosin (UROXATRAL) 10 mg, Oral, Daily   atorvastatin (LIPITOR) 10 MG tablet TAKE 1 TABLET BY MOUTH DAILY   Cholecalciferol (D3 2000) 50 MCG (2000 UT) CAPS Oral   diclofenac Sodium (VOLTAREN) 2 g, Topical, 4 times daily, Use as needed for pain   fluticasone-salmeterol (ADVAIR HFA) 45-21 MCG/ACT inhaler 2 puffs, Inhalation, 2 times daily   losartan (COZAAR) 100 mg, Oral, Daily   magnesium 30 mg, Oral, 2 times daily   methocarbamol (ROBAXIN) 500 mg, Oral, Every 8 hours PRN   pantoprazole (PROTONIX) 20 MG tablet Take 30-60 min before first meal of the day   tadalafil (CIALIS) 5 mg, Oral,  Daily, Use daily for BPH   triamcinolone cream (KENALOG) 0.1 % 1 Application, Topical, 2 times daily, Use as needed for dermatitis on leg   valACYclovir (VALTREX) 500 mg, Oral, Daily       Objective:   Physical Exam BP 138/84   Pulse 79   Temp 98.1 F (36.7 C) (Oral)   Resp 16   Ht 5\' 6"  (1.676 m)   Wt 198 lb 4 oz (89.9 kg)   SpO2 98%   BMI 32.00 kg/m  General:   Well developed, NAD, BMI noted. HEENT:  Normocephalic . Face symmetric, atraumatic. Throat: Symmetric, no red Lungs:  Increased expiratory time, few scattered rhonchi Normal respiratory effort, no intercostal retractions, no accessory muscle use. Heart: RRR,  no murmur.  Lower extremities: no pretibial edema bilaterally  Skin: Not pale. Not jaundice Neurologic:  alert & oriented X3.  Speech normal, gait appropriate for age and unassisted Psych--  Cognition and judgment appear intact.  Cooperative with normal attention span and concentration.  Behavior appropriate. No anxious or depressed appearing.      Assessment   72 year old male, history of HTN, high cholesterol, asthma, sees pulmonary.  Asthma exacerbation: Exacerbation started 10 days ago, reports that this is the first exacerbation  in several months, typically has exacerbations in the fall. Plan:  Prednisone, Robitussin DM Continue  Advair and albuterol.  We went over the inhaler technique and also encouraged to rinse his inhalers weekly. Continue PPIs. Call if not gradually better, ABX?

## 2023-05-17 NOTE — Patient Instructions (Addendum)
Take prednisone as prescribed  Get over-the-counter Robitussin DM and take it for chest congestion and cough  Continue Advair 2 puffs twice daily  Continue using albuterol as needed for coughing wheezing  Remember to rinse your inhalers at least weekly  When you use your inhaler: Take a deep breath and hold it for 10 seconds.  Take pantoprazole in the morning before your breakfast on an empty stomach  Call if not gradually better

## 2023-06-27 ENCOUNTER — Telehealth: Payer: Self-pay | Admitting: Internal Medicine

## 2023-06-27 ENCOUNTER — Encounter: Payer: Self-pay | Admitting: Internal Medicine

## 2023-06-27 NOTE — Telephone Encounter (Signed)
Primary Pulmonologist: Wert Last office visit and with whom: 04/28/2023 Wert What do we see them for (pulmonary problems): Asthma Last OV assessment/plan:    Patient Instructions by Nyoka Cowden, MD at 04/28/2023 3:30 PM  Author: Nyoka Cowden, MD Author Type: Physician Filed: 04/28/2023  4:03 PM  Note Status: Addendum Cosign: Cosign Not Required Encounter Date: 04/28/2023  Editor: Nyoka Cowden, MD (Physician)      Prior Versions: 1. Nyoka Cowden, MD (Physician) at 04/28/2023  4:00 PM - Signed  Change Joshua [name] to advair  hfa 45/21  Take 2 puffs first thing in am and then another 2 puffs about 12 hours later.    Work on inhaler technique:  relax and gently blow all the way out then take a nice smooth full deep breath back in, triggering the inhaler at same time you start breathing in.  Hold breath in for at least  5 seconds if you can. Blow out advair thru nose. Rinse and gargle with water when done.  If mouth or throat bother you at all,  try brushing teeth/gums/tongue with arm and hammer toothpaste/ make a slurry and gargle and spit out.         Only use your albuterol as a rescue medication to be used if you can't catch your breath by resting or doing a relaxed purse lip breathing pattern.  - The less you use it, the better it will work when you need it. - Ok to use up to 2 puffs  every 4 hours if you must but call for immediate appointment if use goes up over your usual need - Don't leave home without it !!  (think of it like the spare tire for your car)      Also  Ok to try albuterol 15 min before an activity (on alternating days)  that you know would usually make you short of breath and see if it makes any difference and if makes none then don't take albuterol after activity unless you can't catch your breath as this means it's the resting that helps, not the albuterol.   Please schedule a follow up visit in  6  months but call sooner if needed > dulera 100 is the other  option           Orthostatic Vitals Recorded in This Encounter   04/28/2023 1535     Patient Position: Sitting  BP Location: Left Arm  Cuff Size: Normal   Instructions  Change Joshua [name] to advair  hfa 45/21  Take 2 puffs first thing in am and then another 2 puffs about 12 hours later.    Work on inhaler technique:  relax and gently blow all the way out then take a nice smooth full deep breath back in, triggering the inhaler at same time you start breathing in.  Hold breath in for at least  5 seconds if you can. Blow out advair thru nose. Rinse and gargle with water when done.  If mouth or throat bother you at all,  try brushing teeth/gums/tongue with arm and hammer toothpaste/ make a slurry and gargle and spit out.         Only use your albuterol as a rescue medication to be used if you can't catch your breath by resting or doing a relaxed purse lip breathing pattern.  - The less you use it, the better it will work when you need it. - Ok to use up to 2 puffs  every 4 hours if you must but call for immediate appointment if use goes up over your usual need - Don't leave home without it !!  (think of it like the spare tire for your car)      Also  Ok to try albuterol 15 min before an activity (on alternating days)  that you know would usually make you short of breath and see if it makes any difference and if makes none then don't take albuterol after activity unless you can't catch your breath as this means it's the resting that helps, not the albuterol.   Please schedule a follow up visit in  6  months but call sooner if needed > dulera 100 is the other option       Was appointment offered to patient (explain)?  None available   Reason for call:   Has had a cough for the past few days, mucous is yellow.  Denies any fever, chils or body aches.  Increase sob with cough.  No cough at HS, coughing mostly during the day.  Taking tylenol and OTC Herbal cough syrup.  Using Albuterol  inhaler 1-2 times per day, usually uses every few days.  He uses pantoprazole in the am and Pepcid at HS without much change.  He is wondering if Dr. Sherene Sires recommends a stronger cough medication.  Dr. Sherene Sires, please advise.  Thank you.  (examples of things to ask: : When did symptoms start? Fever? Cough? Productive? Color to sputum? More sputum than usual? Wheezing? Have you needed increased oxygen? Are you taking your respiratory medications? What over the counter measures have you tried?)  No Known Allergies  Immunization History  Administered Date(s) Administered   Fluad Quad(high Dose 65+) 05/11/2019, 05/14/2020, 03/27/2021   Influenza Split 04/01/2017   Influenza, High Dose Seasonal PF 04/01/2017, 03/22/2018   Influenza-Unspecified 05/21/2019   Moderna Sars-Covid-2 Vaccination 08/04/2019, 09/05/2019   Pneumococcal Conjugate-13 12/23/2017   Pneumococcal Polysaccharide-23 05/11/2019   Tdap 01/18/2018

## 2023-06-27 NOTE — Telephone Encounter (Signed)
Dr. Sherene Sires, Please see patient's message regarding Symbicort.  Thank you.

## 2023-06-27 NOTE — Telephone Encounter (Signed)
PT writes on MYCHART:  Cough and wheezing   Advised him to contact his primary care provider. Said we would have a nurse call him.

## 2023-06-27 NOTE — Telephone Encounter (Signed)
Phergan dm  #240 cc 1-2 tsp qid prn  Prednisone 10 mg take  4 each am x 2 days,   2 each am x 2 days,  1 each am x 2 days and stop  Zpak

## 2023-06-27 NOTE — Telephone Encounter (Signed)
Pt writes again:  Thank you. Please ask the nurse to give me a call, as I need to update my asthma medication. Thanks. [employer]

## 2023-06-27 NOTE — Telephone Encounter (Signed)
Symbicort 80 Take 2 puffs first thing in am and then another 2 puffs about 12 hours later.

## 2023-06-28 ENCOUNTER — Ambulatory Visit
Admission: RE | Admit: 2023-06-28 | Discharge: 2023-06-28 | Disposition: A | Discharge status: Home / Self Care | Payer: Medicare Other | Source: Ambulatory Visit | Attending: Family Medicine | Admitting: Family Medicine

## 2023-06-28 VITALS — BP 126/81 | HR 95 | Temp 99.3°F | Resp 15 | Ht 66.0 in | Wt 199.0 lb

## 2023-06-28 DIAGNOSIS — B9789 Other viral agents as the cause of diseases classified elsewhere: Secondary | ICD-10-CM | POA: Diagnosis not present

## 2023-06-28 DIAGNOSIS — J4540 Moderate persistent asthma, uncomplicated: Secondary | ICD-10-CM

## 2023-06-28 DIAGNOSIS — J988 Other specified respiratory disorders: Secondary | ICD-10-CM | POA: Diagnosis not present

## 2023-06-28 MED ORDER — BUDESONIDE-FORMOTEROL FUMARATE 80-4.5 MCG/ACT IN AERO: 2.0000 | INHALATION_SPRAY | Freq: Two times a day (BID) | RESPIRATORY_TRACT | 11 refills | Status: DC

## 2023-06-28 MED ORDER — PROMETHAZINE-DM 6.25-15 MG/5ML PO SYRP: 5.0000 mL | ORAL_SOLUTION | Freq: Three times a day (TID) | ORAL | 0 refills | Status: DC | PRN

## 2023-06-28 MED ORDER — PREDNISONE 20 MG PO TABS: ORAL_TABLET | ORAL | 0 refills | Status: DC

## 2023-06-28 MED ORDER — AZITHROMYCIN 250 MG PO TABS: ORAL_TABLET | ORAL | 0 refills | Status: DC

## 2023-06-28 NOTE — Discharge Instructions (Addendum)
 We will manage this as a viral respiratory infection. For sore throat or cough try using a honey-based tea. Use 3 teaspoons of honey with juice squeezed from half lemon. Place shaved pieces of ginger into 1/2-1 cup of water and warm over stove top. Then mix the ingredients and repeat every 4 hours as needed. Please take Tylenol  500mg -650mg  once every 6 hours for fevers, aches and pains. Hydrate very well with at least 2 liters (64 ounces) of water. Eat light meals such as soups (chicken and noodles, chicken wild rice, vegetable).  Do not eat any foods that you are allergic to.  Start an antihistamine like Zyrtec (10mg  daily) for postnasal drainage, sinus congestion.  You can take this together with prednisone  for your asthma. Use cough syrup as needed.

## 2023-06-28 NOTE — Telephone Encounter (Signed)
Called and spoke with patient, advised of recommendations per Dr. Sherene Sires, he verbalized understanding.  Zpack sent in after verifying pharmacy.  Nothing further needed.

## 2023-06-28 NOTE — ED Provider Notes (Signed)
 [name]  Note:  This document was prepared using Conservation officer, historic buildings and may include unintentional dictation errors.  MRN: [id number] DOB: [birth year]  Subjective:   Joshua [name] is a 72 y.o. male presenting for 5-day history of coughing, wheezing, chest tightness, coughing spells that elicit dizziness.  Symptoms started 5 days ago and thought he turned a corner but symptoms returned overnight.  Has a history of asthma.  Patient is taking Symbicort , just got his refill.  No current facility-administered medications for this encounter.  Current Outpatient Medications:    acyclovir  ointment (ZOVIRAX ) 5 %, Apply 1 Application topically every 3 (three) hours. Use for one week as needed for outbreak, Disp: 30 g, Rfl: 3   albuterol  (VENTOLIN  HFA) 108 (90 Base) MCG/ACT inhaler, INHALE 1 TO 2 PUFFS INTO THE LUNGS EVERY 6 HOURS AS NEEDED FOR WHEEZING OR SHORTNESS OF BREATH, Disp: 8.5 g, Rfl: 2   alclomethasone (ACLOVATE) 0.05 % cream, Apply 1 Application topically 2 (two) times daily., Disp: , Rfl:    alfuzosin  (UROXATRAL ) 10 MG 24 hr tablet, Take 1 tablet (10 mg total) by mouth daily., Disp: 90 tablet, Rfl: 3   atorvastatin  (LIPITOR) 10 MG tablet, TAKE 1 TABLET BY MOUTH DAILY, Disp: 90 tablet, Rfl: 3   Cholecalciferol (D3 2000) 50 MCG (2000 UT) CAPS, Take by mouth., Disp: , Rfl:    diclofenac  Sodium (VOLTAREN ) 1 % GEL, Apply 2 g topically 4 (four) times daily. Use as needed for pain, Disp: 100 g, Rfl: 3   fluticasone -salmeterol (ADVAIR HFA) 45-21 MCG/ACT inhaler, Inhale 2 puffs into the lungs 2 (two) times daily., Disp: 1 each, Rfl: 12   losartan  (COZAAR ) 100 MG tablet, Take 1 tablet (100 mg total) by mouth daily., Disp: 90 tablet, Rfl: 3   magnesium 30 MG tablet, Take 30 mg by mouth 2 (two) times daily., Disp: , Rfl:    methocarbamol  (ROBAXIN ) 500 MG tablet, Take 1 tablet (500 mg total) by mouth every 8 (eight) hours as needed for muscle spasms., Disp: 40  tablet, Rfl: 1   pantoprazole  (PROTONIX ) 20 MG tablet, Take 30-60 min before first meal of the day, Disp: 180 tablet, Rfl: 2   predniSONE  (DELTASONE ) 10 MG tablet, 4 tablets x 2 days, 3 tabs x 2 days, 2 tabs x 2 days, 1 tab x 2 days, Disp: 20 tablet, Rfl: 0   tadalafil  (CIALIS ) 5 MG tablet, Take 1 tablet (5 mg total) by mouth daily. Use daily for BPH, Disp: 90 tablet, Rfl: 3   triamcinolone  cream (KENALOG ) 0.1 %, Apply 1 Application topically 2 (two) times daily. Use as needed for dermatitis on leg, Disp: 30 g, Rfl: 0   valACYclovir  (VALTREX ) 500 MG tablet, TAKE 1 TABLET (500 MG TOTAL) BY MOUTH DAILY., Disp: 90 tablet, Rfl: 0   No Known Allergies  Past Medical History:  Diagnosis Date   Arthritis    Asthma    BPH (benign prostatic hyperplasia)    GERD (gastroesophageal reflux disease)    HLD (hyperlipidemia)    HSV infection    Hypertension    Kidney stones      Past Surgical History:  Procedure Laterality Date   APPENDECTOMY     BREAST CYST EXCISION Left    benign   LITHOTRIPSY     TONSILLECTOMY      Family History  Problem Relation Age of Onset   Kidney Stones Mother    Alzheimer's disease Father    Prostate cancer Maternal Grandfather  Colon cancer Paternal Grandfather 18   Stomach cancer Neg Hx    Esophageal cancer Neg Hx    Pancreatic cancer Neg Hx    Crohn's disease Neg Hx    Rectal cancer Neg Hx     Social History   Tobacco Use   Smoking status: Former    Current packs/day: 0.00    Average packs/day: 0.5 packs/day for 22.0 years (11.0 ttl pk-yrs)    Types: Cigarettes    Start date: 06/29/1971    Quit date: 06/28/1993    Years since quitting: 30.0   Smokeless tobacco: Never  Vaping Use   Vaping status: Never Used  Substance Use Topics   Alcohol use: Yes    Comment: occasionally   Drug use: No    ROS   Objective:   Vitals: BP 126/81 (BP Location: Left Arm)   Pulse 95   Temp 99.3 F (37.4 C) (Oral)   Resp 15   Ht 5' 6 (1.676 m)   Wt 199 lb  (90.3 kg)   SpO2 93%   BMI 32.12 kg/m   Physical Exam Constitutional:      General: He is not in acute distress.    Appearance: Normal appearance. He is well-developed. He is not ill-appearing, toxic-appearing or diaphoretic.  HENT:     Head: Normocephalic and atraumatic.     Right Ear: External ear normal.     Left Ear: External ear normal.     Nose: Nose normal.     Mouth/Throat:     Mouth: Mucous membranes are moist.  Eyes:     General: No scleral icterus.       Right eye: No discharge.        Left eye: No discharge.     Extraocular Movements: Extraocular movements intact.  Cardiovascular:     Rate and Rhythm: Normal rate and regular rhythm.     Heart sounds: Normal heart sounds. No murmur heard.    No friction rub. No gallop.  Pulmonary:     Effort: Pulmonary effort is normal. No respiratory distress.     Breath sounds: No stridor. Wheezing (mild over mid-lower lung fields) present. No rhonchi or rales.  Neurological:     Mental Status: He is alert and oriented to person, place, and time.  Psychiatric:        Mood and Affect: Mood normal.        Behavior: Behavior normal.        Thought Content: Thought content normal.     Assessment and Plan :   PDMP not reviewed this encounter.  1. Viral respiratory infection   2. Moderate persistent asthma without complication    Recommend a prednisone  for his respiratory symptoms in the context of his asthma.  Otherwise suspect viral respiratory infection.  Will defer imaging for now given clear pulmonary sounds in clinic.  Given timeline of illness, COVID testing is useful.  Counseled patient on potential for adverse effects with medications prescribed/recommended today, ER and return-to-clinic precautions discussed, patient verbalized understanding.    Joshua [name], [place] 06/28/23 1724

## 2023-06-28 NOTE — ED Triage Notes (Signed)
 Patient c/o cough, stuffiness, slight dizziness x 4-5 days.  Patient does have a history of asthma.  Patient has taken Tylenol and an OTC herbal cough syrup.

## 2023-07-05 DIAGNOSIS — L821 Other seborrheic keratosis: Secondary | ICD-10-CM | POA: Diagnosis not present

## 2023-07-15 ENCOUNTER — Ambulatory Visit: Payer: Self-pay | Admitting: Family Medicine

## 2023-07-15 ENCOUNTER — Ambulatory Visit (INDEPENDENT_AMBULATORY_CARE_PROVIDER_SITE_OTHER): Payer: Medicare Other | Admitting: Family

## 2023-07-15 VITALS — BP 104/66 | HR 81 | Temp 98.7°F | Resp 16 | Ht 66.0 in | Wt 194.0 lb

## 2023-07-15 DIAGNOSIS — N138 Other obstructive and reflux uropathy: Secondary | ICD-10-CM | POA: Diagnosis not present

## 2023-07-15 DIAGNOSIS — J328 Other chronic sinusitis: Secondary | ICD-10-CM | POA: Diagnosis not present

## 2023-07-15 DIAGNOSIS — N401 Enlarged prostate with lower urinary tract symptoms: Secondary | ICD-10-CM

## 2023-07-15 MED ORDER — AMOXICILLIN-POT CLAVULANATE 875-125 MG PO TABS: 1.0000 | ORAL_TABLET | Freq: Two times a day (BID) | ORAL | 0 refills | Status: DC

## 2023-07-15 NOTE — Telephone Encounter (Signed)
Copied from CRM [id number]. Topic: Clinical - Pink Word Triage >> Jul 15, 2023  9:43 AM Gurney Maxin H wrote: Reason for Triage: Patient states he has a sinus infection with blood in with mucus.   Chief Complaint: nasal congestion Symptoms: thick mucus with specks of blood Frequency: constant x 1 weeks Pertinent Negatives: Patient denies fever, cough Disposition: [] ED /[] Urgent Care (no appt availability in office) / [x] Appointment(In office/virtual)/ []  [facility]/ [] Home Care/ [] Refused Recommended Disposition /[] [facility]/ []  Follow-up with PCP Additional Notes: Patient reports sinus infection x 1 week, now blowing out mucus with specks of blood. Appt scheduled for today.    Reason for Disposition  [1] Sinus congestion (pressure, fullness) AND [2] present > 10 days  Answer Assessment - Initial Assessment Questions 1. LOCATION: "Where does it hurt?"      Headache  2. ONSET: "When did the sinus pain start?"  (e.g., hours, days)      Started a week ago  3. SEVERITY: "How bad is the pain?"   (Scale 1-10; mild, moderate or severe)   - MILD (1-3): doesn't interfere with normal activities    - MODERATE (4-7): interferes with normal activities (e.g., work or school) or awakens from sleep   - SEVERE (8-10): excruciating pain and patient unable to do any normal activities        3/10 pain level  4. RECURRENT SYMPTOM: "Have you ever had sinus problems before?" If Yes, ask: "When was the last time?" and "What happened that time?"      Had something similar a couple of years ago  5. NASAL CONGESTION: "Is the nose blocked?" If Yes, ask: "Can you open it or must you breathe through your mouth?"     Sometimes feels blocked  6. NASAL DISCHARGE: "Do you have discharge from your nose?" If so ask, "What color?"     "Bloody mucus"; specks of blood and small clots occasionally  7. FEVER: "Do you have a fever?" If Yes, ask: "What is it, how was it measured, and when did it  start?"      No fever  8. OTHER SYMPTOMS: "Do you have any other symptoms?" (e.g., sore throat, cough, earache, difficulty breathing)     Stomache Ache  Protocols used: Sinus Pain or Congestion-A-AH

## 2023-07-15 NOTE — Progress Notes (Signed)
Subjective:     Patient ID: Joshua [name], male    DOB: [birth year], 73 y.o.   MRN: [id number]  Chief Complaint  Patient presents with   Nasal Congestion    Complains of nasal congestion with headache. This started about 1 week ago.    HPI  Discussed the use of AI scribe software for clinical note transcription with the patient, who gave verbal consent to proceed.  History of Present Illness    The patient presents with a month-long history of nasal congestion and blood-tinged mucus, which began after a recent upper respiratory infection. He reports a sensation of pressure and pulsation in the forehead, particularly on the left side. The congestion is worse in the evenings and at night, and is temporarily relieved by the use of salt water and fluticasone nasal sprays. The patient also notes that the nasal drainage is yellow in color with some blood tinge at times. He denies fever. He reports hx of sinusitis on previous imaging.         Health Maintenance Due  Topic Date Due   Zoster Vaccines- Shingrix (1 of 2) Never done   COVID-19 Vaccine (3 - Moderna risk series) 10/03/2019   Medicare Annual Wellness (AWV)  04/23/2022    Past Medical History:  Diagnosis Date   Arthritis    Asthma    BPH (benign prostatic hyperplasia)    GERD (gastroesophageal reflux disease)    HLD (hyperlipidemia)    HSV infection    Hypertension    Kidney stones     Past Surgical History:  Procedure Laterality Date   APPENDECTOMY     BREAST CYST EXCISION Left    benign   LITHOTRIPSY     TONSILLECTOMY      Family History  Problem Relation Age of Onset   Kidney Stones Mother    Alzheimer's disease Father    Prostate cancer Maternal Grandfather    Colon cancer Paternal Grandfather 83   Stomach cancer Neg Hx    Esophageal cancer Neg Hx    Pancreatic cancer Neg Hx    Crohn's disease Neg Hx    Rectal cancer Neg Hx     Social History   Socioeconomic History   Marital status: Married     Spouse name: Not on file   Number of children: 2   Years of education: Not on file   Highest education level: Master's degree (e.g., MA, MS, MEng, MEd, MSW, MBA)  Occupational History   Occupation: Engineer, technical sales  Tobacco Use   Smoking status: Former    Current packs/day: 0.00    Average packs/day: 0.5 packs/day for 22.0 years (11.0 ttl pk-yrs)    Types: Cigarettes    Start date: 06/29/1971    Quit date: 06/28/1993    Years since quitting: 30.0   Smokeless tobacco: Never  Vaping Use   Vaping status: Never Used  Substance and Sexual Activity   Alcohol use: Yes    Comment: occasionally   Drug use: No   Sexual activity: Not Currently  Other Topics Concern   Not on file  Social History Narrative   Not on file   Social Drivers of Health   Financial Resource Strain: Low Risk  (07/15/2023)   Overall Financial Resource Strain (CARDIA)    Difficulty of Paying Living Expenses: Not hard at all  Food Insecurity: No Food Insecurity (07/15/2023)   Hunger Vital Sign    Worried About Running Out of Food in the Last Year: Never true  Ran Out of Food in the Last Year: Never true  Transportation Needs: No Transportation Needs (07/15/2023)   PRAPARE - Administrator, Civil Service (Medical): No    Lack of Transportation (Non-Medical): No  Physical Activity: Insufficiently Active (07/15/2023)   Exercise Vital Sign    Days of Exercise per Week: 2 days    Minutes of Exercise per Session: 10 min  Stress: No Stress Concern Present (07/15/2023)   [employer] of Occupational Health - Occupational Stress Questionnaire    Feeling of Stress : Not at all  Social Connections: Moderately Integrated (07/15/2023)   Social Connection and Isolation Panel [NHANES]    Frequency of Communication with Friends and Family: Three times a week    Frequency of Social Gatherings with Friends and Family: Once a week    Attends Religious Services: More than 4 times per year    Active Member of [employer] or  Organizations: No    Attends Engineer, structural: Not on file    Marital Status: Married  Intimate Partner Violence: Unknown (07/14/2022)   Received from [employer], [facility]   HITS    Physically Hurt: Not on file    Insult or Talk Down To: Not on file    Threaten Physical Harm: Not on file    Scream or Curse: Not on file    Outpatient Medications Prior to Visit  Medication Sig Dispense Refill   acyclovir ointment (ZOVIRAX) 5 % Apply 1 Application topically every 3 (three) hours. Use for one week as needed for outbreak 30 g 3   albuterol (VENTOLIN HFA) 108 (90 Base) MCG/ACT inhaler INHALE 1 TO 2 PUFFS INTO THE LUNGS EVERY 6 HOURS AS NEEDED FOR WHEEZING OR SHORTNESS OF BREATH 8.5 g 2   alclomethasone (ACLOVATE) 0.05 % cream Apply 1 Application topically 2 (two) times daily.     alfuzosin (UROXATRAL) 10 MG 24 hr tablet Take 1 tablet (10 mg total) by mouth daily. 90 tablet 3   atorvastatin (LIPITOR) 10 MG tablet TAKE 1 TABLET BY MOUTH DAILY 90 tablet 3   budesonide-formoterol (SYMBICORT) 80-4.5 MCG/ACT inhaler Inhale 2 puffs into the lungs every 12 (twelve) hours. First thing in the am and then 12 hours later. 1 each 11   Cholecalciferol (D3 2000) 50 MCG (2000 UT) CAPS Take by mouth.     diclofenac Sodium (VOLTAREN) 1 % GEL Apply 2 g topically 4 (four) times daily. Use as needed for pain 100 g 3   losartan (COZAAR) 100 MG tablet Take 1 tablet (100 mg total) by mouth daily. 90 tablet 3   magnesium 30 MG tablet Take 30 mg by mouth 2 (two) times daily.     methocarbamol (ROBAXIN) 500 MG tablet Take 1 tablet (500 mg total) by mouth every 8 (eight) hours as needed for muscle spasms. 40 tablet 1   pantoprazole (PROTONIX) 20 MG tablet Take 30-60 min before first meal of the day 180 tablet 2   promethazine-dextromethorphan (PROMETHAZINE-DM) 6.25-15 MG/5ML syrup Take 5 mLs by mouth 3 (three) times daily as needed for cough. 200 mL 0   tadalafil (CIALIS) 5 MG tablet Take 1 tablet (5 mg  total) by mouth daily. Use daily for BPH 90 tablet 3   triamcinolone cream (KENALOG) 0.1 % Apply 1 Application topically 2 (two) times daily. Use as needed for dermatitis on leg 30 g 0   valACYclovir (VALTREX) 500 MG tablet TAKE 1 TABLET (500 MG TOTAL) BY MOUTH DAILY. 90 tablet 0  azithromycin (ZITHROMAX) 250 MG tablet Take 2 tablets on the first day and then 1 tablet on day 2-6. 6 tablet 0   predniSONE (DELTASONE) 20 MG tablet Take 2 tablets daily with breakfast. 10 tablet 0   No facility-administered medications prior to visit.    No Known Allergies  ROS    See HPI  Objective:    Physical Exam Constitutional:      General: He is not in acute distress.    Appearance: He is well-developed.  HENT:     Head: Normocephalic and atraumatic.     Right Ear: Tympanic membrane and ear canal normal.     Left Ear: Tympanic membrane and ear canal normal.     Nose:     Right Sinus: No maxillary sinus tenderness or frontal sinus tenderness.     Left Sinus: No maxillary sinus tenderness or frontal sinus tenderness.  Cardiovascular:     Rate and Rhythm: Normal rate and regular rhythm.     Heart sounds: No murmur heard. Pulmonary:     Effort: Pulmonary effort is normal. No respiratory distress.     Breath sounds: Normal breath sounds. No wheezing or rales.  Skin:    General: Skin is warm and dry.  Neurological:     Mental Status: He is alert and oriented to person, place, and time.  Psychiatric:        Behavior: Behavior normal.        Thought Content: Thought content normal.      BP 104/66 (BP Location: Right Arm, Patient Position: Sitting, Cuff Size: Normal)   Pulse 81   Temp 98.7 F (37.1 C) (Oral)   Resp 16   Ht 5\' 6"  (1.676 m)   Wt 194 lb (88 kg)   SpO2 97%   BMI 31.31 kg/m  Wt Readings from Last 3 Encounters:  07/15/23 194 lb (88 kg)  06/28/23 199 lb (90.3 kg)  05/17/23 198 lb 4 oz (89.9 kg)       Assessment & Plan:   Problem List Items Addressed This Visit        Unprioritized   Sinusitis, chronic/Cough    Persistent nasal congestion, yellow/blood-tinged nasal mucus, and facial pressure for one month. No fever. Physical exam revealed no tenderness over sinuses. Pt has hx of sinusitis. Given duration, treating as bacterial sinusitis. -Prescribe Augmentin 875mg  twice daily for 10 days. -Continue Flonase and nasal saline spray twice daily. -If symptoms worsen or do not improve, patient to notify clinic.      Relevant Medications   amoxicillin-clavulanate (AUGMENTIN) 875-125 MG tablet   BPH with urinary obstruction - Primary   He reports that PCP wants him to have a 6 month follow up PSA and would like to complete today.      Relevant Orders   PSA    I have discontinued Orange Bhullar's predniSONE and azithromycin. I am also having him start on amoxicillin-clavulanate. Additionally, I am having him maintain his alfuzosin, diclofenac Sodium, triamcinolone cream, pantoprazole, atorvastatin, losartan, tadalafil, acyclovir ointment, methocarbamol, alclomethasone, magnesium, D3 2000, albuterol, valACYclovir, promethazine-dextromethorphan, and budesonide-formoterol.  Meds ordered this encounter  Medications   amoxicillin-clavulanate (AUGMENTIN) 875-125 MG tablet    Sig: Take 1 tablet by mouth 2 (two) times daily.    Dispense:  20 tablet    Refill:  0    Supervising Provider:   Danise Edge A [[id number]]

## 2023-07-15 NOTE — Assessment & Plan Note (Signed)
He reports that PCP wants him to have a 6 month follow up PSA and would like to complete today.

## 2023-07-15 NOTE — Patient Instructions (Signed)
VISIT SUMMARY:  You came in today because you have been experiencing nasal congestion and blood-tinged mucus for the past month, along with a sensation of pressure and pulsation in your forehead. These symptoms started after a recent illness. You have been using salt water and fluticasone nasal sprays, which provide temporary relief.  YOUR PLAN:  -SINUSITIS: Sinusitis is an inflammation or swelling of the tissue lining the sinuses, which can cause congestion, mucus, and facial pressure. Given the duration of your symptoms, we are treating it as bacterial sinusitis. You have been prescribed Augmentin 875mg  to take twice daily for 10 days. Please continue using Flonase and nasal saline spray twice daily. If your symptoms worsen or do not improve, please notify the clinic.  -GENERAL HEALTH MAINTENANCE: We have ordered a PSA test as recommended by your previous provider. Please complete this test at the lab today.  INSTRUCTIONS:  Please take Augmentin 875mg  twice daily for 10 days. Continue using Flonase and nasal saline spray twice daily. If your symptoms worsen or do not improve, notify the clinic. Complete the PSA test at the lab today.

## 2023-07-15 NOTE — Assessment & Plan Note (Signed)
  Persistent nasal congestion, yellow/blood-tinged nasal mucus, and facial pressure for one month. No fever. Physical exam revealed no tenderness over sinuses. Pt has hx of sinusitis. Given duration, treating as bacterial sinusitis. -Prescribe Augmentin 875mg  twice daily for 10 days. -Continue Flonase and nasal saline spray twice daily. -If symptoms worsen or do not improve, patient to notify clinic.

## 2023-07-16 ENCOUNTER — Encounter: Payer: Self-pay | Admitting: Family

## 2023-07-16 LAB — PSA: PSA: 1.71 ng/mL (ref <=4.00)

## 2023-07-18 DIAGNOSIS — A609 Anogenital herpesviral infection, unspecified: Secondary | ICD-10-CM | POA: Diagnosis not present

## 2023-07-18 DIAGNOSIS — L810 Postinflammatory hyperpigmentation: Secondary | ICD-10-CM | POA: Diagnosis not present

## 2023-07-18 DIAGNOSIS — L308 Other specified dermatitis: Secondary | ICD-10-CM | POA: Diagnosis not present

## 2023-07-20 ENCOUNTER — Other Ambulatory Visit: Payer: Self-pay | Admitting: Family Medicine

## 2023-07-20 DIAGNOSIS — A609 Anogenital herpesviral infection, unspecified: Secondary | ICD-10-CM

## 2023-07-27 ENCOUNTER — Ambulatory Visit (INDEPENDENT_AMBULATORY_CARE_PROVIDER_SITE_OTHER): Payer: Medicare Other | Admitting: Internal Medicine

## 2023-07-27 ENCOUNTER — Encounter: Payer: Self-pay | Admitting: Internal Medicine

## 2023-07-27 VITALS — BP 142/74 | HR 85 | Temp 97.3°F | Resp 16 | Ht 66.0 in | Wt 194.0 lb

## 2023-07-27 DIAGNOSIS — T781XXA Other adverse food reactions, not elsewhere classified, initial encounter: Secondary | ICD-10-CM | POA: Diagnosis not present

## 2023-07-27 DIAGNOSIS — J4530 Mild persistent asthma, uncomplicated: Secondary | ICD-10-CM | POA: Diagnosis not present

## 2023-07-27 DIAGNOSIS — J310 Chronic rhinitis: Secondary | ICD-10-CM | POA: Diagnosis not present

## 2023-07-27 DIAGNOSIS — I10 Essential (primary) hypertension: Secondary | ICD-10-CM | POA: Diagnosis not present

## 2023-07-27 DIAGNOSIS — R040 Epistaxis: Secondary | ICD-10-CM

## 2023-07-27 DIAGNOSIS — T781XXD Other adverse food reactions, not elsewhere classified, subsequent encounter: Secondary | ICD-10-CM | POA: Diagnosis not present

## 2023-07-27 MED ORDER — IPRATROPIUM BROMIDE 0.06 % NA SOLN: 2.0000 | Freq: Four times a day (QID) | NASAL | 12 refills | Status: DC

## 2023-07-27 NOTE — Patient Instructions (Signed)
Suspected Food Allergy Reported reaction to [place] stew with multiple ingredients, including spices, preserved lemons, and dried figs. Symptoms included facial and ear redness, lip swelling, and high blood pressure. No gastrointestinal symptoms. No similar reactions with individual ingredients since the incident. -Order blood work for individual spices, lemons, figs, and other ingredients. - Avoid all ingredients until labs results  - Will hold off on epipen until testing is resulted   Chronic Asthma Reports of seasonal exacerbations with cough, typically end of October through December. No nocturnal symptoms. Currently managed with Symbicort. -Continue Symbicort 2 puffs twice daily as prescribed by pulmonary    Nasal Symptoms Reports of postnasal drip and nosebleeds, particularly in the evening. Previous ENT evaluation unremarkable. Symptoms may be contributing to asthma exacerbations. -Add nasal Atrovent up to four times daily as needed for drainage. -Continue sinus wash as tolerated.  Gastroesophageal Reflux Disease Controlled with pantoprazole. -Continue pantoprazole as prescribed.  Follow-up Await lab results for food allergy panel. Discuss results and next steps once all results are available.   Thank you so much for letting me partake in your care today.  Don't hesitate to reach out if you have any additional concerns!  Ferol Luz, MD  [facility] [place], [place]

## 2023-07-27 NOTE — Progress Notes (Signed)
NEW PATIENT Date of Service/Encounter:  07/27/23 Referring provider: Pearline Cables, MD Primary care provider: Pearline Cables, MD  Subjective:  Joshua [name] is a 73 y.o. male  presenting today for evaluation of allergic reaction  History obtained from: chart review and patient.   Discussed the use of AI scribe software for clinical note transcription with the patient, who gave verbal consent to proceed.  History of Present Illness   The patient, with asthma, presents with concerns of a possible spice allergy.  Approximately three weeks ago, they experienced an allergic reaction after consuming a homemade [place] stew containing spices such as cinnamon, saffron, ginger powder, and allspice, along with preserved lemons, olives, and figs. They had previously consumed all ingredients separately except for the preserved lemons and figs. Shortly after eating, they experienced facial redness, ear redness, and lip swelling. They noted slightly elevated blood pressure and took Claritin before bed. The following morning, they still felt lip swelling, although it was not visibly apparent. No gastrointestinal symptoms like nausea, vomiting, or diarrhea were present, but they did have epistaxis. No visible lip swelling, redness of the eyes, nausea, vomiting, diarrhea, or asthma flare-ups occurred during the allergic reaction.  They have a history of asthma and report a persistent cough lasting about three weeks, attributed to asthma. This cough occurs seasonally, typically from the end of October to December, and is accompanied by nasal mucus and epistaxis, particularly in the evening. They have not experienced these symptoms during the day. They use Symbicort for asthma management and have tried other medications like Wixela and Advair in the past, but found them less effective. The cough does  respond to their rescue inhaler.  He has not had allergy testing. . They report a persistent cough that  does not occur at night, allowing them to sleep without disturbance.  They experience recurrent epistaxis, which have been evaluated by an ENT in the past without a definitive cause. The epistaxis sometimes occur after consuming wine, possibly due to sulfites, but this is not consistent. They also report postnasal drip and have been using a sinus wash with salt to manage mucus and blood, but symptoms recur.        Other allergy screening: Asthma:  Yes managed by pulmonary, 12-pack-year cigarette history stopped in 1992 Rhino conjunctivitis: yes Food allergy:  reaction as above Medication allergy: no Hymenoptera allergy: no Urticaria: no Eczema:no History of recurrent infections suggestive of immunodeficency: no Vaccinations are up to date.   Past Medical History: Past Medical History:  Diagnosis Date   Arthritis    Asthma    BPH (benign prostatic hyperplasia)    GERD (gastroesophageal reflux disease)    HLD (hyperlipidemia)    HSV infection    Hypertension    Kidney stones    Medication List:  Current Outpatient Medications  Medication Sig Dispense Refill   acyclovir ointment (ZOVIRAX) 5 % Apply 1 Application topically every 3 (three) hours. Use for one week as needed for outbreak 30 g 3   albuterol (VENTOLIN HFA) 108 (90 Base) MCG/ACT inhaler INHALE 1 TO 2 PUFFS INTO THE LUNGS EVERY 6 HOURS AS NEEDED FOR WHEEZING OR SHORTNESS OF BREATH 8.5 g 2   alclomethasone (ACLOVATE) 0.05 % cream Apply 1 Application topically 2 (two) times daily.     alfuzosin (UROXATRAL) 10 MG 24 hr tablet Take 1 tablet (10 mg total) by mouth daily. 90 tablet 3   atorvastatin (LIPITOR) 10 MG tablet TAKE 1 TABLET BY MOUTH DAILY  90 tablet 3   budesonide-formoterol (SYMBICORT) 80-4.5 MCG/ACT inhaler Inhale 2 puffs into the lungs every 12 (twelve) hours. First thing in the am and then 12 hours later. 1 each 11   Cholecalciferol (D3 2000) 50 MCG (2000 UT) CAPS Take by mouth.     losartan (COZAAR) 100 MG  tablet Take 1 tablet (100 mg total) by mouth daily. 90 tablet 3   magnesium 30 MG tablet Take 30 mg by mouth 2 (two) times daily.     pantoprazole (PROTONIX) 20 MG tablet Take 30-60 min before first meal of the day 180 tablet 2   tadalafil (CIALIS) 5 MG tablet Take 1 tablet (5 mg total) by mouth daily. Use daily for BPH 90 tablet 3   triamcinolone cream (KENALOG) 0.1 % Apply 1 Application topically 2 (two) times daily. Use as needed for dermatitis on leg 30 g 0   valACYclovir (VALTREX) 500 MG tablet Take 1 tablet (500 mg total) by mouth daily. 90 tablet 0   No current facility-administered medications for this visit.   Known Allergies:  No Known Allergies Past Surgical History: Past Surgical History:  Procedure Laterality Date   APPENDECTOMY     BREAST CYST EXCISION Left    benign   LITHOTRIPSY     TONSILLECTOMY     Family History: Family History  Problem Relation Age of Onset   Kidney Stones Mother    Alzheimer's disease Father    Prostate cancer Maternal Grandfather    Colon cancer Paternal Grandfather 76   Stomach cancer Neg Hx    Esophageal cancer Neg Hx    Pancreatic cancer Neg Hx    Crohn's disease Neg Hx    Rectal cancer Neg Hx    Social History: Joshua [name] lives single-family home is 73 years old, no water damage.  Wood floors in family room carpets in bedroom.  Gas heating, central cooling.  No pets.  No roaches in the house and best to be off floor.  No dust mite precautions.  Works as a Advertising copywriter work..   ROS:  All other systems negative except as noted per HPI.  Objective:  Blood pressure (!) 142/74, pulse 85, temperature (!) 97.3 F (36.3 C), temperature source Temporal, resp. rate 16, height 5\' 6"  (1.676 m), weight 194 lb (88 kg), SpO2 98%. Body mass index is 31.31 kg/m. Physical Exam:  General Appearance:  Alert, cooperative, no distress, appears stated age  Head:  Normocephalic, without obvious abnormality, atraumatic  Eyes:   Conjunctiva clear, EOM's intact  Ears EACs normal bilaterally and normal TMs bilaterally  Nose: Nares normal, normal mucosa, no visible anterior polyps, and septum midline  Throat: Lips, tongue normal; teeth and gums normal, normal posterior oropharynx  Neck: Supple, symmetrical  Lungs:   clear to auscultation bilaterally, Respirations unlabored, no coughing  Heart:  regular rate and rhythm and no murmur, Appears well perfused  Extremities: No edema  Skin: Skin color, texture, turgor normal and no rashes or lesions on visualized portions of skin  Neurologic: No gross deficits   Diagnostics: None done   Labs:  Lab Orders         Allergen,Grn Pepper,Paprika,f218         Allergen, Cinnamon Rf220         Carrot IgE         Chicken IgE         Allergen, Ginger, Rf270         Allergen, Lemon, f208  F328-IgE Fig         F342-IgE Olive, Black         Allergens, Zone 2       Assessment and Plan  Assessment and Plan    Suspected Food Allergy Reported reaction to [place] stew with multiple ingredients, including spices, preserved lemons, and dried figs. Symptoms included facial and ear redness, lip swelling, and high blood pressure. No gastrointestinal symptoms. No similar reactions with individual ingredients since the incident. -Order blood work for individual spices, lemons, figs, and other ingredients. - Avoid all ingredients until labs results  - Will hold off on epipen until testing is resulted   Chronic Asthma Reports of seasonal exacerbations with cough, typically end of October through December. No nocturnal symptoms. Currently managed with Symbicort. -Continue Symbicort 2 puffs twice daily as prescribed by pulmonary   Nasal Symptoms Reports of postnasal drip and nosebleeds, particularly in the evening. Previous ENT evaluation unremarkable. Symptoms may be contributing to asthma exacerbations. -Add nasal Atrovent up to four times daily as needed for  drainage. -Continue sinus wash as tolerated.  Gastroesophageal Reflux Disease Controlled with pantoprazole. -Continue pantoprazole as prescribed.  Follow-up Await lab results for food allergy panel. Discuss results and next steps once all results are available.    This note in its entirety was forwarded to the Provider who requested this consultation.  Other:     Thank you for your kind referral. I appreciate the opportunity to take part in Najir's care. Please do not hesitate to contact me with questions.  Sincerely,  Thank you so much for letting me partake in your care today.  Don't hesitate to reach out if you have any additional concerns!  Ferol Luz, MD  [facility] [place], [place]

## 2023-07-28 ENCOUNTER — Ambulatory Visit: Payer: Medicare Other | Admitting: Internal Medicine

## 2023-07-31 LAB — ALLERGEN, CINNAMON, RF220: Allergen Cinnamon IgE: <0.1 kU/L

## 2023-07-31 LAB — ALLERGENS, ZONE 2

## 2023-07-31 LAB — ALLERGEN, CHICKEN F83: Chicken IgE: <0.1 kU/L

## 2023-07-31 LAB — ALLERGEN, LEMON, F208: Lemon: <0.1 kU/L

## 2023-07-31 LAB — ALLERGEN CARROT: Allergen Carrot IgE: <0.1 kU/L

## 2023-07-31 LAB — F342-IGE OLIVE, BLACK: F342-IgE Olive, Black: <0.1 kU/L

## 2023-07-31 LAB — F328-IGE FIG: F328-IgE Fig: <0.1 kU/L

## 2023-07-31 LAB — ALLERGEN,GRN PEPPER,PAPRIKA,F218: Paprika IgE: <0.1 kU/L

## 2023-07-31 LAB — ALLERGEN, GINGER, RF270: Allergen Ginger IgE: <0.1 kU/L

## 2023-08-01 NOTE — Progress Notes (Signed)
Blood work negative for all foods and environmental.  At this point given that you have tolerated the individual foods since the reaction I do not think there is any strict avoidance necessary.  Would recommend avoiding the [place] Stew which you ate prior to symptoms.

## 2023-08-04 DIAGNOSIS — J310 Chronic rhinitis: Secondary | ICD-10-CM | POA: Diagnosis not present

## 2023-08-04 DIAGNOSIS — R040 Epistaxis: Secondary | ICD-10-CM | POA: Diagnosis not present

## 2023-08-18 ENCOUNTER — Ambulatory Visit: Payer: Medicare Other | Admitting: Internal Medicine

## 2023-08-18 ENCOUNTER — Telehealth: Payer: Medicare Other | Admitting: Internal Medicine

## 2023-08-18 DIAGNOSIS — J4530 Mild persistent asthma, uncomplicated: Secondary | ICD-10-CM | POA: Diagnosis not present

## 2023-08-18 DIAGNOSIS — R058 Other specified cough: Secondary | ICD-10-CM | POA: Diagnosis not present

## 2023-08-18 NOTE — Patient Instructions (Addendum)
Add pepcid (otc) 20 mg before bedtime / bed blocks 6-8 in   Be sure to keep up your follow up with ENT    Please schedule a follow up visit in  6  months but call sooner if needed

## 2023-08-18 NOTE — Progress Notes (Unsigned)
Subjective:    Patient ID: Joshua [name], male   DOB: [birth year]    MRN: [id number]    Brief patient profile:  73 yo [place] male  with bad bronchitis onset  around 1993 and quit smoking and then  all better until around 2000 while living in [place] with intermittent "wheezing" dx as asthma > never resolved  Self referred for dtc asthma  Allergy testing in [place] around age 51 = neg per pt    History of Present Illness  06/29/2016 1st  Pulmonary office visit/ Joshua [name]   Chief Complaint  Patient presents with   Pulmonary Consult    moved from [place], chronic bronchitis, wheezing, dx with asthma in 2000, cold humidity makes it worse  maint on symb 160 2bid and at baseline intermittently noisy breathing one breath q 2-3 days  s pattern though not waking him  noct   Worse since  Jun 22 2016  with uri/ sinus symptoms >  [facility] rx  Amox/only a little better, still severe nasal congestion/severe fits of day > noct cough and subj wheeze.  Typical triggers are uri/not seasonal pattern rhintiis - happens up to 4 x yearly with or without symbicort and just as severe. rec Augmentin 875 mg take one pill twice daily  X 10 days   Please see patient coordinator before you leave today  to schedule CT sinus  in 10 days - no sooner Use nasal saline as much as possible to help moisturize your passages  Continue protonix but take it Take 30-60 min before first meal of the day  Prednisone 10 mg take  4 each am x 2 days,   2 each am x 2 days,  1 each am x 2 days and stop  Work on inhaler technique:  Plan A = Automatic = symbicort 80 Take 2 puffs first thing in am and then another 2 puffs about 12 hours later.  Plan B = Backup Only use your albuterol (Proir) as a rescue medication to be used if you can't catch your breath by resting or doing a relaxed purse lip breathing pattern.  - The less you use it, the better it will work when you need it. - Ok to use the inhaler up to 2 puffs  every 4 hours      07/21/2016  f/u ov/Joshua [name] re: uacs vs asthma Chief Complaint  Patient presents with   Follow-up    Cough is much improved, but has not completely resolved. Breathing has improved.   rec Use lots of saline nasal spray on your nose Please remember to go to the x-ray department downstairs for your tests - we will call you with the results when they are available. Ty off the symbicort to see if any worse and if so restart  At 2 pffs every 12 hour schedule ENT  >   [name] rec abx and f/u prn     11/17/2021  f/u ov/Joshua [name] re: uacs vs cough variant asthma maint on symbicort 80 2bid   Chief Complaint  Patient presents with   Follow-up    No complaints.  Rec Plan A = Automatic = Always=    symbicort 80 Take 2 puffs first thing in am and then another 2 puffs about 12 hours later.  Work on inhaler technique:   Plan B = Backup (to supplement plan A, not to replace it) Only use your albuterol inhaler as a rescue medication      02/08/2023  f/u  ov/Joshua [name] re: asthma   maint on  wixella 100/50   Chief Complaint  Patient presents with   Follow-up    Side effect from  meds   Dyspnea:  walking 30 m s stopping sev times a weeks s sob or palpitions "just tired"  Cough: none  Sleeping: level bed with 2 pillows occ waking up with sob and palp sometimes better p saba, sometimes better s saba  SABA use: rarely dayime  02 none Rec Plan A = Automatic = Always=  Symbicort 80 Take 2 puffs first thing in am and then another 2 puffs about 12 hours later.  Plan B = Backup (to supplement plan A, not to replace it) Only use your albuterol inhaler as a rescue medication     Please schedule a follow up visit in 3 months but call sooner if needed - bring inhalers   04/28/2023  f/u ov/Joshua [name] re: asthma    maint on wixella   did not  bring inhalers  Chief Complaint  Patient presents with   Follow-up    Insurance not paying for Symbicort.  Changed to [facility].  Causing throat dryness and hoarseness.  Dyspnea:   still walking up to 40 min including some hills  Cough: globus sensation /hoarseness  Sleeping: bed is flat/ 2 pillows once a week needs saba  SABA use: once a week  02: none   Rec Change wixella to advair  hfa 45/21  Take 2 puffs first thing in am and then another 2 puffs about 12 hours later.  Work on inhaler technique: Only use your albuterol as a rescue medication Also  Ok to try albuterol 15 min before an activity (on alternating days)  that you know would usually make you short of breath  Please schedule a follow up visit in  6  months but call sooner if needed > dulera 100 is the other option      Virtual Visit via Telephone Note 08/18/2023   I connected with Joshua [name] on 08/18/23 at  1:45 PM EST by telephone and verified that I am speaking with the correct person using two identifiers. Pt is at home and this call made from my office with no other participants    I discussed the limitations, risks, security and privacy concerns of performing an evaluation and management service by telephone and the availability of in person appointments. I also discussed with the patient that there may be a patient responsible charge related to this service. The patient expressed understanding and agreed to proceed.   History of Present Illness: Now on symbicort  80  Dyspnea:  40 min at a time Cough: sense of pnds  Sleeping: flat bed 2 pillows wakes up maybe  once or twice  week  SABA use: alb once a week  02: none    No obvious day to day or daytime variability or assoc excess/ purulent sputum or mucus plugs or hemoptysis or cp or chest tightness, subjective wheeze or overt sinus or hb symptoms.    Also denies any obvious fluctuation of symptoms with weather or environmental changes or other aggravating or alleviating factors except as outlined above.   Meds reviewed/ med reconciliation completed     Current Meds  Medication Sig   acyclovir ointment (ZOVIRAX) 5 % Apply 1 Application  topically every 3 (three) hours. Use for one week as needed for outbreak   albuterol (VENTOLIN HFA) 108 (90 Base) MCG/ACT inhaler INHALE 1 TO 2 PUFFS INTO THE  LUNGS EVERY 6 HOURS AS NEEDED FOR WHEEZING OR SHORTNESS OF BREATH   alclomethasone (ACLOVATE) 0.05 % cream Apply 1 Application topically 2 (two) times daily.   alfuzosin (UROXATRAL) 10 MG 24 hr tablet Take 1 tablet (10 mg total) by mouth daily.   atorvastatin (LIPITOR) 10 MG tablet TAKE 1 TABLET BY MOUTH DAILY   budesonide-formoterol (SYMBICORT) 80-4.5 MCG/ACT inhaler Inhale 2 puffs into the lungs every 12 (twelve) hours. First thing in the am and then 12 hours later.   Cholecalciferol (D3 2000) 50 MCG (2000 UT) CAPS Take by mouth.   ipratropium (ATROVENT) 0.06 % nasal spray Place 2 sprays into both nostrils 4 (four) times daily.   losartan (COZAAR) 100 MG tablet Take 1 tablet (100 mg total) by mouth daily.   magnesium 30 MG tablet Take 30 mg by mouth 2 (two) times daily.   mupirocin ointment (BACTROBAN) 2 % Apply topically. Apply topically 3 (three) times a day. Apply a small amount to affected area in the nose twice daily for 14 days.   pantoprazole (PROTONIX) 20 MG tablet Take 30-60 min before first meal of the day   tadalafil (CIALIS) 5 MG tablet Take 1 tablet (5 mg total) by mouth daily. Use daily for BPH   triamcinolone cream (KENALOG) 0.1 % Apply 1 Application topically 2 (two) times daily. Use as needed for dermatitis on leg   valACYclovir (VALTREX) 500 MG tablet Take 1 tablet (500 mg total) by mouth daily.         Observations/Objective: Looks good, no conversational sob/ good voice texture, no spont cough   Assessment and Plan: See problem list for active a/p's   Follow Up Instructions: See avs for instructions unique to this ov which includes revised/ updated med list     I discussed the assessment and treatment plan with the patient. The patient was provided an opportunity to ask questions and all were answered. The  patient agreed with the plan and demonstrated an understanding of the instructions.   The patient was advised to call back or seek an in-person evaluation if the symptoms worsen or if the condition fails to improve as anticipated.  I provided 22 minutes of non-face-to-face time during this encounter.   Sandrea Hughs, MD

## 2023-08-19 ENCOUNTER — Encounter: Payer: Self-pay | Admitting: Internal Medicine

## 2023-08-19 NOTE — Assessment & Plan Note (Signed)
Onset 05/2016  - sinus CT p 10 day augmentin  06/29/2016 >>>  CT  Sinus 07/09/2016 mild chronic changes only  - cough better 07/21/2016 > try just on gerd rx/ no more symbicort > flared off symbicort  eval by [place] ent 07/26/16  Nordbladh, PA > rec levaquin  500 x  10 days and f/u prn  - severe flare 07/18/2018 p uri/? Sinusitis > augmentin /cyclical cough rx> 09/01/2018 improved x for hoarseness and cough with voice use  - severe flare 06/25/20 in setting of covid 19 infection  > cyclical cough rx 06/25/20 > improved 07/28/2020  - 08/18/2023 add back pecpid 20 mg q hs for noct sob requiring saba

## 2023-09-29 DIAGNOSIS — H43393 Other vitreous opacities, bilateral: Secondary | ICD-10-CM | POA: Diagnosis not present

## 2023-09-29 DIAGNOSIS — H524 Presbyopia: Secondary | ICD-10-CM | POA: Diagnosis not present

## 2023-09-29 DIAGNOSIS — H11153 Pinguecula, bilateral: Secondary | ICD-10-CM | POA: Diagnosis not present

## 2023-09-29 DIAGNOSIS — H2513 Age-related nuclear cataract, bilateral: Secondary | ICD-10-CM | POA: Diagnosis not present

## 2023-09-29 DIAGNOSIS — H5203 Hypermetropia, bilateral: Secondary | ICD-10-CM | POA: Diagnosis not present

## 2023-10-22 ENCOUNTER — Other Ambulatory Visit: Payer: Self-pay | Admitting: Family Medicine

## 2023-10-22 DIAGNOSIS — A609 Anogenital herpesviral infection, unspecified: Secondary | ICD-10-CM

## 2023-11-05 NOTE — Patient Instructions (Incomplete)
 It was good to see you again today- I will be in touch with your labs and your x-rays Recommend shingles vaccine series- Shingrix x 2 doses- at your pharmacy if not done yet

## 2023-11-05 NOTE — Progress Notes (Unsigned)
  [facility] [address], [address] [place], [place] [address] [phone]  Date:  11/09/2023   Name:  Joshua [name]   DOB:  1951-01-21   MRN:  [id number]  PCP:  Kaylee Partridge, MD    Chief Complaint: No chief complaint on file.   History of Present Illness:  Joshua [name] is a 73 y.o. very pleasant male patient who presents with the following:  Patient seen today for annual visit Most recent visit with myself was in July History of hypertension, mild asthma, BPH, dyslipidemia, cataracts   His pulmonologist is Dr. Waymond Hailey Urologist is Dr.Budzyn with [facility]  He has ophthalmology follow-up Seen recently by allergy and asthma specialist Follow-up with pulmonology in the fall He also had a colonoscopy last September  Shingles series Recommend COVID booster Most recent labs on chart from June, can update today  Albuterol  as needed Symbicort  Uroxatral  10 mg daily Lipitor Losartan  100 Protonix  Cialis   He saw my partners in January, noted that his PSA while still in normal range had climbed, will repeat today Lab Results  Component Value Date   PSA 1.71 07/15/2023   PSA 1.32 12/13/2022   PSA 0.97 07/16/2021    Lab Results  Component Value Date   HGBA1C 5.4 12/13/2022      Patient Active Problem List   Diagnosis Date Noted   Vitreous floater, bilateral 05/15/2019   Nuclear sclerotic cataract of both eyes 05/15/2019   Precordial chest pain 04/06/2018   Dyspnea on exertion 04/06/2018   Dyslipidemia 04/06/2018   BPH with urinary obstruction 03/31/2018   Essential hypertension 01/17/2018   Solitary lung nodule 09/07/2017   Sinusitis, chronic/Cough 07/22/2016   Chronic asthma, mild persistent, uncomplicated 06/30/2016   Upper airway cough syndrome 06/29/2016    Past Medical History:  Diagnosis Date   Arthritis    Asthma    BPH (benign prostatic hyperplasia)    GERD (gastroesophageal reflux disease)     HLD (hyperlipidemia)    HSV infection    Hypertension    Kidney stones     Past Surgical History:  Procedure Laterality Date   APPENDECTOMY     BREAST CYST EXCISION Left    benign   LITHOTRIPSY     TONSILLECTOMY      Social History   Tobacco Use   Smoking status: Former    Current packs/day: 0.00    Average packs/day: 0.5 packs/day for 22.0 years (11.0 ttl pk-yrs)    Types: Cigarettes    Start date: 06/29/1971    Quit date: 06/28/1993    Years since quitting: 30.3   Smokeless tobacco: Never  Vaping Use   Vaping status: Never Used  Substance Use Topics   Alcohol use: Yes    Comment: occasionally   Drug use: No    Family History  Problem Relation Age of Onset   Kidney Stones Mother    Alzheimer's disease Father    Prostate cancer Maternal Grandfather    Colon cancer Paternal Grandfather 57   Stomach cancer Neg Hx    Esophageal cancer Neg Hx    Pancreatic cancer Neg Hx    Crohn's disease Neg Hx    Rectal cancer Neg Hx     No Known Allergies  Medication list has been reviewed and updated.  Current Outpatient Medications on File Prior to Visit  Medication Sig Dispense Refill   acyclovir  ointment (ZOVIRAX ) 5 % Apply 1 Application topically every 3 (  three) hours. Use for one week as needed for outbreak 30 g 3   albuterol  (VENTOLIN  HFA) 108 (90 Base) MCG/ACT inhaler INHALE 1 TO 2 PUFFS INTO THE LUNGS EVERY 6 HOURS AS NEEDED FOR WHEEZING OR SHORTNESS OF BREATH 8.5 g 2   alclomethasone (ACLOVATE) 0.05 % cream Apply 1 Application topically 2 (two) times daily.     alfuzosin  (UROXATRAL ) 10 MG 24 hr tablet Take 1 tablet (10 mg total) by mouth daily. 90 tablet 3   atorvastatin  (LIPITOR) 10 MG tablet TAKE 1 TABLET BY MOUTH DAILY 90 tablet 3   budesonide -formoterol  (SYMBICORT ) 80-4.5 MCG/ACT inhaler Inhale 2 puffs into the lungs every 12 (twelve) hours. First thing in the am and then 12 hours later. 1 each 11   Cholecalciferol (D3 2000) 50 MCG (2000 UT) CAPS Take by mouth.      ipratropium (ATROVENT ) 0.06 % nasal spray Place 2 sprays into both nostrils 4 (four) times daily. 15 mL 12   losartan  (COZAAR ) 100 MG tablet Take 1 tablet (100 mg total) by mouth daily. 90 tablet 3   magnesium 30 MG tablet Take 30 mg by mouth 2 (two) times daily.     mupirocin ointment (BACTROBAN) 2 % Apply topically. Apply topically 3 (three) times a day. Apply a small amount to affected area in the nose twice daily for 14 days.     pantoprazole  (PROTONIX ) 20 MG tablet Take 30-60 min before first meal of the day 180 tablet 2   tadalafil  (CIALIS ) 5 MG tablet Take 1 tablet (5 mg total) by mouth daily. Use daily for BPH 90 tablet 3   triamcinolone  cream (KENALOG ) 0.1 % Apply 1 Application topically 2 (two) times daily. Use as needed for dermatitis on leg 30 g 0   valACYclovir  (VALTREX ) 500 MG tablet Take 1 tablet (500 mg total) by mouth daily. 90 tablet 1   No current facility-administered medications on file prior to visit.    Review of Systems:  As per HPI- otherwise negative.   Physical Examination: There were no vitals filed for this visit. There were no vitals filed for this visit. There is no height or weight on file to calculate BMI. Ideal Body Weight:    GEN: no acute distress. HEENT: Atraumatic, Normocephalic.  Ears and Nose: No external deformity. CV: RRR, No M/G/R. No JVD. No thrill. No extra heart sounds. PULM: CTA B, no wheezes, crackles, rhonchi. No retractions. No resp. distress. No accessory muscle use. ABD: S, NT, ND, +BS. No rebound. No HSM. EXTR: No c/c/e PSYCH: Normally interactive. Conversant.    Assessment and Plan: ***  Signed Gates Kasal, MD

## 2023-11-09 ENCOUNTER — Encounter: Payer: Self-pay | Admitting: Family Medicine

## 2023-11-09 ENCOUNTER — Ambulatory Visit (INDEPENDENT_AMBULATORY_CARE_PROVIDER_SITE_OTHER): Admitting: Family Medicine

## 2023-11-09 ENCOUNTER — Ambulatory Visit (HOSPITAL_BASED_OUTPATIENT_CLINIC_OR_DEPARTMENT_OTHER)
Admission: RE | Admit: 2023-11-09 | Discharge: 2023-11-09 | Disposition: A | Discharge status: Home / Self Care | Source: Ambulatory Visit | Attending: Family Medicine | Admitting: Family Medicine

## 2023-11-09 VITALS — BP 135/80 | HR 71 | Temp 98.1°F | Ht 66.0 in | Wt 197.4 lb

## 2023-11-09 DIAGNOSIS — J4541 Moderate persistent asthma with (acute) exacerbation: Secondary | ICD-10-CM | POA: Diagnosis not present

## 2023-11-09 DIAGNOSIS — M25552 Pain in left hip: Secondary | ICD-10-CM

## 2023-11-09 DIAGNOSIS — R3914 Feeling of incomplete bladder emptying: Secondary | ICD-10-CM | POA: Diagnosis not present

## 2023-11-09 DIAGNOSIS — E785 Hyperlipidemia, unspecified: Secondary | ICD-10-CM | POA: Diagnosis not present

## 2023-11-09 DIAGNOSIS — M5432 Sciatica, left side: Secondary | ICD-10-CM

## 2023-11-09 DIAGNOSIS — R7309 Other abnormal glucose: Secondary | ICD-10-CM

## 2023-11-09 DIAGNOSIS — M438X6 Other specified deforming dorsopathies, lumbar region: Secondary | ICD-10-CM | POA: Diagnosis not present

## 2023-11-09 DIAGNOSIS — N401 Enlarged prostate with lower urinary tract symptoms: Secondary | ICD-10-CM | POA: Diagnosis not present

## 2023-11-09 DIAGNOSIS — I10 Essential (primary) hypertension: Secondary | ICD-10-CM | POA: Diagnosis not present

## 2023-11-09 DIAGNOSIS — N138 Other obstructive and reflux uropathy: Secondary | ICD-10-CM

## 2023-11-09 DIAGNOSIS — M47816 Spondylosis without myelopathy or radiculopathy, lumbar region: Secondary | ICD-10-CM | POA: Diagnosis not present

## 2023-11-09 DIAGNOSIS — M5126 Other intervertebral disc displacement, lumbar region: Secondary | ICD-10-CM | POA: Diagnosis not present

## 2023-11-09 MED ORDER — LOSARTAN POTASSIUM 100 MG PO TABS: 100.0000 mg | ORAL_TABLET | Freq: Every day | ORAL | 3 refills | Status: AC

## 2023-11-09 MED ORDER — ATORVASTATIN CALCIUM 10 MG PO TABS: ORAL_TABLET | ORAL | 3 refills | Status: AC

## 2023-11-09 MED ORDER — TADALAFIL 5 MG PO TABS: 5.0000 mg | ORAL_TABLET | Freq: Every day | ORAL | 3 refills | Status: DC

## 2023-11-10 ENCOUNTER — Encounter: Payer: Self-pay | Admitting: Family Medicine

## 2023-11-10 LAB — PSA: PSA: 1.26 ng/mL (ref 0.10–4.00)

## 2023-11-10 LAB — COMPREHENSIVE METABOLIC PANEL WITH GFR
ALT: 16 U/L (ref 0–53)
AST: 17 U/L (ref 0–37)
Albumin: 4.3 g/dL (ref 3.5–5.2)
Alkaline Phosphatase: 67 U/L (ref 39–117)
BUN: 15 mg/dL (ref 6–23)
CO2: 26 meq/L (ref 19–32)
Calcium: 9 mg/dL (ref 8.4–10.5)
Chloride: 104 meq/L (ref 96–112)
Creatinine, Ser: 1.09 mg/dL (ref 0.40–1.50)
GFR: 68.1 mL/min (ref >60.00)
Glucose, Bld: 90 mg/dL (ref 70–99)
Potassium: 4.3 meq/L (ref 3.5–5.1)
Sodium: 138 meq/L (ref 135–145)
Total Bilirubin: 0.5 mg/dL (ref 0.2–1.2)
Total Protein: 6.7 g/dL (ref 6.0–8.3)

## 2023-11-10 LAB — CBC
HCT: 44.5 % (ref 39.0–52.0)
Hemoglobin: 14.6 g/dL (ref 13.0–17.0)
MCHC: 32.8 g/dL (ref 30.0–36.0)
MCV: 82.8 fl (ref 78.0–100.0)
Platelets: 240 10*3/uL (ref 150.0–400.0)
RBC: 5.38 Mil/uL (ref 4.22–5.81)
RDW: 13.2 % (ref 11.5–15.5)
WBC: 5.7 10*3/uL (ref 4.0–10.5)

## 2023-11-10 LAB — LIPID PANEL
Cholesterol: 150 mg/dL (ref 0–200)
HDL: 56.7 mg/dL (ref >39.00)
LDL Cholesterol: 73 mg/dL (ref 0–99)
NonHDL: 93.74
Total CHOL/HDL Ratio: 3
Triglycerides: 105 mg/dL (ref 0.0–149.0)
VLDL: 21 mg/dL (ref 0.0–40.0)

## 2023-11-10 LAB — HEMOGLOBIN A1C: Hgb A1c MFr Bld: 5.5 % (ref 4.6–6.5)

## 2023-11-13 ENCOUNTER — Encounter: Payer: Self-pay | Admitting: Family Medicine

## 2023-12-05 ENCOUNTER — Other Ambulatory Visit: Payer: Self-pay | Admitting: Family Medicine

## 2023-12-05 DIAGNOSIS — N401 Enlarged prostate with lower urinary tract symptoms: Secondary | ICD-10-CM

## 2023-12-09 ENCOUNTER — Other Ambulatory Visit (HOSPITAL_COMMUNITY): Payer: Self-pay

## 2023-12-09 ENCOUNTER — Telehealth: Payer: Self-pay

## 2023-12-09 NOTE — Telephone Encounter (Signed)
 Current PA expires on 01/19/24. Submitted PA renewal via CMM.   Key: [id number]

## 2023-12-09 NOTE — Telephone Encounter (Signed)
 Pharmacy Patient Advocate Encounter   Received notification from Patient Advice Request messages that prior authorization for TADALAFIL  5MG  is required/requested.   Insurance verification completed.   The patient is insured through [employer] .   Per test claim: Refill too soon. PA is not needed at this time. Medication was filled 12/05/23. Next eligible fill date is 12/28/23. Current PA expires on 01/19/24.

## 2023-12-12 NOTE — Telephone Encounter (Signed)
 Pharmacy Patient Advocate Encounter  Received notification from [facility] that Prior Authorization renewal for Tadalafil  5mg  has been APPROVED from 12/09/23 to 06/08/24   PA #/Case ID/Reference #: [id number]

## 2023-12-23 DIAGNOSIS — N401 Enlarged prostate with lower urinary tract symptoms: Secondary | ICD-10-CM | POA: Diagnosis not present

## 2023-12-23 DIAGNOSIS — N138 Other obstructive and reflux uropathy: Secondary | ICD-10-CM | POA: Diagnosis not present

## 2023-12-23 DIAGNOSIS — Z87442 Personal history of urinary calculi: Secondary | ICD-10-CM | POA: Diagnosis not present

## 2023-12-23 DIAGNOSIS — Z87438 Personal history of other diseases of male genital organs: Secondary | ICD-10-CM | POA: Diagnosis not present

## 2023-12-23 DIAGNOSIS — Z125 Encounter for screening for malignant neoplasm of prostate: Secondary | ICD-10-CM | POA: Diagnosis not present

## 2024-01-03 ENCOUNTER — Ambulatory Visit: Admitting: Gastroenterology

## 2024-01-09 ENCOUNTER — Ambulatory Visit (INDEPENDENT_AMBULATORY_CARE_PROVIDER_SITE_OTHER): Admitting: Sports Medicine

## 2024-01-09 ENCOUNTER — Encounter: Payer: Self-pay | Admitting: Sports Medicine

## 2024-01-09 DIAGNOSIS — M25552 Pain in left hip: Secondary | ICD-10-CM

## 2024-01-09 DIAGNOSIS — M7711 Lateral epicondylitis, right elbow: Secondary | ICD-10-CM | POA: Diagnosis not present

## 2024-01-09 DIAGNOSIS — M51369 Other intervertebral disc degeneration, lumbar region without mention of lumbar back pain or lower extremity pain: Secondary | ICD-10-CM

## 2024-01-09 DIAGNOSIS — M160 Bilateral primary osteoarthritis of hip: Secondary | ICD-10-CM

## 2024-01-09 DIAGNOSIS — R29898 Other symptoms and signs involving the musculoskeletal system: Secondary | ICD-10-CM

## 2024-01-09 DIAGNOSIS — G8929 Other chronic pain: Secondary | ICD-10-CM | POA: Diagnosis not present

## 2024-01-09 MED ORDER — PREDNISONE 20 MG PO TABS: 20.0000 mg | ORAL_TABLET | Freq: Every day | ORAL | 0 refills | Status: AC

## 2024-01-09 NOTE — Progress Notes (Signed)
 Patient says that he has had left hip and right elbow pain for several months. He says that the hip does not bother him during the day or when he first goes to bed at night, but will wake up up in the middle of the night. He says that his pain is more over the posterior and lateral hip, and points to proximal hamstring, as well, when describing his pain. He says that it is never sharp. He does have the most trouble when laying on his back and feels he sometimes has spasms in this position. He denies any pain, numbness, or tingling down the leg. He has had back pain in the past, but says this feels like different pain. He has used Voltaren  gel with some relief.  Patient also mentions pain in the right arm. He points over the right elbow and forearm, He feels a stretch when bringing the wrist into flexion, and denies any discomfort with pronation and supination.

## 2024-01-09 NOTE — Progress Notes (Signed)
 Joshua [name] - 73 y.o. male MRN [id number]  Date of birth: May 11, 1951  Office Visit Note: Visit Date: 01/09/2024 PCP: Watt Harlene BROCKS, MD Referred by: Watt Harlene BROCKS, MD  Subjective: Chief Complaint  Patient presents with   Left Hip - Pain   Right Elbow - Pain   HPI: Joshua [name] is a pleasant 73 y.o. male who presents today for evaluation of chronic left hip/leg pain and right elbow pain.  Left hip -pain is more so over the lateral aspect of the hip.  This will radiate down the posterior leg at times as well but he denies any numbness or tingling.  He does not have much pain during the day, but does have discomfort that will wake him up at night when laying on the back.  At times he reports some spasming in the leg that will cause him to have to change position.  He denies any restless leg however.  He has used topical Voltaren  gel with some relief.  Right elbow -pain over the lateral aspect, pointing near the lateral epicondyle and common extensor tendon origin.  No specific injury.  He did have tennis elbow on the contralateral side and does have a counterforce strap and does have previous exercises that he was doing for the contralateral elbow.  He is not diabetic.  Lab Results  Component Value Date   HGBA1C 5.5 11/09/2023   Pertinent ROS were reviewed with the patient and found to be negative unless otherwise specified above in HPI.   Assessment & Plan: Visit Diagnoses:  1. Chronic left hip pain   2. Weakness of left hip   3. Bilateral primary osteoarthritis of hip   4. Lateral epicondylitis, right elbow   5. Degeneration of intervertebral disc of lumbar region, unspecified whether pain present    Plan: Impression is a few months of left posterior lateral hip pain.  He does have some weakness and pain with hip abduction testing with reciprocal guarding in the groin.  There is a degree of muscle weakness here over the left lateral hip and posterior chain compared to the  contralateral right side.  There is mild osteoarthritic change, but I do not believe his pain is emanating from within the hip joint.  We will get him started in formalized physical therapy to work on hip abduction strengthening and stabilization.  He is reporting some spasming/discomfort at nighttime only, although not reporting true restless leg syndrome.  We will see how he responds to PT.  Could consider gabapentin or further evaluation such as EMG/NCS or advanced imaging for the low back, although low suspicion at this time.  He does also have evidence of right elbow lateral epicondyle apathy.  There is no significant weakness, so less suspicious for high-grade tearing.  He has had this on the contralateral side, we will let him use his counterforce strap and perform his home exercises from previous PT for the other elbow.  For his inflammatory burden for both cases, he will start a low-dose of prednisone  20 mg x 7 days and then may discontinue.  Follow-up: Return in about 5 weeks (around 02/13/2024) for L-hip, R-eblow  Meds & Orders:  Meds ordered this encounter  Medications   predniSONE  (DELTASONE ) 20 MG tablet    Sig: Take 1 tablet (20 mg total) by mouth daily with breakfast for 7 days.    Dispense:  7 tablet    Refill:  0    Orders Placed This Encounter  Procedures  Ambulatory referral to Physical Therapy     Procedures: No procedures performed      Clinical History: No specialty comments available.  He reports that he quit smoking about 30 years ago. His smoking use included cigarettes. He started smoking about 52 years ago. He has a 11 pack-year smoking history. He has never used smokeless tobacco.  Recent Labs    11/09/23 1451  HGBA1C 5.5    Objective:   Physical Exam  Gen: Well-appearing, in no acute distress; non-toxic CV: Well-perfused. Warm.  Resp: Breathing unlabored on room air; no wheezing. Psych: Fluid speech in conversation; appropriate affect; normal thought  process  Ortho Exam - Left hip: Equivocal leg lengths.  There is generalized tenderness more so in the mid aspect of the gluteal tendons posterior to the greater trochanter.  No specific greater trochanteric TTP.  There is 4/5 strength with mild weakness secondary to pain with resisted hip abduction on the left compared to 5/5 strength of the contralateral side.  Negative FADIR and FABER testing.  - Right elbow: Mild TTP at the common extensor tendon origin.  Full range of motion about the elbow joint.  No redness swelling or effusion.  There is mild pain with resisted wrist extension, third finger extension, positive Maudsley's test.  Imaging:  DG Hip Unilat W OR W/O Pelvis 2-3 Views Left CLINICAL DATA:  Left hip pain at night (posterolateral) for several months.  EXAM: DG HIP (WITH OR WITHOUT PELVIS) 2-3V LEFT  COMPARISON:  None Available.  FINDINGS: Mildly decreased bone mineralization. The bilateral sacroiliac and bilateral femoroacetabular joint spaces are maintained. Mild bilateral superolateral acetabular degenerative osteophytosis. Mild pubic symphysis subchondral sclerosis and superior spurring. No acute fracture or dislocation. Vascular phleboliths overlie the pelvis.  IMPRESSION: Mild bilateral femoroacetabular osteoarthritis.  Electronically Signed   By: Tanda Lyons M.D.   On: 11/12/2023 18:23  Narrative & Impression  CLINICAL DATA:  Low back pain and left hip pain.   EXAM: LUMBAR SPINE - COMPLETE 4+ VIEW   COMPARISON:  December 24, 2021   FINDINGS: There is no evidence of lumbar spine fracture. Curvature of spine. Minimal anterior spurring at L2, L3, L4, L5 and lower thoracic spine. Moderate facet joint sclerosis is noted at L4-5 and L5-S1. Mild retrolisthesis of L3 on L4 unchanged. Mild narrow intervertebral space at L4-5.   IMPRESSION: Degenerative joint changes of lumbar spine.     Electronically Signed   By: Craig Farr M.D.   On: 11/09/2023  15:56     Past Medical/Family/Surgical/Social History: Medications & Allergies reviewed per EMR, new medications updated. Patient Active Problem List   Diagnosis Date Noted   Vitreous floater, bilateral 05/15/2019   Nuclear sclerotic cataract of both eyes 05/15/2019   Precordial chest pain 04/06/2018   Dyspnea on exertion 04/06/2018   Dyslipidemia 04/06/2018   BPH with urinary obstruction 03/31/2018   Essential hypertension 01/17/2018   Solitary lung nodule 09/07/2017   Sinusitis, chronic/Cough 07/22/2016   Chronic asthma, mild persistent, uncomplicated 06/30/2016   Upper airway cough syndrome 06/29/2016   Past Medical History:  Diagnosis Date   Arthritis    Asthma    BPH (benign prostatic hyperplasia)    GERD (gastroesophageal reflux disease)    HLD (hyperlipidemia)    HSV infection    Hypertension    Kidney stones    Family History  Problem Relation Age of Onset   Kidney Stones Mother    Alzheimer's disease Father    Prostate cancer Maternal Grandfather  Colon cancer Paternal Grandfather 24   Stomach cancer Neg Hx    Esophageal cancer Neg Hx    Pancreatic cancer Neg Hx    Crohn's disease Neg Hx    Rectal cancer Neg Hx    Past Surgical History:  Procedure Laterality Date   APPENDECTOMY     BREAST CYST EXCISION Left    benign   LITHOTRIPSY     TONSILLECTOMY     Social History   Occupational History   Occupation: Engineer, technical sales  Tobacco Use   Smoking status: Former    Current packs/day: 0.00    Average packs/day: 0.5 packs/day for 22.0 years (11.0 ttl pk-yrs)    Types: Cigarettes    Start date: 06/29/1971    Quit date: 06/28/1993    Years since quitting: 30.5   Smokeless tobacco: Never  Vaping Use   Vaping status: Never Used  Substance and Sexual Activity   Alcohol use: Yes    Comment: occasionally   Drug use: No   Sexual activity: Not Currently

## 2024-01-18 ENCOUNTER — Encounter: Payer: Self-pay | Admitting: Gastroenterology

## 2024-01-18 ENCOUNTER — Ambulatory Visit: Admitting: Gastroenterology

## 2024-01-18 VITALS — BP 126/70 | HR 89 | Ht 67.5 in | Wt 196.0 lb

## 2024-01-18 DIAGNOSIS — R140 Abdominal distension (gaseous): Secondary | ICD-10-CM

## 2024-01-18 DIAGNOSIS — Z79899 Other long term (current) drug therapy: Secondary | ICD-10-CM | POA: Diagnosis not present

## 2024-01-18 DIAGNOSIS — K219 Gastro-esophageal reflux disease without esophagitis: Secondary | ICD-10-CM | POA: Diagnosis not present

## 2024-01-18 DIAGNOSIS — R112 Nausea with vomiting, unspecified: Secondary | ICD-10-CM | POA: Diagnosis not present

## 2024-01-18 MED ORDER — FAMOTIDINE 20 MG PO TABS: 20.0000 mg | ORAL_TABLET | Freq: Every day | ORAL | Status: AC

## 2024-01-18 MED ORDER — ONDANSETRON 4 MG PO TBDP: 4.0000 mg | ORAL_TABLET | Freq: Three times a day (TID) | ORAL | 1 refills | Status: AC | PRN

## 2024-01-18 MED ORDER — METRONIDAZOLE 500 MG PO TABS: 500.0000 mg | ORAL_TABLET | Freq: Two times a day (BID) | ORAL | 0 refills | Status: AC

## 2024-01-18 NOTE — Progress Notes (Signed)
 HPI :  73 year old male here for follow-up visit for intestinal gas and bloating, GERD, symptoms of nausea and vomiting.  Recall of seeing him in the past a few times for this.  He has had significant intestinal gas and bloating that has bothered him previously.  He has had a prior EGD and colonoscopy.  Tested negative for celiac disease.  He does eat a lot of vegetables, have given him a low FODMAP diet which she has tried in the past and tries to avoid trigger foods.  More recently we gave him some thought enzyme supplementation and he states that really has helped to reduce some of his gas and bloating.  He had a few episodes a few months ago where he had significant gas and belching, which progressed and led to nausea and ultimately had 2 episodes of vomiting.  This occurred about 4 times over the course of a month, 2 of these episodes led to vomiting.  Outside of these episodes he has felt pretty well.  He takes Protonix  20 mg every morning, states that typically controls his symptoms pretty well, occasionally can have some nocturnal heartburn that can bother him.  He rarely will take an extra dose of Protonix .  He added some Pepto-Bismol and Tums around the episodes of nausea vomiting recently which she thinks helped.  Denies any sick contacts at the time.  He denies nausea baseline.  Denies any early satiety.  Again his most bothersome symptom on a routine basis is gas with belching, clearly has some food triggers but he has a very hard time avoiding them with his diet.  His last EGD was in 2019 without any concerning findings.  He denies history of diabetes.  He has mentioned in the past when he travels overseas to [place] his symptoms have resolved, he thinks the food here is triggering the symptoms.   Prior workup: EGD 06/24/2012 - mild esophagitis, normal exam otherwise Colonoscopy 01/24/2007 - normal exam     EGD - 11/16/2017 -  - A 1 cm hiatal hernia was present. - Multiple diminutive  white plaques were found in the upper third of the esophagus and in the middle third of the esophagus. Brushings for KOH prep were obtained in the entire esophagus. - The exam of the esophagus was otherwise normal. No evidence of Barrett's esophagus. - The entire examined stomach was normal. - Benign small lymphangiectasias were present in the examined duodenum. - The exam of the duodenum was otherwise normal.     Colonoscopy 11/16/2017 - The perianal and digital rectal examinations were normal. - A 4 mm polyp was found in the cecum. The polyp was sessile. The polyp was removed with a cold snare. Resection and retrieval were complete. - A 7 mm polyp was found in the ascending colon. The polyp was sessile. The polyp was removed with a cold snare. Resection and retrieval were complete. - Multiple medium-mouthed diverticula were found in the transverse colon and left colon. - Internal hemorrhoids were found during retroflexion. - The exam was otherwise without abnormality.   ESOPHAGEAL BRUSHING(SPECIMEN 1 OF 1 COLLECTED 11/16/17): - BENIGN REACTIVE/REPARATIVE CHANGES - FUNGAL ORGANISMS PRESENT CONSISTENT WITH CANDIDA SP.   Surgical [P], ascending, cecum, and ileocecal valve, polyp (3) - SESSILE SERRATED POLYP(S) WITHOUT CYTOLOGIC DYSPLASIA.   Repeat in 5 years   Colonoscopy 03/22/23: - The perianal and digital rectal examinations were normal. - Multiple small-mouthed diverticula were found in the left colon. - A single small angiodysplastic lesion was  found in the ascending colon. - A 5 to 6 mm polyp was found in the transverse colon. The polyp was flat. The polyp was removed with a cold snare. Resection and retrieval were complete. - A 3 mm polyp was found in the sigmoid colon. The polyp was sessile. The polyp was removed with a cold snare. Resection and retrieval were complete. - Internal hemorrhoids were found during retroflexion. - The exam was otherwise without abnormality.  FINAL  DIAGNOSIS        1. Surgical [P], colon, sigmoid, polyp (1) :       - SESSILE SERRATED POLYP WITHOUT DYSPLASIA        2. Surgical [P], colon, transverse :       -  HYPERPLASTIC POLYP.   Repeat in 5 years    Past Medical History:  Diagnosis Date   Arthritis    Asthma    BPH (benign prostatic hyperplasia)    GERD (gastroesophageal reflux disease)    HLD (hyperlipidemia)    HSV infection    Hypertension    Kidney stones      Past Surgical History:  Procedure Laterality Date   APPENDECTOMY     BREAST CYST EXCISION Left    benign   LITHOTRIPSY     TONSILLECTOMY     Family History  Problem Relation Age of Onset   Kidney Stones Mother    Alzheimer's disease Father    Prostate cancer Maternal Grandfather    Colon cancer Paternal Grandfather 38   Stomach cancer Neg Hx    Esophageal cancer Neg Hx    Pancreatic cancer Neg Hx    Crohn's disease Neg Hx    Rectal cancer Neg Hx    Social History   Tobacco Use   Smoking status: Former    Current packs/day: 0.00    Average packs/day: 0.5 packs/day for 22.0 years (11.0 ttl pk-yrs)    Types: Cigarettes    Start date: 06/29/1971    Quit date: 06/28/1993    Years since quitting: 30.5   Smokeless tobacco: Never  Vaping Use   Vaping status: Never Used  Substance Use Topics   Alcohol use: Yes    Comment: occasionally   Drug use: No   Current Outpatient Medications  Medication Sig Dispense Refill   acyclovir  ointment (ZOVIRAX ) 5 % Apply 1 Application topically every 3 (three) hours. Use for one week as needed for outbreak 30 g 3   albuterol  (VENTOLIN  HFA) 108 (90 Base) MCG/ACT inhaler INHALE 1 TO 2 PUFFS INTO THE LUNGS EVERY 6 HOURS AS NEEDED FOR WHEEZING OR SHORTNESS OF BREATH 8.5 g 2   alclomethasone (ACLOVATE) 0.05 % cream Apply 1 Application topically 2 (two) times daily. (Patient taking differently: Apply 1 Application topically as needed.)     alfuzosin  (UROXATRAL ) 10 MG 24 hr tablet Take 1 tablet (10 mg total) by mouth  daily. 90 tablet 3   atorvastatin  (LIPITOR) 10 MG tablet TAKE 1 TABLET BY MOUTH DAILY 90 tablet 3   budesonide -formoterol  (SYMBICORT ) 80-4.5 MCG/ACT inhaler Inhale 2 puffs into the lungs every 12 (twelve) hours. First thing in the am and then 12 hours later. 1 each 11   Cholecalciferol (D3 2000) 50 MCG (2000 UT) CAPS Take by mouth.     ipratropium (ATROVENT ) 0.06 % nasal spray Place 2 sprays into both nostrils 4 (four) times daily. 15 mL 12   losartan  (COZAAR ) 100 MG tablet Take 1 tablet (100 mg total) by mouth daily. 90 tablet 3  magnesium 30 MG tablet Take 30 mg by mouth 2 (two) times daily.     mupirocin ointment (BACTROBAN) 2 % Apply topically. Apply topically 3 (three) times a day. Apply a small amount to affected area in the nose twice daily for 14 days.     pantoprazole  (PROTONIX ) 20 MG tablet Take 30-60 min before first meal of the day 180 tablet 2   tadalafil  (CIALIS ) 5 MG tablet Take 1 tablet (5 mg total) by mouth daily. Use daily for BPH 90 tablet 3   triamcinolone  cream (KENALOG ) 0.1 % Apply 1 Application topically 2 (two) times daily. Use as needed for dermatitis on leg (Patient taking differently: Apply 1 Application topically 2 (two) times daily as needed. Use as needed for dermatitis on leg) 30 g 0   valACYclovir  (VALTREX ) 500 MG tablet Take 1 tablet (500 mg total) by mouth daily. 90 tablet 1   No current facility-administered medications for this visit.   No Known Allergies   Review of Systems: All systems reviewed and negative except where noted in HPI.   Lab Results  Component Value Date   WBC 5.7 11/09/2023   HGB 14.6 11/09/2023   HCT 44.5 11/09/2023   MCV 82.8 11/09/2023   PLT 240.0 11/09/2023    Lab Results  Component Value Date   NA 138 11/09/2023   CL 104 11/09/2023   K 4.3 11/09/2023   CO2 26 11/09/2023   BUN 15 11/09/2023   CREATININE 1.09 11/09/2023   GFR 68.10 11/09/2023   CALCIUM  9.0 11/09/2023   ALBUMIN 4.3 11/09/2023   GLUCOSE 90 11/09/2023     Lab Results  Component Value Date   ALT 16 11/09/2023   AST 17 11/09/2023   ALKPHOS 67 11/09/2023   BILITOT 0.5 11/09/2023     Physical Exam: BP 126/70   Pulse 89   Ht 5' 7.5 (1.715 m)   Wt 196 lb (88.9 kg)   BMI 30.24 kg/m  Constitutional: Pleasant,well-developed, male in no acute distress. Neurological: Alert and oriented to person place and time. Psychiatric: Normal mood and affect. Behavior is normal.   ASSESSMENT: 73 y.o. male here for assessment of the following  1. Bloating   2. Gastroesophageal reflux disease, unspecified whether esophagitis present   3. Long-term current use of proton pump inhibitor therapy   4. Nausea and vomiting, unspecified vomiting type    Bloating with intestinal gas, often postprandial, appears to have some trigger foods.  Has a hard time avoiding triggers due to his diet.  When traveling in the past his symptoms have resolved.  I had previously discussed doing breath testing or empiric trial of antibiotics.  We had discussed using Flagyl  in the past for a trial, as I think it would be hard to get him rifaximin approved.  He declined previously.  We discussed this again, I think reasonable to try this versus doing breath testing which would be more expensive for him.  He is agreeable to try Flagyl  500 mg twice daily for 10 days, recommend he avoid alcohol use while doing this.  Will see if that helps him.  He can continue to take Fodzyme PRN when he eats trigger foods as this definitely provides benefit for him.  In regards to his recent episodes of nausea and vomiting, over the past month he is really not had any of this.  He denies any early satiety in regards to symptoms more concerning for gastroparesis etc.  He had an EGD 6 years ago.  We discussed if he had persistent symptoms then would consider repeating that, he wants to hold off for now and see how he does with trials of medical therapy.  I think we can continue his Protonix  20 mg  daily.  If he is having nocturnal symptoms he can take Pepcid  20 mg nightly, or trial of increasing Protonix  to 20 mg twice daily.  We discussed long-term risks of PPI and he wants to minimize dosing of that if possible, would prefer to take Pepcid .  I will also give him some Zofran  to use as needed if he has nausea.  I discussed with him that if he has recurrent episodes of nausea or vomiting we may want to consider an EGD and consider gastric emptying study.  He would like to hold off on that for now and see how he does with medical therapy as outlined.  I asked him to touch base with me in the upcoming weeks and give me an update on how he is feeling.  He agrees   PLAN: - avoid trigger foods in hopes of minimizing gas and bloating - trial of flagyl  500mg  BID for 10 days - he agrees to take, avoid alcohol while taking this - continue FODZYME PRN - continue protonix  20mg  / day - add pepcid  20mg  at bedtime - will get OTC - Zofran  4mg  ODT every 8 hours PRN - discussed repeat EGD, he wants to hold off for now but contact me should things recur and can consider it. We also discussed possible GES but he denies early satiety, etc.  Can consider this pending his course - asked him to touch base in the next several weeks to update me, consider repeat EGD if needed  Marcey Naval, MD [facility]

## 2024-01-18 NOTE — Patient Instructions (Addendum)
 We have sent the following medications to your pharmacy for you to pick up at your convenience: Flagyl : Take twice a daily for 10 days (Avoid alcohol when taking) Zofran  4 mg ODT: Take every 8 hours as needed for nausea  Continue Protonix  20 mg   Please purchase the following medications over the counter and take as directed: Pepcid  20 mg: Take daily at bedtime  Continue FODZYMES.  Thank you for entrusting me with your care and for choosing [facility], Dr. Elspeth Naval    _______________________________________________________  If your blood pressure at your visit was 140/90 or greater, please contact your primary care physician to follow up on this.  _______________________________________________________  If you are age 72 or older, your body mass index should be between 23-30. Your Body mass index is 30.24 kg/m. If this is out of the aforementioned range listed, please consider follow up with your Primary Care Provider.  If you are age 62 or younger, your body mass index should be between 19-25. Your Body mass index is 30.24 kg/m. If this is out of the aformentioned range listed, please consider follow up with your Primary Care Provider.   ________________________________________________________  The [facility] providers would like to encourage you to use MYCHART to communicate with providers for non-urgent requests or questions.  Due to long hold times on the telephone, sending your provider a message by [facility] may be a faster and more efficient way to get a response.  Please allow 48 business hours for a response.  Please remember that this is for non-urgent requests.  _______________________________________________________  [facility] is using a team-based approach to care.  Your team is made up of your doctor and two to three APPS. Our APPS (Nurse Practitioners and Physician Assistants) work with your physician to ensure care continuity for you.  They are fully qualified to address your health concerns and develop a treatment plan. They communicate directly with your gastroenterologist to care for you. Seeing the Advanced Practice Practitioners on your physician's team can help you by facilitating care more promptly, often allowing for earlier appointments, access to diagnostic testing, procedures, and other specialty referrals.

## 2024-01-24 DIAGNOSIS — L249 Irritant contact dermatitis, unspecified cause: Secondary | ICD-10-CM | POA: Diagnosis not present

## 2024-01-28 ENCOUNTER — Other Ambulatory Visit: Payer: Self-pay | Admitting: Internal Medicine

## 2024-01-31 ENCOUNTER — Ambulatory Visit: Attending: Orthopedic Surgery

## 2024-01-31 ENCOUNTER — Other Ambulatory Visit: Payer: Self-pay

## 2024-01-31 DIAGNOSIS — R29898 Other symptoms and signs involving the musculoskeletal system: Secondary | ICD-10-CM | POA: Diagnosis not present

## 2024-01-31 DIAGNOSIS — M6281 Muscle weakness (generalized): Secondary | ICD-10-CM | POA: Diagnosis not present

## 2024-01-31 DIAGNOSIS — M5459 Other low back pain: Secondary | ICD-10-CM | POA: Diagnosis not present

## 2024-01-31 DIAGNOSIS — G8929 Other chronic pain: Secondary | ICD-10-CM | POA: Insufficient documentation

## 2024-01-31 DIAGNOSIS — R252 Cramp and spasm: Secondary | ICD-10-CM | POA: Insufficient documentation

## 2024-01-31 DIAGNOSIS — M25552 Pain in left hip: Secondary | ICD-10-CM | POA: Insufficient documentation

## 2024-01-31 NOTE — Therapy (Unsigned)
 OUTPATIENT PHYSICAL THERAPY LOWER EXTREMITY EVALUATION   Patient Name: Joshua [name] MRN: [id number] DOB:May 06, 1951, 73 y.o., male Today's Date: 02/01/2024  END OF SESSION:  PT End of Session - 01/31/24 1504     Visit Number 1    Date for PT Re-Evaluation 03/27/24    Progress Note Due on Visit 10    PT Start Time 1502    Activity Tolerance Patient tolerated treatment well    Behavior During Therapy [facility] for tasks assessed/performed          Past Medical History:  Diagnosis Date   Arthritis    Asthma    BPH (benign prostatic hyperplasia)    GERD (gastroesophageal reflux disease)    HLD (hyperlipidemia)    HSV infection    Hypertension    Kidney stones    Past Surgical History:  Procedure Laterality Date   APPENDECTOMY     BREAST CYST EXCISION Left    benign   LITHOTRIPSY     TONSILLECTOMY     Patient Active Problem List   Diagnosis Date Noted   Vitreous floater, bilateral 05/15/2019   Nuclear sclerotic cataract of both eyes 05/15/2019   Precordial chest pain 04/06/2018   Dyspnea on exertion 04/06/2018   Dyslipidemia 04/06/2018   BPH with urinary obstruction 03/31/2018   Essential hypertension 01/17/2018   Solitary lung nodule 09/07/2017   Sinusitis, chronic/Cough 07/22/2016   Chronic asthma, mild persistent, uncomplicated 06/30/2016   Upper airway cough syndrome 06/29/2016    PCP: Watt Raisin, MD  REFERRING PROVIDER: Burnetta Brunet, DO  REFERRING DIAG: Chronic left hip pain with hip musculature weakness   THERAPY DIAG:  Muscle weakness (generalized)  Pain in left hip  Rationale for Evaluation and Treatment: Rehabilitation  ONSET DATE: 6 months  SUBJECTIVE:   SUBJECTIVE STATEMENT: No known trauma, just L lateral hip pain, primarily interfering with sleep. 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 Yes:  NPRS scale: 0 to6 Pain location: L lat hip Pain description: primarily interferes with getting to sleep and staing asleep feels good in am Aggravating factors: sleep Relieving factors: steroids did not help  PRECAUTIONS: None  RED FLAGS: None   WEIGHT BEARING RESTRICTIONS: No  FALLS:  Has patient fallen in last 6 months? No  LIVING ENVIRONMENT: Lives with: lives with their spouse Lives in: House/apartment Stairs: No Has following equipment at home: None  OCCUPATION: works on Animator several hours   PLOF: Independent  PATIENT GOALS: get better sleep  NEXT MD VISIT: one months September 2025  OBJECTIVE:  Note: Objective measures were completed at Evaluation unless otherwise noted.  DIAGNOSTIC FINDINGS: Mildly decreased bone mineralization. The bilateral sacroiliac and bilateral femoroacetabular joint spaces are maintained. Mild bilateral superolateral acetabular degenerative osteophytosis. Mild pubic symphysis subchondral sclerosis and superior spurring. No acute fracture or dislocation. Vascular phleboliths overlie the pelvis.   IMPRESSION: Mild bilateral femoroacetabular osteoarthritis.     Electronically Signed   By: Tanda Lyons M.D.   On: 11/12/2023 18:23    PATIENT SURVEYS:  PSFS: THE PATIENT SPECIFIC FUNCTIONAL SCALE  Place score of 0-10 (0 = unable to perform activity and 10 = able to perform activity at the same level as before injury or problem)  Activity Date: 01/31/24    Getting to sleep  3    2.staying asleep 3    3.going down  a hill  6    4.      Total Score 12      Total Score = Sum of activity scores/number of activities  Minimally Detectable Change: 3 points (for single activity); 2 points (for average score)  [name] (nd). The Patient Specific Functional Scale . Retrieved from [email or web address]   COGNITION: Overall cognitive status: Within functional limits for  tasks assessed     SENSATION: WFL   MUSCLE LENGTH: Hamstrings: Right -20 deg; Left -20 deg Thomas test: Right  nadeg; Left na deg  POSTURE: flattened lumbar area, loss of lordosis  PALPATION: Some tenderness L TFL, L glut minimus  LOWER EXTREMITY ROM: wfl B hips    LOWER EXTREMITY MMT:  MMT Right eval Left eval  Hip flexion  4  Hip extension    Hip abduction  4  Hip adduction    Hip internal rotation    Hip external rotation  5  Knee flexion    Knee extension    Ankle dorsiflexion    Ankle plantarflexion    Ankle inversion    Ankle eversion     (Blank rows = wfl)  LOWER EXTREMITY SPECIAL TESTS:  na  FUNCTIONAL TESTS:  Single leg stance: 3 trials: R 10 sec, L 4 sec  Forward heel taps from 4 step, unable L, 7 reps R with UE support GAIT: Distance walked: in clinic, no specific deficits                                                                                                                                TREATMENT DATE: 01/31/24:  Evaluation, brief due to 15 min late Inst in seated hip abd with theraband and in seated Lat hip stretch on L , in tailor position  PATIENT EDUCATION:  Education details: POC, goals Person educated: Patient Education method: Medical illustrator Education comprehension: needs further education  HOME EXERCISE PROGRAM: Access Code: [email or web address] URL: [email or web address] Date: 01/31/2024 Prepared by: Shakim Faith  Exercises - Seated Hip Abduction with Pelvic Floor Contraction and Resistance Loop  - 2 x daily - 7 x weekly - 1 sets - 10 reps - Seated Piriformis Stretch  - 2 x daily - 7 x weekly - 1 sets - 6 reps - 10 hold  ASSESSMENT:  CLINICAL IMPRESSION: Patient is a 73 y.o. male who was evaluated today by physical therapy due to L lateral hip pain.  His findings on evaluation are consistent for weakness l lat hip. He responded well to initial strengthening instruction.  Should benefit from skilled PT  to address his deficits .   OBJECTIVE IMPAIRMENTS: decreased balance, decreased strength, and pain.   ACTIVITY LIMITATIONS: sleeping, stairs, and locomotion level  PARTICIPATION LIMITATIONS: yard work  PERSONAL FACTORS: Age, Behavior pattern, Fitness, and 1-2 comorbidities: chronic LBP, h/ o asthma are also affecting patient's functional outcome.   REHAB POTENTIAL: Good  CLINICAL DECISION MAKING: Evolving/moderate complexity  EVALUATION COMPLEXITY: Moderate   GOALS: Goals reviewed with patient? Yes  SHORT TERM GOALS: Target date: 2 weeks 02/14/24 I HEP Baseline: Goal status: INITIAL   LONG TERM GOALS: Target date: 8 week 03/27/24  PSFS improve from 12 to 3 Baseline:  Goal status: INITIAL  2.  Single leg stance 20 sec on L leg for symmetrical balance compared to R Baseline:  Goal status: INITIAL  3.  Strength L hip abd improve from 4- to 5 for improved muscle control for gait, sleep Baseline:  Goal status: INITIAL       PLAN:  PT FREQUENCY: 1x/week  PT DURATION: 8 weeks  PLANNED INTERVENTIONS: 97110-Therapeutic exercises, 97530- Therapeutic activity, [id number]- Neuromuscular re-education, 97535- Self Care, and [id number]- Manual therapy  PLAN FOR NEXT SESSION: reassess L lateral hip strength, add more closed chain strength, manual tecniques as needed   Avyan Livesay L Riggs Dineen, PT, DPT, OCS 02/01/2024, 5:36 PM

## 2024-02-07 ENCOUNTER — Other Ambulatory Visit: Payer: Self-pay | Admitting: Family Medicine

## 2024-02-07 DIAGNOSIS — N401 Enlarged prostate with lower urinary tract symptoms: Secondary | ICD-10-CM

## 2024-02-08 ENCOUNTER — Ambulatory Visit: Payer: Self-pay

## 2024-02-08 DIAGNOSIS — M5459 Other low back pain: Secondary | ICD-10-CM | POA: Diagnosis not present

## 2024-02-08 DIAGNOSIS — R252 Cramp and spasm: Secondary | ICD-10-CM

## 2024-02-08 DIAGNOSIS — M25552 Pain in left hip: Secondary | ICD-10-CM | POA: Diagnosis not present

## 2024-02-08 DIAGNOSIS — M6281 Muscle weakness (generalized): Secondary | ICD-10-CM | POA: Diagnosis not present

## 2024-02-08 DIAGNOSIS — G8929 Other chronic pain: Secondary | ICD-10-CM | POA: Diagnosis not present

## 2024-02-08 DIAGNOSIS — R29898 Other symptoms and signs involving the musculoskeletal system: Secondary | ICD-10-CM | POA: Diagnosis not present

## 2024-02-08 NOTE — Therapy (Signed)
 OUTPATIENT PHYSICAL THERAPY LOWER EXTREMITY TREATMENT   Patient Name: Joshua [name] MRN: [id number] DOB:03/30/1951, 73 y.o., male Today's Date: 02/08/2024  END OF SESSION:  PT End of Session - 02/08/24 1409     Visit Number 2    Date for PT Re-Evaluation 03/27/24    Progress Note Due on Visit 10    PT Start Time 1402    PT Stop Time 1446    PT Time Calculation (min) 44 min    Activity Tolerance Patient tolerated treatment well    Behavior During Therapy WFL for tasks assessed/performed           Past Medical History:  Diagnosis Date   Arthritis    Asthma    BPH (benign prostatic hyperplasia)    GERD (gastroesophageal reflux disease)    HLD (hyperlipidemia)    HSV infection    Hypertension    Kidney stones    Past Surgical History:  Procedure Laterality Date   APPENDECTOMY     BREAST CYST EXCISION Left    benign   LITHOTRIPSY     TONSILLECTOMY     Patient Active Problem List   Diagnosis Date Noted   Vitreous floater, bilateral 05/15/2019   Nuclear sclerotic cataract of both eyes 05/15/2019   Precordial chest pain 04/06/2018   Dyspnea on exertion 04/06/2018   Dyslipidemia 04/06/2018   BPH with urinary obstruction 03/31/2018   Essential hypertension 01/17/2018   Solitary lung nodule 09/07/2017   Sinusitis, chronic/Cough 07/22/2016   Chronic asthma, mild persistent, uncomplicated 06/30/2016   Upper airway cough syndrome 06/29/2016    PCP: Watt Raisin, MD  REFERRING PROVIDER: Burnetta Brunet, DO  REFERRING DIAG: Chronic left hip pain with hip musculature weakness   THERAPY DIAG:  Muscle weakness (generalized)  Pain in left hip  Other low back pain  Cramp and spasm  Rationale for Evaluation and Treatment: Rehabilitation  ONSET DATE: 6 months  SUBJECTIVE:   SUBJECTIVE STATEMENT: Pt reports some pain mostly at night that wakes him up  PERTINENT HISTORY: H/o chronic LBP, new /recent onset L hp pain, referred to outpatient PT by DO  PAIN:   Are you having pain? Yes: NPRS scale: 0 to6 Pain location: L lat hip Pain description: primarily interferes with getting to sleep and staing asleep feels good in am Aggravating factors: sleep Relieving factors: steroids did not help  PRECAUTIONS: None  RED FLAGS: None   WEIGHT BEARING RESTRICTIONS: No  FALLS:  Has patient fallen in last 6 months? No  LIVING ENVIRONMENT: Lives with: lives with their spouse Lives in: House/apartment Stairs: No Has following equipment at home: None  OCCUPATION: works on [employer] several hours   PLOF: Independent  PATIENT GOALS: get better sleep  NEXT MD VISIT: one months September 2025  OBJECTIVE:  Note: Objective measures were completed at Evaluation unless otherwise noted.  DIAGNOSTIC FINDINGS: Mildly decreased bone mineralization. The bilateral sacroiliac and bilateral femoroacetabular joint spaces are maintained. Mild bilateral superolateral acetabular degenerative osteophytosis. Mild pubic symphysis subchondral sclerosis and superior spurring. No acute fracture or dislocation. Vascular phleboliths overlie the pelvis.   IMPRESSION: Mild bilateral femoroacetabular osteoarthritis.     Electronically Signed   By: Tanda Lyons M.D.   On: 11/12/2023 18:23    PATIENT SURVEYS:  PSFS: THE PATIENT SPECIFIC FUNCTIONAL SCALE  Place score of 0-10 (0 = unable to perform activity and 10 = able to perform activity at the same level as before injury or problem)  Activity Date: 01/31/24    Getting  to sleep  3    2.staying asleep 3    3.going down a hill  6    4.      Total Score 12      Total Score = Sum of activity scores/number of activities  Minimally Detectable Change: 3 points (for single activity); 2 points (for average score)  [name] (nd). The Patient Specific Functional Scale . Retrieved from [email or web address]   COGNITION: Overall cognitive status:  Within functional limits for tasks assessed     SENSATION: WFL   MUSCLE LENGTH: Hamstrings: Right -20 deg; Left -20 deg Thomas test: Right  nadeg; Left na deg  POSTURE: flattened lumbar area, loss of lordosis  PALPATION: Some tenderness L TFL, L glut minimus  LOWER EXTREMITY ROM: wfl B hips    LOWER EXTREMITY MMT:  MMT Right eval Left eval  Hip flexion  4  Hip extension    Hip abduction  4  Hip adduction    Hip internal rotation    Hip external rotation  5  Knee flexion    Knee extension    Ankle dorsiflexion    Ankle plantarflexion    Ankle inversion    Ankle eversion     (Blank rows = wfl)  LOWER EXTREMITY SPECIAL TESTS:  na  FUNCTIONAL TESTS:  Single leg stance: 3 trials: R 10 sec, L 4 sec  Forward heel taps from 4 step, unable L, 7 reps R with UE support GAIT: Distance walked: in clinic, no specific deficits                                                                                                                                TREATMENT DATE:  02/08/24 Recumbent Bike L3x67min Prone hip extension x 10 BLE S/L L hip abduction 2x10 Supine hip ADD with ball 10x5 Supine RTB march + TrA x 10 B Bridges RTB 2x10 Standing abduction RTB at thighs x 10 BLE Standing extension RTB at thighs x 10 BLE  01/31/24:  Evaluation, brief due to 15 min late Inst in seated hip abd with theraband and in seated Lat hip stretch on L , in tailor position  PATIENT EDUCATION:  Education details: HEP update Person educated: Patient Education method: Explanation and Demonstration Education comprehension: needs further education  HOME EXERCISE PROGRAM: Access Code: [email or web address] URL: [email or web address] Date: 02/08/2024 Prepared by: Sol Gaskins  Exercises - Seated Hip Abduction with Pelvic Floor Contraction and Resistance Loop  - 2 x daily - 7 x weekly - 1 sets - 10 reps - Seated Piriformis Stretch  - 2 x daily - 7 x weekly - 1 sets - 6 reps - 10  hold - Bridge with Hip Abduction and Resistance  - 1 x daily - 7 x weekly - 3 sets - 10 reps - Standing Hip Abduction with Resistance at Thighs  - 1 x daily - 7 x weekly - 3 sets - 10 reps -  Hip Extension with Resistance Loop  - 1 x daily - 7 x weekly - 3 sets - 10 reps  ASSESSMENT:  CLINICAL IMPRESSION: Progressed with LE strengthening today and added more ex to HEP. Strength in lateral L hip is about the same as the evaluation. Some cues required with exercises to correct form and technique. Will continue to benefit from hip strengthening and stabilization.  OBJECTIVE IMPAIRMENTS: decreased balance, decreased strength, and pain.   ACTIVITY LIMITATIONS: sleeping, stairs, and locomotion level  PARTICIPATION LIMITATIONS: yard work  PERSONAL FACTORS: Age, Behavior pattern, Fitness, and 1-2 comorbidities: chronic LBP, h/ o asthma are also affecting patient's functional outcome.   REHAB POTENTIAL: Good  CLINICAL DECISION MAKING: Evolving/moderate complexity  EVALUATION COMPLEXITY: Moderate   GOALS: Goals reviewed with patient? Yes  SHORT TERM GOALS: Target date: 2 weeks 02/14/24 I HEP Baseline: Goal status: 02/08/24- compliant   LONG TERM GOALS: Target date: 8 week 03/27/24  PSFS improve from 12 to 3 Baseline:  Goal status: INITIAL  2.  Single leg stance 20 sec on L leg for symmetrical balance compared to R Baseline:  Goal status: INITIAL  3.  Strength L hip abd improve from 4- to 5 for improved muscle control for gait, sleep Baseline:  Goal status: INITIAL       PLAN:  PT FREQUENCY: 1x/week  PT DURATION: 8 weeks  PLANNED INTERVENTIONS: 97110-Therapeutic exercises, 97530- Therapeutic activity, [id number]- Neuromuscular re-education, 97535- Self Care, and 02859- Manual therapy  PLAN FOR NEXT SESSION: closed chain strength, manual tecniques as needed   Sol LITTIE Gaskins, PTA 02/08/2024, 2:49 PM

## 2024-02-14 ENCOUNTER — Ambulatory Visit

## 2024-02-14 DIAGNOSIS — G8929 Other chronic pain: Secondary | ICD-10-CM | POA: Diagnosis not present

## 2024-02-14 DIAGNOSIS — M6281 Muscle weakness (generalized): Secondary | ICD-10-CM | POA: Diagnosis not present

## 2024-02-14 DIAGNOSIS — M5459 Other low back pain: Secondary | ICD-10-CM | POA: Diagnosis not present

## 2024-02-14 DIAGNOSIS — R252 Cramp and spasm: Secondary | ICD-10-CM

## 2024-02-14 DIAGNOSIS — R29898 Other symptoms and signs involving the musculoskeletal system: Secondary | ICD-10-CM | POA: Diagnosis not present

## 2024-02-14 DIAGNOSIS — M25552 Pain in left hip: Secondary | ICD-10-CM | POA: Diagnosis not present

## 2024-02-14 NOTE — Therapy (Signed)
 OUTPATIENT PHYSICAL THERAPY LOWER EXTREMITY TREATMENT   Patient Name: Joshua [name] MRN: [id number] DOB:1950-12-01, 73 y.o., male Today's Date: 02/14/2024  END OF SESSION:  PT End of Session - 02/14/24 1449     Visit Number 3    Date for PT Re-Evaluation 03/27/24    Progress Note Due on Visit 10    PT Start Time 1449    PT Stop Time 1530    PT Time Calculation (min) 41 min    Activity Tolerance Patient tolerated treatment well    Behavior During Therapy WFL for tasks assessed/performed           Past Medical History:  Diagnosis Date   Arthritis    Asthma    BPH (benign prostatic hyperplasia)    GERD (gastroesophageal reflux disease)    HLD (hyperlipidemia)    HSV infection    Hypertension    Kidney stones    Past Surgical History:  Procedure Laterality Date   APPENDECTOMY     BREAST CYST EXCISION Left    benign   LITHOTRIPSY     TONSILLECTOMY     Patient Active Problem List   Diagnosis Date Noted   Vitreous floater, bilateral 05/15/2019   Nuclear sclerotic cataract of both eyes 05/15/2019   Precordial chest pain 04/06/2018   Dyspnea on exertion 04/06/2018   Dyslipidemia 04/06/2018   BPH with urinary obstruction 03/31/2018   Essential hypertension 01/17/2018   Solitary lung nodule 09/07/2017   Sinusitis, chronic/Cough 07/22/2016   Chronic asthma, mild persistent, uncomplicated 06/30/2016   Upper airway cough syndrome 06/29/2016    PCP: Watt Raisin, MD  REFERRING PROVIDER: Burnetta Brunet, DO  REFERRING DIAG: Chronic left hip pain with hip musculature weakness   THERAPY DIAG:  Muscle weakness (generalized)  Pain in left hip  Other low back pain  Cramp and spasm  Rationale for Evaluation and Treatment: Rehabilitation  ONSET DATE: 6 months  SUBJECTIVE:   SUBJECTIVE STATEMENT: Sometimes the pain is good sometimes it's worse, the more he moves the lass pain he has that night  PERTINENT HISTORY: H/o chronic LBP, new /recent onset L hp pain,  referred to outpatient PT by DO  PAIN:  Are you having pain? Yes: NPRS scale: 0 to6 Pain location: L lat hip Pain description: primarily interferes with getting to sleep and staing asleep feels good in am Aggravating factors: sleep Relieving factors: steroids did not help  PRECAUTIONS: None  RED FLAGS: None   WEIGHT BEARING RESTRICTIONS: No  FALLS:  Has patient fallen in last 6 months? No  LIVING ENVIRONMENT: Lives with: lives with their spouse Lives in: House/apartment Stairs: No Has following equipment at home: None  OCCUPATION: works on [employer] several hours   PLOF: Independent  PATIENT GOALS: get better sleep  NEXT MD VISIT: one months September 2025  OBJECTIVE:  Note: Objective measures were completed at Evaluation unless otherwise noted.  DIAGNOSTIC FINDINGS: Mildly decreased bone mineralization. The bilateral sacroiliac and bilateral femoroacetabular joint spaces are maintained. Mild bilateral superolateral acetabular degenerative osteophytosis. Mild pubic symphysis subchondral sclerosis and superior spurring. No acute fracture or dislocation. Vascular phleboliths overlie the pelvis.   IMPRESSION: Mild bilateral femoroacetabular osteoarthritis.     Electronically Signed   By: Tanda Lyons M.D.   On: 11/12/2023 18:23    PATIENT SURVEYS:  PSFS: THE PATIENT SPECIFIC FUNCTIONAL SCALE  Place score of 0-10 (0 = unable to perform activity and 10 = able to perform activity at the same level as before injury or problem)  Activity Date: 01/31/24    Getting to sleep  3    2.staying asleep 3    3.going down a hill  6    4.      Total Score 12      Total Score = Sum of activity scores/number of activities  Minimally Detectable Change: 3 points (for single activity); 2 points (for average score)  [name] (nd). The Patient Specific Functional Scale . Retrieved from [email or web address]    COGNITION: Overall cognitive status: Within functional limits for tasks assessed     SENSATION: WFL   MUSCLE LENGTH: Hamstrings: Right -20 deg; Left -20 deg Thomas test: Right  nadeg; Left na deg  POSTURE: flattened lumbar area, loss of lordosis  PALPATION: Some tenderness L TFL, L glut minimus  LOWER EXTREMITY ROM: wfl B hips    LOWER EXTREMITY MMT:  MMT Right eval Left eval  Hip flexion  4  Hip extension    Hip abduction  4  Hip adduction    Hip internal rotation    Hip external rotation  5  Knee flexion    Knee extension    Ankle dorsiflexion    Ankle plantarflexion    Ankle inversion    Ankle eversion     (Blank rows = wfl)  LOWER EXTREMITY SPECIAL TESTS:  na  FUNCTIONAL TESTS:  Single leg stance: 3 trials: R 10 sec, L 4 sec  Forward heel taps from 4 step, unable L, 7 reps R with UE support GAIT: Distance walked: in clinic, no specific deficits                                                                                                                                TREATMENT DATE:  02/14/24 Recumbent Bike L3x59min Seated blue TB hip abduction 2x10  Standing hip abduction RTB at thighs x 20 BLE Standing hip extension RTB at thighs x 20 BLE Standing hip flexion RTB at thighs x 20 BLE Mini squat 10x3 Standing marches on airex no UE support 2x5 Tandem stance on airex 2x30 B Clock balance fwd, lat, post x 5 standing on airex  02/08/24 Recumbent Bike L3x9min Prone hip extension x 10 BLE S/L L hip abduction 2x10 Supine hip ADD with ball 10x5 Supine RTB march + TrA x 10 B Bridges RTB 2x10 Standing abduction RTB at thighs x 10 BLE Standing extension RTB at thighs x 10 BLE  01/31/24:  Evaluation, brief due to 15 min late Inst in seated hip abd with theraband and in seated Lat hip stretch on L , in tailor position  PATIENT EDUCATION:  Education details: HEP update Person educated: Patient Education method: Explanation and  Demonstration Education comprehension: needs further education  HOME EXERCISE PROGRAM: Access Code: [email or web address] URL: [email or web address] Date: 02/14/2024 Prepared by: Yony Roulston  Exercises - Seated Hip Abduction with Pelvic Floor Contraction and Resistance Loop  - 2 x daily - 7 x weekly -  1 sets - 10 reps - Seated Piriformis Stretch  - 2 x daily - 7 x weekly - 1 sets - 6 reps - 10 hold - Bridge with Hip Abduction and Resistance  - 1 x daily - 7 x weekly - 3 sets - 10 reps - Standing Hip Abduction with Resistance at Thighs  - 1 x daily - 7 x weekly - 3 sets - 10 reps - Hip Extension with Resistance Loop  - 1 x daily - 7 x weekly - 3 sets - 10 reps - Mini Squat  - 1 x daily - 7 x weekly - 3 sets - 10 reps  ASSESSMENT:  CLINICAL IMPRESSION: Pt responded well to treatment. Continued progressing as tolerated. Weakness L>R with balance activities and strengthening. No increased pain from interventions. Continues to benefit from strengthening and balance interventions.  OBJECTIVE IMPAIRMENTS: decreased balance, decreased strength, and pain.   ACTIVITY LIMITATIONS: sleeping, stairs, and locomotion level  PARTICIPATION LIMITATIONS: yard work  PERSONAL FACTORS: Age, Behavior pattern, Fitness, and 1-2 comorbidities: chronic LBP, h/ o asthma are also affecting patient's functional outcome.   REHAB POTENTIAL: Good  CLINICAL DECISION MAKING: Evolving/moderate complexity  EVALUATION COMPLEXITY: Moderate   GOALS: Goals reviewed with patient? Yes  SHORT TERM GOALS: Target date: 2 weeks 02/14/24 I HEP Baseline: Goal status: 02/14/24 met   LONG TERM GOALS: Target date: 8 week 03/27/24  PSFS improve from 12 to 3 Baseline:  Goal status: INITIAL  2.  Single leg stance 20 sec on L leg for symmetrical balance compared to R Baseline:  Goal status: 02/14/24  3.  Strength L hip abd improve from 4- to 5 for improved muscle control for gait, sleep Baseline:  Goal status:  INITIAL       PLAN:  PT FREQUENCY: 1x/week  PT DURATION: 8 weeks  PLANNED INTERVENTIONS: 97110-Therapeutic exercises, 97530- Therapeutic activity, [id number]- Neuromuscular re-education, 97535- Self Care, and 02859- Manual therapy  PLAN FOR NEXT SESSION: closed chain strength, manual tecniques as needed   Sol LITTIE Gaskins, PTA 02/14/2024, 3:39 PM

## 2024-02-21 ENCOUNTER — Encounter: Admitting: Rehabilitation

## 2024-02-28 ENCOUNTER — Ambulatory Visit: Attending: Sports Medicine

## 2024-02-28 DIAGNOSIS — M25552 Pain in left hip: Secondary | ICD-10-CM | POA: Insufficient documentation

## 2024-02-28 DIAGNOSIS — M5459 Other low back pain: Secondary | ICD-10-CM | POA: Diagnosis not present

## 2024-02-28 DIAGNOSIS — M6281 Muscle weakness (generalized): Secondary | ICD-10-CM | POA: Insufficient documentation

## 2024-02-28 DIAGNOSIS — R252 Cramp and spasm: Secondary | ICD-10-CM | POA: Insufficient documentation

## 2024-02-28 NOTE — Therapy (Signed)
 OUTPATIENT PHYSICAL THERAPY LOWER EXTREMITY TREATMENT   Patient Name: Joshua [name] MRN: [id number] DOB:December 11, 1950, 73 y.o., male Today's Date: 02/28/2024  END OF SESSION:  PT End of Session - 02/28/24 1458     Visit Number 4    Date for PT Re-Evaluation 03/27/24    Progress Note Due on Visit 10    PT Start Time 1456   pt late   PT Stop Time 1533    PT Time Calculation (min) 37 min    Activity Tolerance Patient tolerated treatment well    Behavior During Therapy WFL for tasks assessed/performed           Past Medical History:  Diagnosis Date   Arthritis    Asthma    BPH (benign prostatic hyperplasia)    GERD (gastroesophageal reflux disease)    HLD (hyperlipidemia)    HSV infection    Hypertension    Kidney stones    Past Surgical History:  Procedure Laterality Date   APPENDECTOMY     BREAST CYST EXCISION Left    benign   LITHOTRIPSY     TONSILLECTOMY     Patient Active Problem List   Diagnosis Date Noted   Vitreous floater, bilateral 05/15/2019   Nuclear sclerotic cataract of both eyes 05/15/2019   Precordial chest pain 04/06/2018   Dyspnea on exertion 04/06/2018   Dyslipidemia 04/06/2018   BPH with urinary obstruction 03/31/2018   Essential hypertension 01/17/2018   Solitary lung nodule 09/07/2017   Sinusitis, chronic/Cough 07/22/2016   Chronic asthma, mild persistent, uncomplicated 06/30/2016   Upper airway cough syndrome 06/29/2016    PCP: Watt Raisin, MD  REFERRING PROVIDER: Burnetta Brunet, DO  REFERRING DIAG: Chronic left hip pain with hip musculature weakness   THERAPY DIAG:  Muscle weakness (generalized)  Pain in left hip  Other low back pain  Cramp and spasm  Rationale for Evaluation and Treatment: Rehabilitation  ONSET DATE: 6 months  SUBJECTIVE:   SUBJECTIVE STATEMENT: Pt denies pain, reports that cutting his grass he has discomfort  PERTINENT HISTORY: H/o chronic LBP, new /recent onset L hp pain, referred to outpatient PT  by DO  PAIN:  Are you having pain? Yes: NPRS scale: 0/10 Pain location: L lat hip Pain description: primarily interferes with getting to sleep and staing asleep feels good in am Aggravating factors: sleep Relieving factors: steroids did not help  PRECAUTIONS: None  RED FLAGS: None   WEIGHT BEARING RESTRICTIONS: No  FALLS:  Has patient fallen in last 6 months? No  LIVING ENVIRONMENT: Lives with: lives with their spouse Lives in: House/apartment Stairs: No Has following equipment at home: None  OCCUPATION: works on [employer] several hours   PLOF: Independent  PATIENT GOALS: get better sleep  NEXT MD VISIT: one months September 2025  OBJECTIVE:  Note: Objective measures were completed at Evaluation unless otherwise noted.  DIAGNOSTIC FINDINGS: Mildly decreased bone mineralization. The bilateral sacroiliac and bilateral femoroacetabular joint spaces are maintained. Mild bilateral superolateral acetabular degenerative osteophytosis. Mild pubic symphysis subchondral sclerosis and superior spurring. No acute fracture or dislocation. Vascular phleboliths overlie the pelvis.   IMPRESSION: Mild bilateral femoroacetabular osteoarthritis.     Electronically Signed   By: Tanda Lyons M.D.   On: 11/12/2023 18:23    PATIENT SURVEYS:  PSFS: THE PATIENT SPECIFIC FUNCTIONAL SCALE  Place score of 0-10 (0 = unable to perform activity and 10 = able to perform activity at the same level as before injury or problem)  Activity Date: 01/31/24  Getting to sleep  3    2.staying asleep 3    3.going down a hill  6    4.      Total Score 12      Total Score = Sum of activity scores/number of activities  Minimally Detectable Change: 3 points (for single activity); 2 points (for average score)  [name] (nd). The Patient Specific Functional Scale . Retrieved from [email or web address]   COGNITION: Overall  cognitive status: Within functional limits for tasks assessed     SENSATION: WFL   MUSCLE LENGTH: Hamstrings: Right -20 deg; Left -20 deg Thomas test: Right  nadeg; Left na deg  POSTURE: flattened lumbar area, loss of lordosis  PALPATION: Some tenderness L TFL, L glut minimus  LOWER EXTREMITY ROM: wfl B hips    LOWER EXTREMITY MMT:  MMT Right eval Left eval L 02/28/24  Hip flexion  4   Hip extension     Hip abduction  4 4- adductor pain  Hip adduction     Hip internal rotation     Hip external rotation  5   Knee flexion     Knee extension     Ankle dorsiflexion     Ankle plantarflexion     Ankle inversion     Ankle eversion      (Blank rows = wfl)  LOWER EXTREMITY SPECIAL TESTS:  na  FUNCTIONAL TESTS:  Single leg stance: 3 trials: R 10 sec, L 4 sec  Forward heel taps from 4 step, unable L, 7 reps R with UE support GAIT: Distance walked: in clinic, no specific deficits                                                                                                                                TREATMENT DATE:  02/28/24 Recumbent Bike L3x80min Seated hip adductor stretch hip hinge 2x30 L Leg press 20lb 2x10 BLE;LLE leg press 10lb x 20 Standing hip hike from airex to floor 2x10 3 way toe tap from airex pad RLE, standing on LLE 5x Cone taps from airex pad AL and AM RLE x 5 Lateral lunges x 10 L side  02/14/24 Recumbent Bike L3x56min Seated blue TB hip abduction 2x10  Standing hip abduction RTB at thighs x 20 BLE Standing hip extension RTB at thighs x 20 BLE Standing hip flexion RTB at thighs x 20 BLE Mini squat 10x3 Standing marches on airex no UE support 2x5 Tandem stance on airex 2x30 B Clock balance fwd, lat, post x 5 standing on airex  02/08/24 Recumbent Bike L3x68min Prone hip extension x 10 BLE S/L L hip abduction 2x10 Supine hip ADD with ball 10x5 Supine RTB march + TrA x 10 B Bridges RTB 2x10 Standing abduction RTB at thighs x 10  BLE Standing extension RTB at thighs x 10 BLE  01/31/24:  Evaluation, brief due to 15 min late Inst in seated hip abd with theraband  and in seated Lat hip stretch on L , in tailor position  PATIENT EDUCATION:  Education details: HEP update Person educated: Patient Education method: Medical illustrator Education comprehension: needs further education  HOME EXERCISE PROGRAM: Access [name]: [email or web address] URL: [email or web address] Date: 02/14/2024 Prepared by: Sol Gaskins  Exercises - Seated Hip Abduction with Pelvic Floor Contraction and Resistance Loop  - 2 x daily - 7 x weekly - 1 sets - 10 reps - Seated Piriformis Stretch  - 2 x daily - 7 x weekly - 1 sets - 6 reps - 10 hold - Bridge with Hip Abduction and Resistance  - 1 x daily - 7 x weekly - 3 sets - 10 reps - Standing Hip Abduction with Resistance at Thighs  - 1 x daily - 7 x weekly - 3 sets - 10 reps - Hip Extension with Resistance Loop  - 1 x daily - 7 x weekly - 3 sets - 10 reps - Mini Squat  - 1 x daily - 7 x weekly - 3 sets - 10 reps  ASSESSMENT:  CLINICAL IMPRESSION: Progressed LLE strengthening and balance activities to tolerance. Pt shows balance deficits in LLE, with decreased strength, although strength has improved from first visit. Focused on CKC on glute med strengthening with single leg stability exercises. He will see his doctor next week, for f/u.   OBJECTIVE IMPAIRMENTS: decreased balance, decreased strength, and pain.   ACTIVITY LIMITATIONS: sleeping, stairs, and locomotion level  PARTICIPATION LIMITATIONS: yard work  PERSONAL FACTORS: Age, Behavior pattern, Fitness, and 1-2 comorbidities: chronic LBP, h/ o asthma are also affecting patient's functional outcome.   REHAB POTENTIAL: Good  CLINICAL DECISION MAKING: Evolving/moderate complexity  EVALUATION COMPLEXITY: Moderate   GOALS: Goals reviewed with patient? Yes  SHORT TERM GOALS: Target date: 2 weeks 02/14/24 I  HEP Baseline: Goal status: 02/14/24 met   LONG TERM GOALS: Target date: 8 week 03/27/24  PSFS improve from 12 to 3 Baseline:  Goal status: INITIAL  2.  Single leg stance 20 sec on L leg for symmetrical balance compared to R Baseline:  Goal status: 02/14/24  3.  Strength L hip abd improve from 4- to 5 for improved muscle control for gait, sleep Baseline:  Goal status: INITIAL       PLAN:  PT FREQUENCY: 1x/week  PT DURATION: 8 weeks  PLANNED INTERVENTIONS: 97110-Therapeutic exercises, 97530- Therapeutic activity, [id number]- Neuromuscular re-education, 97535- Self Care, and 02859- Manual therapy  PLAN FOR NEXT SESSION: closed chain strength, single leg exercises manual tecniques as needed   Taija Mathias L Armen Waring, PTA 02/28/2024, 3:33 PM

## 2024-03-01 ENCOUNTER — Encounter: Payer: Self-pay | Admitting: Family Medicine

## 2024-03-01 DIAGNOSIS — H9193 Unspecified hearing loss, bilateral: Secondary | ICD-10-CM

## 2024-03-05 ENCOUNTER — Encounter: Payer: Self-pay | Admitting: Sports Medicine

## 2024-03-05 ENCOUNTER — Ambulatory Visit (INDEPENDENT_AMBULATORY_CARE_PROVIDER_SITE_OTHER): Admitting: Sports Medicine

## 2024-03-05 DIAGNOSIS — M7711 Lateral epicondylitis, right elbow: Secondary | ICD-10-CM

## 2024-03-05 DIAGNOSIS — G8929 Other chronic pain: Secondary | ICD-10-CM

## 2024-03-05 DIAGNOSIS — M160 Bilateral primary osteoarthritis of hip: Secondary | ICD-10-CM | POA: Diagnosis not present

## 2024-03-05 DIAGNOSIS — M25552 Pain in left hip: Secondary | ICD-10-CM

## 2024-03-05 NOTE — Progress Notes (Signed)
 Joshua [name] - 73 y.o. male MRN [id number]  Date of birth: [birth year]  Office Visit Note: Visit Date: 03/05/2024 PCP: Watt Harlene BROCKS, MD Referred by: Watt Harlene BROCKS, MD  Subjective: Chief Complaint  Patient presents with   Left Hip - Follow-up   Right Elbow - Follow-up   HPI: Joshua [name] is a pleasant 73 y.o. male who presents today for f/u of chronic left hip/leg pain and right elbow pain.   Left hip/leg -this is improved as well although more bothersome than his elbow.  Continues with pain over the lateral and posterior lateral hip.  Has been progressing through physical therapy and is making progress.  He no longer is having tingling or cramping sensation in the leg.  At times he will have pain over the adductor region as well with certain therapy.  Right elbow -this is doing much better.  Still has some mild discomfort over the lateral elbow but does not bother him for ADLs.  *Did review his physical therapy notes as well as previous imaging today during the visit.  Pertinent ROS were reviewed with the patient and found to be negative unless otherwise specified above in HPI.   Assessment & Plan: Visit Diagnoses:  1. Chronic left hip pain   2. Greater trochanteric pain syndrome of left lower extremity   3. Lateral epicondylitis, right elbow   4. Bilateral primary osteoarthritis of hip    Plan: Impression is chronic left posterior lateral hip pain which indicative of greater trochanteric pain syndrome with improving but still asymmetric weakness with hip abduction on the affected left compared to right hip.  He has progressed through physical therapy and I would like him to continue this.  We discussed other options such as extracorporeal shockwave therapy, corticosteroid injection, PRP injection.  He is interested on trialing extracorporeal shockwave therapy, offered trialing today but he would like to make a separate visit for this and appropriate follow-up.  His right  lateral epicondylitis is getting better, he will continue his counterforce strap only with lifting/activity.  He will continue his HEP and PT for this.  I discontinue all prednisone  going forward, okay for topical Voltaren  gel for both etiologies.  I will see him back over the next 1-2 weeks for repeat evaluation and trial of shockwave therapy for the left hip.  Follow-up: Return for make 2 appts about 1-week apart for L-lateral hip.   Meds & Orders: No orders of the defined types were placed in this encounter.  No orders of the defined types were placed in this encounter.    Procedures: No procedures performed      Clinical History: No specialty comments available.  He reports that he quit smoking about 30 years ago. His smoking use included cigarettes. He started smoking about 52 years ago. He has a 11 pack-year smoking history. He has never used smokeless tobacco.  Recent Labs    11/09/23 1451  HGBA1C 5.5    Objective:    Physical Exam  Gen: Well-appearing, in no acute distress; non-toxic CV: Well-perfused. Warm.  Resp: Breathing unlabored on room air; no wheezing. Psych: Fluid speech in conversation; appropriate affect; normal thought process  Ortho Exam - Right elbow: No significant TTP with palpation over the lateral epicondyle.  Full range of motion about the elbow.  Mild pain with Maudsley's test and resisted wrist extension although good strength.  - Left hip: + TTP over the posterior aspect of the greater trochanteric region near the gluteal insertion.  There is improved hip abduction strength compared to previous visit but still slightly diminished compared to contralateral hip abduction which is full and intact.  No restriction with internal or external rotation.  Imaging: No results found.  Past Medical/Family/Surgical/Social History: Medications & Allergies reviewed per EMR, new medications updated. Patient Active Problem List   Diagnosis Date Noted   Vitreous  floater, bilateral 05/15/2019   Nuclear sclerotic cataract of both eyes 05/15/2019   Precordial chest pain 04/06/2018   Dyspnea on exertion 04/06/2018   Dyslipidemia 04/06/2018   BPH with urinary obstruction 03/31/2018   Essential hypertension 01/17/2018   Solitary lung nodule 09/07/2017   Sinusitis, chronic/Cough 07/22/2016   Chronic asthma, mild persistent, uncomplicated 06/30/2016   Upper airway cough syndrome 06/29/2016   Past Medical History:  Diagnosis Date   Arthritis    Asthma    BPH (benign prostatic hyperplasia)    GERD (gastroesophageal reflux disease)    HLD (hyperlipidemia)    HSV infection    Hypertension    Kidney stones    Family History  Problem Relation Age of Onset   Kidney Stones Mother    Alzheimer's disease Father    Prostate cancer Maternal Grandfather    Colon cancer Paternal Grandfather 76   Stomach cancer Neg Hx    Esophageal cancer Neg Hx    Pancreatic cancer Neg Hx    Crohn's disease Neg Hx    Rectal cancer Neg Hx    Past Surgical History:  Procedure Laterality Date   APPENDECTOMY     BREAST CYST EXCISION Left    benign   LITHOTRIPSY     TONSILLECTOMY     Social History   Occupational History   Occupation: Engineer, technical sales  Tobacco Use   Smoking status: Former    Current packs/day: 0.00    Average packs/day: 0.5 packs/day for 22.0 years (11.0 ttl pk-yrs)    Types: Cigarettes    Start date: 06/29/1971    Quit date: 06/28/1993    Years since quitting: 30.7   Smokeless tobacco: Never  Vaping Use   Vaping status: Never Used  Substance and Sexual Activity   Alcohol use: Yes    Comment: occasionally   Drug use: No   Sexual activity: Not Currently   I spent 33 minutes in the care of the patient today including face-to-face time, preparation to see the patient, as well as review of previous imaging, gait analysis, discussion on conservative treatment options including extracorporeal shockwave therapy, PRP injection discussion on transition  from PT to HEP for the above diagnoses.   Lonell Sprang, DO Primary Care Sports Medicine Physician  [facility] - Orthopedics  This note was dictated using Dragon naturally speaking software and may contain errors in syntax, spelling, or content which have not been identified prior to signing this note.

## 2024-03-05 NOTE — Progress Notes (Signed)
 Patient says that his hip and elbow seem to be improving. He states that he will still have discomfort, but overall his pain is better. He is still in physical therapy twice a week, and says that he does his exercises at home sparingly. He tries to do them once a day, or once every other day. He is not taking medicine for pain.

## 2024-03-06 ENCOUNTER — Ambulatory Visit

## 2024-03-06 DIAGNOSIS — M25552 Pain in left hip: Secondary | ICD-10-CM | POA: Diagnosis not present

## 2024-03-06 DIAGNOSIS — R252 Cramp and spasm: Secondary | ICD-10-CM

## 2024-03-06 DIAGNOSIS — M5459 Other low back pain: Secondary | ICD-10-CM

## 2024-03-06 DIAGNOSIS — M6281 Muscle weakness (generalized): Secondary | ICD-10-CM

## 2024-03-06 NOTE — Therapy (Signed)
 OUTPATIENT PHYSICAL THERAPY LOWER EXTREMITY TREATMENT   Patient Name: Joshua [name] MRN: [id number] DOB:[birth year], 73 y.o., male Today's Date: 03/06/2024  END OF SESSION:  PT End of Session - 03/06/24 1528     Visit Number 5    Date for PT Re-Evaluation 03/27/24    Authorization Type [employer]    Progress Note Due on Visit 10    PT Start Time 1445    PT Stop Time 1528    PT Time Calculation (min) 43 min    Activity Tolerance Patient tolerated treatment well    Behavior During Therapy WFL for tasks assessed/performed            Past Medical History:  Diagnosis Date   Arthritis    Asthma    BPH (benign prostatic hyperplasia)    GERD (gastroesophageal reflux disease)    HLD (hyperlipidemia)    HSV infection    Hypertension    Kidney stones    Past Surgical History:  Procedure Laterality Date   APPENDECTOMY     BREAST CYST EXCISION Left    benign   LITHOTRIPSY     TONSILLECTOMY     Patient Active Problem List   Diagnosis Date Noted   Vitreous floater, bilateral 05/15/2019   Nuclear sclerotic cataract of both eyes 05/15/2019   Precordial chest pain 04/06/2018   Dyspnea on exertion 04/06/2018   Dyslipidemia 04/06/2018   BPH with urinary obstruction 03/31/2018   Essential hypertension 01/17/2018   Solitary lung nodule 09/07/2017   Sinusitis, chronic/Cough 07/22/2016   Chronic asthma, mild persistent, uncomplicated 06/30/2016   Upper airway cough syndrome 06/29/2016    PCP: Watt Raisin, MD  REFERRING PROVIDER: Burnetta Brunet, DO  REFERRING DIAG: Chronic left hip pain with hip musculature weakness   THERAPY DIAG:  Muscle weakness (generalized)  Pain in left hip  Other low back pain  Cramp and spasm  Rationale for Evaluation and Treatment: Rehabilitation  ONSET DATE: 6 months  SUBJECTIVE:   SUBJECTIVE STATEMENT: Now new complaints  PERTINENT HISTORY: H/o chronic LBP, new /recent onset L hp pain, referred to outpatient PT by DO  PAIN:   Are you having pain? Yes: NPRS scale: 0/10 Pain location: L lat hip Pain description: primarily interferes with getting to sleep and staing asleep feels good in am Aggravating factors: sleep Relieving factors: steroids did not help  PRECAUTIONS: None  RED FLAGS: None   WEIGHT BEARING RESTRICTIONS: No  FALLS:  Has patient fallen in last 6 months? No  LIVING ENVIRONMENT: Lives with: lives with their spouse Lives in: House/apartment Stairs: No Has following equipment at home: None  OCCUPATION: works on [employer] several hours   PLOF: Independent  PATIENT GOALS: get better sleep  NEXT MD VISIT: one months September 2025  OBJECTIVE:  Note: Objective measures were completed at Evaluation unless otherwise noted.  DIAGNOSTIC FINDINGS: Mildly decreased bone mineralization. The bilateral sacroiliac and bilateral femoroacetabular joint spaces are maintained. Mild bilateral superolateral acetabular degenerative osteophytosis. Mild pubic symphysis subchondral sclerosis and superior spurring. No acute fracture or dislocation. Vascular phleboliths overlie the pelvis.   IMPRESSION: Mild bilateral femoroacetabular osteoarthritis.     Electronically Signed   By: Tanda Lyons M.D.   On: 11/12/2023 18:23    PATIENT SURVEYS:  PSFS: THE PATIENT SPECIFIC FUNCTIONAL SCALE  Place score of 0-10 (0 = unable to perform activity and 10 = able to perform activity at the same level as before injury or problem)  Activity Date: 01/31/24    Getting to  sleep  3    2.staying asleep 3    3.going down a hill  6    4.      Total Score 12      Total Score = Sum of activity scores/number of activities  Minimally Detectable Change: 3 points (for single activity); 2 points (for average score)  [name] (nd). The Patient Specific Functional Scale . Retrieved from [email or web address]   COGNITION: Overall cognitive status:  Within functional limits for tasks assessed     SENSATION: WFL   MUSCLE LENGTH: Hamstrings: Right -20 deg; Left -20 deg Thomas test: Right  nadeg; Left na deg  POSTURE: flattened lumbar area, loss of lordosis  PALPATION: Some tenderness L TFL, L glut minimus  LOWER EXTREMITY ROM: wfl B hips    LOWER EXTREMITY MMT:  MMT Right eval Left eval L 02/28/24  Hip flexion  4   Hip extension     Hip abduction  4 4- adductor pain  Hip adduction     Hip internal rotation     Hip external rotation  5   Knee flexion     Knee extension     Ankle dorsiflexion     Ankle plantarflexion     Ankle inversion     Ankle eversion      (Blank rows = wfl)  LOWER EXTREMITY SPECIAL TESTS:  na  FUNCTIONAL TESTS:  Single leg stance: 3 trials: R 10 sec, L 4 sec  Forward heel taps from 4 step, unable L, 7 reps R with UE support GAIT: Distance walked: in clinic, no specific deficits                                                                                                                                TREATMENT DATE:  02/29/24 Recumbent Bike L4x9min S/L R/L hip ABD 2x10 2lb S/L R/L clamshell 2lb at thigh 2x10 Lateral lunges 2 x 10 L side Fwd luges 2x10 BLE Clock balance R/L 12 to 6 o'clock x 5 Leg press 25lb 2x15 BLE  02/28/24 Recumbent Bike L3x40min Seated hip adductor stretch hip hinge 2x30 L Leg press 20lb 2x10 BLE;LLE leg press 10lb x 20 Standing hip hike from airex to floor 2x10 3 way toe tap from airex pad RLE, standing on LLE 5x Cone taps from airex pad AL and AM RLE x 5 Lateral lunges x 10 L side  02/14/24 Recumbent Bike L3x6min Seated blue TB hip abduction 2x10  Standing hip abduction RTB at thighs x 20 BLE Standing hip extension RTB at thighs x 20 BLE Standing hip flexion RTB at thighs x 20 BLE Mini squat 10x3 Standing marches on airex no UE support 2x5 Tandem stance on airex 2x30 B Clock balance fwd, lat, post x 5 standing on airex  02/08/24 Recumbent Bike  L3x93min Prone hip extension x 10 BLE S/L L hip abduction 2x10 Supine hip ADD with ball 10x5 Supine RTB march +  TrA x 10 B Bridges RTB 2x10 Standing abduction RTB at thighs x 10 BLE Standing extension RTB at thighs x 10 BLE  01/31/24:  Evaluation, brief due to 15 min late Inst in seated hip abd with theraband and in seated Lat hip stretch on L , in tailor position  PATIENT EDUCATION:  Education details: HEP update Person educated: Patient Education method: Explanation and Demonstration Education comprehension: needs further education  HOME EXERCISE PROGRAM: Access Code: [email or web address] URL: [email or web address] Date: 03/06/2024 Prepared by: Sol Gaskins  Exercises - Seated Hip Abduction with Pelvic Floor Contraction and Resistance Loop  - 2 x daily - 7 x weekly - 1 sets - 10 reps - Seated Piriformis Stretch  - 2 x daily - 7 x weekly - 1 sets - 6 reps - 10 hold - Bridge with Hip Abduction and Resistance  - 1 x daily - 7 x weekly - 3 sets - 10 reps - Standing Hip Abduction with Resistance at Thighs  - 1 x daily - 7 x weekly - 3 sets - 10 reps - Hip Extension with Resistance Loop  - 1 x daily - 7 x weekly - 3 sets - 10 reps - Mini Squat  - 1 x daily - 7 x weekly - 3 sets - 10 reps - Side Lunge with Counter Support  - 1 x daily - 7 x weekly - 3 sets - 10 reps - Single Leg Balance with Clock Reach  - 1 x daily - 7 x weekly - 3 sets - 10 reps- 3 sets - 10 reps  ASSESSMENT:  CLINICAL IMPRESSION: Pt was able to complete all interventions today. Progressed balance interventions and strengthening for lateral hip musculature to improve function. LTG #2 is met today. Pt was a little tender over L greater trochanter area and notes his MD plans on trying some shockwave therapy to address pain.   OBJECTIVE IMPAIRMENTS: decreased balance, decreased strength, and pain.   ACTIVITY LIMITATIONS: sleeping, stairs, and locomotion level  PARTICIPATION LIMITATIONS: yard work  PERSONAL  FACTORS: Age, Behavior pattern, Fitness, and 1-2 comorbidities: chronic LBP, h/ o asthma are also affecting patient's functional outcome.   REHAB POTENTIAL: Good  CLINICAL DECISION MAKING: Evolving/moderate complexity  EVALUATION COMPLEXITY: Moderate   GOALS: Goals reviewed with patient? Yes  SHORT TERM GOALS: Target date: 2 weeks 02/14/24 I HEP Baseline: Goal status: 02/14/24 met   LONG TERM GOALS: Target date: 8 week 03/27/24  PSFS improve from 12 to 3 Baseline:  Goal status: INITIAL  2.  Single leg stance 20 sec on L leg for symmetrical balance compared to R Baseline:  Goal status: 03/06/24 met  3.  Strength L hip abd improve from 4- to 5 for improved muscle control for gait, sleep Baseline:  Goal status: 02/28/24       PLAN:  PT FREQUENCY: 1x/week  PT DURATION: 8 weeks  PLANNED INTERVENTIONS: 97110-Therapeutic exercises, 97530- Therapeutic activity, 97112- Neuromuscular re-education, 97535- Self Care, and 02859- Manual therapy  PLAN FOR NEXT SESSION: single leg balance activities; progress strengtheing   Sol LITTIE Gaskins, PTA 03/06/2024, 3:40 PM

## 2024-03-08 ENCOUNTER — Ambulatory Visit (INDEPENDENT_AMBULATORY_CARE_PROVIDER_SITE_OTHER): Admitting: Emergency Medicine

## 2024-03-08 ENCOUNTER — Ambulatory Visit: Payer: Self-pay

## 2024-03-08 ENCOUNTER — Encounter: Payer: Self-pay | Admitting: Emergency Medicine

## 2024-03-08 VITALS — BP 150/89 | HR 98 | Ht 66.0 in | Wt 195.2 lb

## 2024-03-08 DIAGNOSIS — J4530 Mild persistent asthma, uncomplicated: Secondary | ICD-10-CM | POA: Diagnosis not present

## 2024-03-08 DIAGNOSIS — R058 Other specified cough: Secondary | ICD-10-CM

## 2024-03-08 MED ORDER — PREDNISONE 20 MG PO TABS: 20.0000 mg | ORAL_TABLET | Freq: Every day | ORAL | 0 refills | Status: DC

## 2024-03-08 NOTE — Assessment & Plan Note (Signed)
 Continue his Symbicort , albuterol  as needed.

## 2024-03-08 NOTE — Progress Notes (Signed)
 Subjective:    Patient ID: Joshua [name], male    DOB: 09-19-1950, 73 y.o.   MRN: [id number]  HPI Acute office visit 03/08/2024 --  73 year old man followed by Dr. Darlean in our office for asthma.  Also with a history of GERD, rhinitis, hypertension, hyperlipidemia. He reports today currently managed on Symbicort , albuterol  which he uses rarely but more lately Atrovent  nasal spray, pantoprazole  20 mg daily He began to develop cough 2-3 day ago, associated with some wheeze and dyspnea. Using albuterol  more - few times a day. He started flonase  yesterday.   Pulmonary function testing 10/03/17 reviewed by me show moderate obstruction with a positive bronchodilator response normal diffusion capacity   Review of Systems As per HPI  Past Medical History:  Diagnosis Date   Arthritis    Asthma    BPH (benign prostatic hyperplasia)    GERD (gastroesophageal reflux disease)    HLD (hyperlipidemia)    HSV infection    Hypertension    Kidney stones      Family History  Problem Relation Age of Onset   Kidney Stones Mother    Alzheimer's disease Father    Prostate cancer Maternal Grandfather    Colon cancer Paternal Grandfather 92   Stomach cancer Neg Hx    Esophageal cancer Neg Hx    Pancreatic cancer Neg Hx    Crohn's disease Neg Hx    Rectal cancer Neg Hx      Social History   Socioeconomic History   Marital status: Married    Spouse name: Not on file   Number of children: 2   Years of education: Not on file   Highest education level: Master's degree (e.g., MA, MS, MEng, MEd, MSW, MBA)  Occupational History   Occupation: Engineer, technical sales  Tobacco Use   Smoking status: Former    Current packs/day: 0.00    Average packs/day: 0.5 packs/day for 22.0 years (11.0 ttl pk-yrs)    Types: Cigarettes    Start date: 06/29/1971    Quit date: 06/28/1993    Years since quitting: 30.7   Smokeless tobacco: Never  Vaping Use   Vaping status: Never Used  Substance and Sexual Activity    Alcohol use: Yes    Comment: occasionally   Drug use: No   Sexual activity: Not Currently  Other Topics Concern   Not on file  Social History Narrative   Not on file   Social Drivers of Health   Financial Resource Strain: Low Risk  (07/15/2023)   Overall Financial Resource Strain (CARDIA)    Difficulty of Paying Living Expenses: Not hard at all  Food Insecurity: No Food Insecurity (07/15/2023)   Hunger Vital Sign    Worried About Running Out of Food in the Last Year: Never true    Ran Out of Food in the Last Year: Never true  Transportation Needs: No Transportation Needs (07/15/2023)   PRAPARE - Administrator, Civil Service (Medical): No    Lack of Transportation (Non-Medical): No  Physical Activity: Insufficiently Active (07/15/2023)   Exercise Vital Sign    Days of Exercise per Week: 2 days    Minutes of Exercise per Session: 10 min  Stress: No Stress Concern Present (07/15/2023)   [employer] of Occupational Health - Occupational Stress Questionnaire    Feeling of Stress : Not at all  Social Connections: Moderately Integrated (07/15/2023)   Social Connection and Isolation Panel    Frequency of Communication with Friends and Family:  Three times a week    Frequency of Social Gatherings with Friends and Family: Once a week    Attends Religious Services: More than 4 times per year    Active Member of [employer] or Organizations: No    Attends Engineer, structural: Not on file    Marital Status: Married  Intimate Partner Violence: Unknown (07/14/2022)   Received from [facility]   HITS    Physically Hurt: Not on file    Insult or Talk Down To: Not on file    Threaten Physical Harm: Not on file    Scream or Curse: Not on file     No Known Allergies   Outpatient Medications Prior to Visit  Medication Sig Dispense Refill   acyclovir  ointment (ZOVIRAX ) 5 % Apply 1 Application topically every 3 (three) hours. Use for one week as needed for outbreak 30 g 3    albuterol  (VENTOLIN  HFA) 108 (90 Base) MCG/ACT inhaler INHALE 1 TO 2 PUFFS INTO THE LUNGS EVERY 6 HOURS AS NEEDED FOR WHEEZING OR SHORTNESS OF BREATH 8.5 g 2   alclomethasone (ACLOVATE) 0.05 % cream Apply 1 Application topically 2 (two) times daily. (Patient taking differently: Apply 1 Application topically as needed.)     alfuzosin  (UROXATRAL ) 10 MG 24 hr tablet Take 1 tablet (10 mg total) by mouth daily. 90 tablet 3   atorvastatin  (LIPITOR) 10 MG tablet TAKE 1 TABLET BY MOUTH DAILY 90 tablet 3   budesonide -formoterol  (SYMBICORT ) 80-4.5 MCG/ACT inhaler Inhale 2 puffs into the lungs every 12 (twelve) hours. First thing in the am and then 12 hours later. 1 each 11   Cholecalciferol (D3 2000) 50 MCG (2000 UT) CAPS Take by mouth.     desonide (DESOWEN) 0.05 % ointment Apply topically 2 (two) times daily.     famotidine  (PEPCID ) 20 MG tablet Take 1 tablet (20 mg total) by mouth at bedtime.     ipratropium (ATROVENT ) 0.06 % nasal spray Place 2 sprays into both nostrils 4 (four) times daily. 15 mL 12   losartan  (COZAAR ) 100 MG tablet Take 1 tablet (100 mg total) by mouth daily. 90 tablet 3   magnesium 30 MG tablet Take 30 mg by mouth 2 (two) times daily.     ondansetron  (ZOFRAN -ODT) 4 MG disintegrating tablet Take 1 tablet (4 mg total) by mouth every 8 (eight) hours as needed for nausea or vomiting. 30 tablet 1   pantoprazole  (PROTONIX ) 20 MG tablet TAKE 30-60 MINUTES BEFORE 1ST MEAL OF THE DAY 180 tablet 2   tadalafil  (CIALIS ) 5 MG tablet TAKE 1 TABLET BY MOUTH DAILY 90 tablet 3   valACYclovir  (VALTREX ) 500 MG tablet Take 1 tablet (500 mg total) by mouth daily. 90 tablet 1   mupirocin ointment (BACTROBAN) 2 % Apply topically. Apply topically 3 (three) times a day. Apply a small amount to affected area in the nose twice daily for 14 days. (Patient not taking: Reported on 03/08/2024)     triamcinolone  cream (KENALOG ) 0.1 % Apply 1 Application topically 2 (two) times daily. Use as needed for dermatitis on  leg (Patient not taking: Reported on 03/08/2024) 30 g 0   No facility-administered medications prior to visit.   This is    Objective:   Physical Exam Vitals:   03/08/24 1528  BP: (!) 150/89  Pulse: 98  SpO2: 97%  Weight: 195 lb 3.2 oz (88.5 kg)  Height: 5' 6 (1.676 m)    Gen: Pleasant, well-nourished, in no distress,  normal  affect  ENT: No lesions,  mouth clear,  oropharynx clear, no postnasal drip  Neck: No JVD, some mild upper airway noise  Lungs: No use of accessory muscles, coughs on a deep inspiration, no wheeze but some referred upper airway noise  Cardiovascular: RRR, heart sounds normal, no murmur or gallops, no peripheral edema  Musculoskeletal: No deformities, no cyanosis or clubbing  Neuro: alert, awake, non focal  Skin: Warm, no lesions or rash      Assessment & Plan:  Upper airway cough syndrome Flaring cough since the change in weather, increased allergy symptoms.  Some upper airway noise without any overt wheezing.  His asthma seems to be under control.  Needs to be back on an allergy regimen and he has already started fluticasone  nasal spray.  I will give him 5 days of prednisone , continue the allergy regimen and see how he improves.   Chronic asthma, mild persistent, uncomplicated Continue his Symbicort , albuterol  as needed.   Lamar Chris, MD, PhD 03/08/2024, 4:03 PM [facility] [phone] or if no answer before 7:00PM call [phone] For any issues after 7:00PM please call eLink [phone]

## 2024-03-08 NOTE — Telephone Encounter (Signed)
 Patient has an appointment on 03/08/24 at 3:30 pm.  Nothing further needed.

## 2024-03-08 NOTE — Assessment & Plan Note (Signed)
 Flaring cough since the change in weather, increased allergy symptoms.  Some upper airway noise without any overt wheezing.  His asthma seems to be under control.  Needs to be back on an allergy regimen and he has already started fluticasone  nasal spray.  I will give him 5 days of prednisone , continue the allergy regimen and see how he improves.

## 2024-03-08 NOTE — Patient Instructions (Addendum)
 Please take prednisone  as directed until completely gone Continue the fluticasone  nasal spray, 2 sprays each nostril once daily.  You should stay on this as a maintenance medication into the fall season Continue Symbicort  2 puffs twice a day.  Rinse and gargle after using. Keep your albuterol  available to use 2 puffs when needed for shortness of breath, chest tightness, wheezing. Please call our office if you are not improving so that we can see you back Follow with Dr. Darlean in about 2 months

## 2024-03-08 NOTE — Telephone Encounter (Signed)
 FYI Only or Action Required?: FYI only for provider.  Patient was last seen by Pulmonology on 08/18/23 with DR. Wert.  Called Nurse Triage reporting Cough.  Symptoms began several days ago.  Interventions attempted: OTC medications: Herbal cough syrup, and inhaler .  Symptoms are: unchanged.  Triage Disposition: See HCP Within 4 Hours (Or PCP Triage)  Patient/caregiver understands and will follow disposition?: Yes  **Appt. Scheduled at [facility] location for 9/11, as patient is seeking same day appt.                      Copied from CRM #[id number]. Topic: Clinical - Red Word Triage >> Mar 08, 2024  2:22 PM Leila BROCKS wrote: Red Word that prompted transfer to Nurse Triage: Patient [phone] states persistent cough for 4-5 days, getting worse, wheezing and runny nose. Patient states shortness of breath is being helped with inhaler. Patient denies no pain nor fever. Patient wants to see Dr. Darlean, please advise. Reason for Disposition  Wheezing is present  Answer Assessment - Initial Assessment Questions 1. ONSET: When did the cough begin?       X 4-5 days  2. SEVERITY: How bad is the cough today?      Constant   3. SPUTUM: Describe the color of your sputum (e.g., none, dry cough; clear, white, yellow, green)     Yellow  4. HEMOPTYSIS: Are you coughing up any blood? If Yes, ask: How much? (e.g., flecks, streaks, tablespoons, etc.)     No   5. DIFFICULTY BREATHING: Are you having difficulty breathing? If Yes, ask: How bad is it? (e.g., mild, moderate, severe)      Not currently, using inhaler   6. FEVER: Do you have a fever? If Yes, ask: What is your temperature, how was it measured, and when did it start?     No   7. CARDIAC HISTORY: Do you have any history of heart disease? (e.g., heart attack, congestive heart failure)      No   8. LUNG HISTORY: Do you have any history of lung disease?  (e.g., pulmonary embolus, asthma,  emphysema)     Asthma   9. PE RISK FACTORS: Do you have a history of blood clots? (or: recent major surgery, recent prolonged travel, bedridden)     No   10. OTHER SYMPTOMS: Do you have any other symptoms? (e.g., runny nose, wheezing, chest pain)  Wheezing, runny nose.   For home care patient is using inhaler, and OTC herbal cough syrup. No relief. Appt scheduled at [facility] office for 9/11, as he wishes to be seen today.  Protocols used: Cough - Acute Productive-A-AH

## 2024-03-12 ENCOUNTER — Ambulatory Visit: Admitting: Sports Medicine

## 2024-03-19 ENCOUNTER — Encounter: Payer: Self-pay | Admitting: Sports Medicine

## 2024-03-19 ENCOUNTER — Ambulatory Visit (INDEPENDENT_AMBULATORY_CARE_PROVIDER_SITE_OTHER): Admitting: Sports Medicine

## 2024-03-19 ENCOUNTER — Ambulatory Visit: Admitting: Sports Medicine

## 2024-03-19 DIAGNOSIS — G8929 Other chronic pain: Secondary | ICD-10-CM

## 2024-03-19 DIAGNOSIS — M25552 Pain in left hip: Secondary | ICD-10-CM

## 2024-03-19 NOTE — Progress Notes (Signed)
 Joshua [name] - 73 y.o. male MRN [id number]  Date of birth: 1950-09-01  Office Visit Note: Visit Date: 03/19/2024 PCP: Watt Harlene BROCKS, MD Referred by: Watt Harlene BROCKS, MD  Subjective: Chief Complaint  Patient presents with   Left Hip - Follow-up   HPI: Joshua [name] is a pleasant 73 y.o. male who presents today for follow-up of chronic left hip pain/GTPS.  Continues with some pain over the left lateral hip.  He is progressing through therapy and does feel he is making strength improvements.  He does not have much pain with walking or activity but does have pain with prolonged sitting such as riding in a car for greater than 1 hour.  Also some pain with laying on that side directly.  He is using topical Voltaren  gel only as needed.  Pertinent ROS were reviewed with the patient and found to be negative unless otherwise specified above in HPI.   Assessment & Plan: Visit Diagnoses:  1. Greater trochanteric pain syndrome of left lower extremity   2. Chronic left hip pain    Plan: Impression is greater trochanteric pain syndrome of the left without evidence of active bursitis, most indicative of gluteal tendinopathy which has been improving with physical therapy.  We did perform our first trial of extracorporeal shockwave therapy, patient tolerated well.  Discussed the overall pathophysiology of this treatment and augmentation of healing and blood flow recruitment.  We will proceed with an additional treatment over the next 1-2 weeks as he continues his PT and transition to HEP.  He will use topical Voltaren  gel and/or Tylenol  as needed.  Following this we will decide on future treatments depending on his improvement and response to ECSWT. F/u in 1-2 weeks for reevaluation and repeat treatment.  Meds & Orders: No orders of the defined types were placed in this encounter.  No orders of the defined types were placed in this encounter.    Procedures: Procedure: ECSWT Indications: Greater  trochanteric pain syndrome   Procedure Details Consent: Risks of procedure as well as the alternatives and risks of each were explained to the patient.  Verbal consent for procedure obtained. Time Out: Verified patient identification, verified procedure, site was marked, verified correct patient position. The area was cleaned with alcohol swab.     The left greater trochanteric region and gluteal tendons was targeted for Extracorporeal shockwave therapy.    Preset: GTPS Power Level: 110 mJ Frequency: 12 Hz  Impulse/cycles: 2400 Head size: Regular   Patient tolerated procedure well without immediate complications.       Clinical History: No specialty comments available.  He reports that he quit smoking about 30 years ago. His smoking use included cigarettes. He started smoking about 52 years ago. He has a 11 pack-year smoking history. He has never used smokeless tobacco.  Recent Labs    11/09/23 1451  HGBA1C 5.5    Objective:    Physical Exam  Gen: Well-appearing, in no acute distress; non-toxic CV: Well-perfused. Warm.  Resp: Breathing unlabored on room air; no wheezing. Psych: Fluid speech in conversation; appropriate affect; normal thought process  Ortho Exam - Left hip: + TTP directly over the greater trochanteric and the bursa itself, no appreciable bursitis noted.  No bony restriction with internal or external logroll.  Imaging:  DG Hip Unilat W OR W/O Pelvis 2-3 Views Left CLINICAL DATA:  Left hip pain at night (posterolateral) for several months.  EXAM: DG HIP (WITH OR WITHOUT PELVIS) 2-3V LEFT  COMPARISON:  None Available.  FINDINGS: Mildly decreased bone mineralization. The bilateral sacroiliac and bilateral femoroacetabular joint spaces are maintained. Mild bilateral superolateral acetabular degenerative osteophytosis. Mild pubic symphysis subchondral sclerosis and superior spurring. No acute fracture or dislocation. Vascular phleboliths overlie  the pelvis.  IMPRESSION: Mild bilateral femoroacetabular osteoarthritis.  Electronically Signed   By: Tanda Lyons M.D.   On: 11/12/2023 18:23   Past Medical/Family/Surgical/Social History: Medications & Allergies reviewed per EMR, new medications updated. Patient Active Problem List   Diagnosis Date Noted   Vitreous floater, bilateral 05/15/2019   Nuclear sclerotic cataract of both eyes 05/15/2019   Precordial chest pain 04/06/2018   Dyspnea on exertion 04/06/2018   Dyslipidemia 04/06/2018   BPH with urinary obstruction 03/31/2018   Essential hypertension 01/17/2018   Solitary lung nodule 09/07/2017   Sinusitis, chronic/Cough 07/22/2016   Chronic asthma, mild persistent, uncomplicated 06/30/2016   Upper airway cough syndrome 06/29/2016   Past Medical History:  Diagnosis Date   Arthritis    Asthma    BPH (benign prostatic hyperplasia)    GERD (gastroesophageal reflux disease)    HLD (hyperlipidemia)    HSV infection    Hypertension    Kidney stones    Family History  Problem Relation Age of Onset   Kidney Stones Mother    Alzheimer's disease Father    Prostate cancer Maternal Grandfather    Colon cancer Paternal Grandfather 79   Stomach cancer Neg Hx    Esophageal cancer Neg Hx    Pancreatic cancer Neg Hx    Crohn's disease Neg Hx    Rectal cancer Neg Hx    Past Surgical History:  Procedure Laterality Date   APPENDECTOMY     BREAST CYST EXCISION Left    benign   LITHOTRIPSY     TONSILLECTOMY     Social History   Occupational History   Occupation: Engineer, technical sales  Tobacco Use   Smoking status: Former    Current packs/day: 0.00    Average packs/day: 0.5 packs/day for 22.0 years (11.0 ttl pk-yrs)    Types: Cigarettes    Start date: 06/29/1971    Quit date: 06/28/1993    Years since quitting: 30.7   Smokeless tobacco: Never  Vaping Use   Vaping status: Never Used  Substance and Sexual Activity   Alcohol use: Yes    Comment: occasionally   Drug use:  No   Sexual activity: Not Currently

## 2024-03-20 ENCOUNTER — Ambulatory Visit

## 2024-03-27 ENCOUNTER — Ambulatory Visit: Admitting: Rehabilitation

## 2024-03-27 ENCOUNTER — Encounter: Payer: Self-pay | Admitting: Rehabilitation

## 2024-03-27 DIAGNOSIS — M5459 Other low back pain: Secondary | ICD-10-CM

## 2024-03-27 DIAGNOSIS — M6281 Muscle weakness (generalized): Secondary | ICD-10-CM | POA: Diagnosis not present

## 2024-03-27 DIAGNOSIS — R252 Cramp and spasm: Secondary | ICD-10-CM | POA: Diagnosis not present

## 2024-03-27 DIAGNOSIS — M25552 Pain in left hip: Secondary | ICD-10-CM | POA: Diagnosis not present

## 2024-03-27 NOTE — Therapy (Unsigned)
 OUTPATIENT PHYSICAL THERAPY LOWER EXTREMITY TREATMENT / RECERTIFICATION   Patient Name: Joshua [name] MRN: [id number] DOB:1951-01-09, 73 y.o., male Today's Date: 03/27/2024  END OF SESSION:  PT End of Session - 03/27/24 1452     Visit Number 6    Date for Recertification  03/27/24    Authorization Type [employer] MCR    Progress Note Due on Visit 10    PT Start Time 1450    PT Stop Time 1529    PT Time Calculation (min) 39 min    Activity Tolerance Patient tolerated treatment well    Behavior During Therapy WFL for tasks assessed/performed            Past Medical History:  Diagnosis Date   Arthritis    Asthma    BPH (benign prostatic hyperplasia)    GERD (gastroesophageal reflux disease)    HLD (hyperlipidemia)    HSV infection    Hypertension    Kidney stones    Past Surgical History:  Procedure Laterality Date   APPENDECTOMY     BREAST CYST EXCISION Left    benign   LITHOTRIPSY     TONSILLECTOMY     Patient Active Problem List   Diagnosis Date Noted   Vitreous floater, bilateral 05/15/2019   Nuclear sclerotic cataract of both eyes 05/15/2019   Precordial chest pain 04/06/2018   Dyspnea on exertion 04/06/2018   Dyslipidemia 04/06/2018   BPH with urinary obstruction 03/31/2018   Essential hypertension 01/17/2018   Solitary lung nodule 09/07/2017   Sinusitis, chronic/Cough 07/22/2016   Chronic asthma, mild persistent, uncomplicated 06/30/2016   Upper airway cough syndrome 06/29/2016    PCP: Watt Raisin, MD  REFERRING PROVIDER: Burnetta Brunet, DO  REFERRING DIAG: Chronic left hip pain with hip musculature weakness   THERAPY DIAG:  Muscle weakness (generalized)  Pain in left hip  Other low back pain  Rationale for Evaluation and Treatment: Rehabilitation  ONSET DATE: 6 months  SUBJECTIVE:   SUBJECTIVE STATEMENT: Patient reports he receives his first ESWT treatment and it seems to have helped a little.   He reports he is having a lot less  pain in the L hip now.  Rates pain today 1/10 and worst over the last 5 days is only 4/10 max.  He feels he is 60-70% improved overall.  He wants to continue PT if his MD thinks it is a good idea.  He will see Dr Burnetta again soon and make more appointments with us  afterwards.    PERTINENT HISTORY: H/o chronic LBP, new /recent onset L hip pain, referred to outpatient PT by DO  PAIN:  Are you having pain? Yes: NPRS scale: 0/10 Pain location: L lat hip Pain description: primarily interferes with getting to sleep and staing asleep feels good in am Aggravating factors: sleep Relieving factors: steroids did not help  PRECAUTIONS: None  RED FLAGS: None   WEIGHT BEARING RESTRICTIONS: No  FALLS:  Has patient fallen in last 6 months? No  LIVING ENVIRONMENT: Lives with: lives with their spouse Lives in: House/apartment Stairs: No Has following equipment at home: None  OCCUPATION: works on [employer] several hours   PLOF: Independent  PATIENT GOALS: get better sleep  NEXT MD VISIT: one months September 2025  OBJECTIVE:  Note: Objective measures were completed at Evaluation unless otherwise noted.  DIAGNOSTIC FINDINGS: Mildly decreased bone mineralization. The bilateral sacroiliac and bilateral femoroacetabular joint spaces are maintained. Mild bilateral superolateral acetabular degenerative osteophytosis. Mild pubic symphysis subchondral sclerosis and superior spurring.  No acute fracture or dislocation. Vascular phleboliths overlie the pelvis.   IMPRESSION: Mild bilateral femoroacetabular osteoarthritis.     Electronically Signed   By: Tanda Lyons M.D.   On: 11/12/2023 18:23    PATIENT SURVEYS:  PSFS: THE PATIENT SPECIFIC FUNCTIONAL SCALE  Place score of 0-10 (0 = unable to perform activity and 10 = able to perform activity at the same level as before injury or problem)  Activity Date: 01/31/24 03/26/24   Getting to sleep  3 1   2.staying asleep 3 3   3.going down a  hill  6 2   4.      Total Score 12 6     Total Score = Sum of activity scores/number of activities  Minimally Detectable Change: 3 points (for single activity); 2 points (for average score)  [name] (nd). The Patient Specific Functional Scale . Retrieved from [email or web address]   COGNITION: Overall cognitive status: Within functional limits for tasks assessed     SENSATION: WFL   MUSCLE LENGTH: Hamstrings: Right -20 deg; Left -20 deg Thomas test: Right  nadeg; Left na deg  POSTURE: flattened lumbar area, loss of lordosis  PALPATION: Some tenderness L TFL, L glut minimus  LOWER EXTREMITY ROM: wfl B hips    LOWER EXTREMITY MMT:  MMT Right eval Left eval L 02/28/24 LLE 03/27/24  Hip flexion  4  5  Hip extension      Hip abduction  4 4- adductor pain 4-  Hip adduction      Hip internal rotation    4  Hip external rotation  5  4+  Knee flexion    5  Knee extension    5  Ankle dorsiflexion   4+ 4+  Ankle plantarflexion   4 4  Ankle inversion      Ankle eversion       (Blank rows = wfl)  LOWER EXTREMITY SPECIAL TESTS:  na  FUNCTIONAL TESTS:  Single leg stance: 3 trials: R 10 sec, L 4 sec  Forward heel taps from 4 step, unable L, 7 reps R with UE support GAIT: Distance walked: in clinic, no specific deficits                                                                                                                                TREATMENT DATE:  03/27/24 NuStep L5 x 6' Tried Elliptical L0 x 2' but pt. C/o R quad pain (likely due to guarding)  THERAPEUTIC ACTIVITIES: To improve functional performance.  Demonstration, verbal and tactile cues throughout for technique. Rechecked strength Supine FABER piriformis stretch x 1' x 3 LLE SL hip abd x 3/10 LLE with manual facilitation for proper form SL clams blue TB x 2/10 LLE Supine bridge w/ blue TB at knees for hip ER x 2/10  BLE  NEUROMUSCULAR RE-EDUCATION: To improve balance, posture, proprioception, and reduce fall risk. SLS with 2 finger support  on Airex foam x 1' x 2 BLE Airex foam standing EC x 1'  SLS on floor w/ 1 finger support x 1' BLE  MANUAL THERAPY: To promote reduced pain utilizing myofascial release. MFR to L piriformis/posterior hip musculature  and also to L IT band in SL  02/29/24 Recumbent Bike L4x87min S/L R/L hip ABD 2x10 2lb S/L R/L clamshell 2lb at thigh 2x10 Lateral lunges 2 x 10 L side Fwd luges 2x10 BLE Clock balance R/L 12 to 6 o'clock x 5 Leg press 25lb 2x15 BLE  PATIENT EDUCATION:  Education details: [facility] app/HEP updated 9/30 Person educated: Patient Education method: Explanation and Demonstration Education comprehension: needs further education  HOME EXERCISE PROGRAM: Access Code: [email or web address] URL: [email or web address] Date: 03/27/2024 Prepared by: Garnette Montclair  Exercises - Seated Hip Abduction with Pelvic Floor Contraction and Resistance Loop  - 2 x daily - 7 x weekly - 1 sets - 10 reps - Seated Piriformis Stretch  - 2 x daily - 7 x weekly - 1 sets - 6 reps - 10 hold - Bridge with Hip Abduction and Resistance  - 1 x daily - 7 x weekly - 3 sets - 10 reps - Standing Hip Abduction with Resistance at Thighs  - 1 x daily - 7 x weekly - 3 sets - 10 reps - Hip Extension with Resistance Loop  - 1 x daily - 7 x weekly - 3 sets - 10 reps - Mini Squat  - 1 x daily - 7 x weekly - 3 sets - 10 reps - Side Lunge with Counter Support  - 1 x daily - 7 x weekly - 3 sets - 10 reps - Single Leg Balance with Clock Reach  - 1 x daily - 7 x weekly - 3 sets - 10 reps - Supine Figure 4 Piriformis Stretch  - 1 x daily - 7 x weekly - 1 sets - 2 reps - 1 minute hold - Sidelying Hip Abduction  - 1 x daily - 7 x weekly - 2 sets - 10 reps - Single Leg Stance  - 1 x daily - 7 x weekly - 3 sets - 10 reps ASSESSMENT:  CLINICAL IMPRESSION: Patient has been seen x 6 PT visits  over the last 2 months for chronic L hip pain and weakness.   He has made some progress to goals, and is not hurting nearly as bad as he was initially.  He feels about 60% improved.  His PSFS score has decreased from 12 to 6.  He still has hip abductor weakness that has failed to progress as we would like.   He still has rehab potential to deal with his residual L hip pain, weakness, balance, deficits.    He is agreeable to continue with PT, but wants to ask Dr Burnetta first before he makes more appointments.   He will f/u with Dr Burnetta soon per his report.   We will send recertificationon orders for another 1w4 and patient can call us  back for more appointments at his discretion.  He is agreeable to this plan.   OBJECTIVE IMPAIRMENTS: decreased balance, decreased strength, and pain.   ACTIVITY LIMITATIONS: sleeping, stairs, and locomotion level  PARTICIPATION LIMITATIONS: yard work  PERSONAL FACTORS: Age, Behavior pattern, Fitness, and 1-2 comorbidities: chronic LBP, h/ o asthma are also affecting patient's functional outcome.   REHAB POTENTIAL: Good  CLINICAL DECISION MAKING: Evolving/moderate complexity  EVALUATION COMPLEXITY: Moderate   GOALS: Goals reviewed with patient? Yes  SHORT TERM  GOALS: Target date: 2 weeks 04/14/24 PSFS improve from 12 to 3 Baseline: 12 03/27/24:  6 Goal status: IN PROGRESS   LONG TERM GOALS: Target date: 8 week 04/28/24   1.  Patient will report 0/10 L hip pain for improved function and QOL   Baseline:  1-3/10 on 9/30   Goal Status:  IN PROGRESS  2.  Strength L hip abd improve from 4- to 5 for improved muscle control for gait, sleep Baseline: 4- 03/27/24:  4-/5 Goal status: IN PROGRESS  PLAN:  PT FREQUENCY: 1x/week  PT DURATION: 8 weeks  PLANNED INTERVENTIONS: 97110-Therapeutic exercises, 97530- Therapeutic activity, 97112- Neuromuscular re-education, 97535- Self Care, and 02859- Manual therapy  PLAN FOR NEXT SESSION: Continue PT 1w4 for  further hip abductor strengthening; balance training.  New orders sent to continue PT today.  Patient will call to make appointments at his discretion.   Brissa Asante, PT 03/27/2024, 5:13 PM

## 2024-03-29 ENCOUNTER — Encounter: Payer: Self-pay | Admitting: Sports Medicine

## 2024-03-29 ENCOUNTER — Ambulatory Visit: Admitting: Sports Medicine

## 2024-03-29 DIAGNOSIS — G8929 Other chronic pain: Secondary | ICD-10-CM | POA: Diagnosis not present

## 2024-03-29 DIAGNOSIS — M25552 Pain in left hip: Secondary | ICD-10-CM

## 2024-03-29 NOTE — Progress Notes (Signed)
 Patient says that he has been feeling a bit better since the first shockwave treatment. He was sore for 2-3 days after the treatment, but that soreness improved gradually. He says that he has moved from pain to discomfort, and he does not seem to notice it when he is active and/or at the gym and doing his PT exercises. He does still notice this discomfort with activities such as mowing the lawn.

## 2024-03-29 NOTE — Progress Notes (Signed)
 Joshua [name] - 73 y.o. male MRN [id number]  Date of birth: 06-23-1951  Office Visit Note: Visit Date: 03/29/2024 PCP: Joshua Harlene BROCKS, MD Referred by: Joshua Harlene BROCKS, MD  Subjective: Chief Complaint  Patient presents with   Left Hip - Follow-up   HPI: Joshua [name] is a pleasant 73 y.o. male who presents today for f/u of chronic left hip pain/GTPS.  He did tolerate her first treatment of extracorporeal shockwave therapy, had some soreness for the first few days but after this he is started to notice improvement.  His pain is less discomforting although still present.  He does feel good when he goes to the gym and being active.  There is certain stretching that does aggravate his pain.  Pertinent ROS were reviewed with the patient and found to be negative unless otherwise specified above in HPI.   Assessment & Plan: Visit Diagnoses:  1. Greater trochanteric pain syndrome of left lower extremity   2. Chronic left hip pain    Plan: Impression is greater trochanteric pain syndrome of the left hip which has improved somewhat after our first treatment of extracorporeal shockwave therapy.  He has been consistent with his PT and home exercises as well.  We did discuss modifying some of the stretching based exercises which do reproduce his pain.  We did repeat ESWT today, patient tolerated well.  I would like him to progress with activity in the gym given this is beneficial for him.  I would like to see him back in the next 10-14 days to reevaluate, retest his strength and consider if he needs additional shockwave treatments.  Will continue Tylenol  and/or topical Voltaren  gel in the interim.  Additional treatment considerations: Greater trochanteric injection if not finding significant improvement with above  Follow-up: Return in about 11 days (around 04/09/2024) for f/u in 1.5 - 2 weeks for L-lateral hip f/u .   Meds & Orders: No orders of the defined types were placed in this  encounter.  No orders of the defined types were placed in this encounter.    Procedures: Procedure: ECSWT Indications: Greater trochanteric pain syndrome   Procedure Details Consent: Risks of procedure as well as the alternatives and risks of each were explained to the patient.  Verbal consent for procedure obtained. Time Out: Verified patient identification, verified procedure, site was marked, verified correct patient position. The area was cleaned with alcohol swab.     The left greater trochanteric region and gluteal tendons was targeted for Extracorporeal shockwave therapy.    Preset: GTPS Power Level: 110 mJ Frequency: 12 Hz  Impulse/cycles: 2750 Head size: Regular   Patient tolerated procedure well without immediate complications      Clinical History: No specialty comments available.  He reports that he quit smoking about 30 years ago. His smoking use included cigarettes. He started smoking about 52 years ago. He has a 11 pack-year smoking history. He has never used smokeless tobacco.  Recent Labs    11/09/23 1451  HGBA1C 5.5    Objective:    Physical Exam  Gen: Well-appearing, in no acute distress; non-toxic CV: Well-perfused. Warm.  Resp: Breathing unlabored on room air; no wheezing. Psych: Fluid speech in conversation; appropriate affect; normal thought process  Ortho Exam - Left hip: + TTP over greater trochanteric region without appreciable bursitis noted. No restriction with logroll testing.  Imaging: No results found.  Past Medical/Family/Surgical/Social History: Medications & Allergies reviewed per EMR, new medications updated. Patient Active Problem List  Diagnosis Date Noted   Vitreous floater, bilateral 05/15/2019   Nuclear sclerotic cataract of both eyes 05/15/2019   Precordial chest pain 04/06/2018   Dyspnea on exertion 04/06/2018   Dyslipidemia 04/06/2018   BPH with urinary obstruction 03/31/2018   Essential hypertension 01/17/2018    Solitary lung nodule 09/07/2017   Sinusitis, chronic/Cough 07/22/2016   Chronic asthma, mild persistent, uncomplicated 06/30/2016   Upper airway cough syndrome 06/29/2016   Past Medical History:  Diagnosis Date   Arthritis    Asthma    BPH (benign prostatic hyperplasia)    GERD (gastroesophageal reflux disease)    HLD (hyperlipidemia)    HSV infection    Hypertension    Kidney stones    Family History  Problem Relation Age of Onset   Kidney Stones Mother    Alzheimer's disease Father    Prostate cancer Maternal Grandfather    Colon cancer Paternal Grandfather 74   Stomach cancer Neg Hx    Esophageal cancer Neg Hx    Pancreatic cancer Neg Hx    Crohn's disease Neg Hx    Rectal cancer Neg Hx    Past Surgical History:  Procedure Laterality Date   APPENDECTOMY     BREAST CYST EXCISION Left    benign   LITHOTRIPSY     TONSILLECTOMY     Social History   Occupational History   Occupation: Engineer, technical sales  Tobacco Use   Smoking status: Former    Current packs/day: 0.00    Average packs/day: 0.5 packs/day for 22.0 years (11.0 ttl pk-yrs)    Types: Cigarettes    Start date: 06/29/1971    Quit date: 06/28/1993    Years since quitting: 30.7   Smokeless tobacco: Never  Vaping Use   Vaping status: Never Used  Substance and Sexual Activity   Alcohol use: Yes    Comment: occasionally   Drug use: No   Sexual activity: Not Currently

## 2024-04-02 ENCOUNTER — Other Ambulatory Visit: Payer: Self-pay | Admitting: Internal Medicine

## 2024-04-10 ENCOUNTER — Ambulatory Visit: Admitting: Sports Medicine

## 2024-04-10 DIAGNOSIS — L918 Other hypertrophic disorders of the skin: Secondary | ICD-10-CM | POA: Diagnosis not present

## 2024-04-10 DIAGNOSIS — D485 Neoplasm of uncertain behavior of skin: Secondary | ICD-10-CM | POA: Diagnosis not present

## 2024-04-11 ENCOUNTER — Ambulatory Visit: Attending: Family Medicine | Admitting: Audiologist

## 2024-04-11 DIAGNOSIS — H903 Sensorineural hearing loss, bilateral: Secondary | ICD-10-CM | POA: Insufficient documentation

## 2024-04-11 NOTE — Procedures (Signed)
  Outpatient Audiology and [facility] [address] [place], [place]  [address] [phone]  AUDIOLOGICAL  EVALUATION  NAME: Joshua [name]     DOB:   1950/09/13      MRN: [id number]                                                                                     DATE: 04/11/2024     REFERENT: Watt Harlene BROCKS, MD STATUS: Outpatient DIAGNOSIS: Sensorineural Hearing Loss    History: [place] was seen for an audiological evaluation due to a perceived change in hearing in the right ear. The right ear has gradually been changing for the last six months to a year. He has Resound RIC aids with semi open domes. He purchased his aids in [place] and returns there regularly to see family.  Jamaine denies pain, pressure, or tinnitus.  Zi has no history of hazardous noise exposure.  Medical history shows no additional risk for hearing loss.    Evaluation:  Otoscopy showed a clear view of the tympanic membranes, bilaterally Tympanometry results were consistent with normal middle ear function, bilaterally   Audiometric testing was completed using Conventional Audiometry techniques with insert earphones and supraural headphones. Test results are consistent with asymmetric sensorineural  hearing loss with right ear worse, see below. Speech Recognition Thresholds were obtained at 65dB HL in the right ear and at 50dB HL in the left ear. Word Recognition Testing was completed at  40dB SL and Kiyoshi scored 90% in the right ear and 100% in left.    Results:  The test results were reviewed with [place]. He is correct, his right ear has dropped significantly. His previous audiogram he brought from [place] has a symmetric hearing loss in 2022. The right ear shows a decrease in hearing 250-750 and 3-8kHz.  Audiogram printed and provided to [employer].         Recommendations: Hearing aids reprogramming recommended for both ears. Patient given list of local hearing aid providers. Or he can follow up when  visiting [place].  Annual audiometric testing recommended to monitor hearing loss for progression.  Recommend referral to Otolaryngology due to asymmetric loss in right ear with no explanation.    28 minutes spent testing and counseling on results.   If you have any questions please feel free to contact me at [phone].  Lauraine Ka Stalnaker Au.D.  Audiologist   04/11/2024  2:05 PM  Cc: Copland, Harlene BROCKS, MD

## 2024-04-19 ENCOUNTER — Ambulatory Visit (INDEPENDENT_AMBULATORY_CARE_PROVIDER_SITE_OTHER): Admitting: Sports Medicine

## 2024-04-19 ENCOUNTER — Encounter: Payer: Self-pay | Admitting: Sports Medicine

## 2024-04-19 DIAGNOSIS — G8929 Other chronic pain: Secondary | ICD-10-CM | POA: Diagnosis not present

## 2024-04-19 DIAGNOSIS — R252 Cramp and spasm: Secondary | ICD-10-CM | POA: Diagnosis not present

## 2024-04-19 DIAGNOSIS — M25552 Pain in left hip: Secondary | ICD-10-CM

## 2024-04-19 NOTE — Progress Notes (Signed)
 Patient says that he had a few days of soreness after the last treatment, but once that improved his hip has been feeling pretty good. He does still have some discomfort, but no pain that prevents him from doing the activities that he would like to do. He has not been back into the gym yet, but does want to try to find time to begin doing that again.  Patient mentions occasionally having cramps in his legs that wake him up at night. These cramps are either in the thigh or in the calf when he does have them, and leaves him sore for a couple of days after they occur.

## 2024-04-19 NOTE — Progress Notes (Signed)
 Joshua [name] - 73 y.o. male MRN [id number]  Date of birth: 04-08-1951  Office Visit Note: Visit Date: 04/19/2024 PCP: Watt Harlene BROCKS, MD Referred by: Watt Harlene BROCKS, MD  Subjective: Chief Complaint  Patient presents with   Left Hip - Follow-up   HPI: Joshua [name] is a pleasant 73 y.o. male who presents today for follow-up of left lateral hip pain, as well as new evaluation of b/l leg cramping.  Left hip - doing much better. Had a few days of soreness s/p this last ECSWT treatment but overall feels he is about 80% improved. No longer has pain at night and able to sleep on that side. Would like to become active in gym again.  Leg cramping - He has had a few episodes of this on and off for years in both legs. Mainly in calves but at times in upper legs, b/l. Recently had one in right calf that woke him up, but after a few seconds of stretching/massaging it resolved.   Pertinent ROS were reviewed with the patient and found to be negative unless otherwise specified above in HPI.   Assessment & Plan: Visit Diagnoses:  1. Greater trochanteric pain syndrome of left lower extremity   2. Chronic left hip pain   3. Leg cramping    Plan: Impression is improving GTPS of the left hip which has responded very well to 2 treatments of extracorporeal shockwave therapy and his PT/home exercises. Did repeat ECSWT today, tolerated well. Would like him to remain consistent with HEP as abduction strength improved but not yet 100%. In terms of his lower leg cramping, discussed multiple pathologies for this and evaluations. Does not seem to be blood flow/vascular related based on exam. I did write out on a piece of paper helpful tips for her to start with including but not limited to: - 5-10 minutes of stretching the legs before bed - maintain good hydration, including warm water with 1 spoon of salt before bed - supplements such as magnesium (glycinate or citrate) daily - compression  stockings/socks  He will implement these and I will have him f/u in 2 weeks. Will retest the hip and strength at that time and see if additional shockwave is needed.   Follow-up: Return in about 2 weeks (around 05/03/2024) for L-hip, cramping f/u.   Meds & Orders: No orders of the defined types were placed in this encounter.  No orders of the defined types were placed in this encounter.    Procedures: Procedure: ECSWT Indications: Greater trochanteric pain syndrome   Procedure Details Consent: Risks of procedure as well as the alternatives and risks of each were explained to the patient.  Verbal consent for procedure obtained. Time Out: Verified patient identification, verified procedure, site was marked, verified correct patient position. The area was cleaned with alcohol swab.     The left greater trochanteric region and gluteal tendons was targeted for Extracorporeal shockwave therapy.    Preset: GTPS Power Level: 100-110 mJ   Frequency: 12 Hz  Impulse/cycles: 2500 Head size: Regular   Patient tolerated procedure well without immediate complications       Clinical History: No specialty comments available.  He reports that he quit smoking about 30 years ago. His smoking use included cigarettes. He started smoking about 52 years ago. He has a 11 pack-year smoking history. He has never used smokeless tobacco.  Recent Labs    11/09/23 1451  HGBA1C 5.5    Objective:    Physical Exam  Gen: Well-appearing, in no acute distress; non-toxic CV: Well-perfused. Warm.  Resp: Breathing unlabored on room air; no wheezing. Psych: Fluid speech in conversation; appropriate affect; normal thought process  Ortho Exam - Left hip: Minimal TTP over greater trochanteric region without appreciable bursitis noted. No restriction with logroll testing. Improved hip abduction strength but not quite full and equivocal to other side.  - Legs: Calf muscles warm, good muscle bulk, no hypertonicity.  Cap refill < 2 secs   Imaging: No results found.  Past Medical/Family/Surgical/Social History: Medications & Allergies reviewed per EMR, new medications updated. Patient Active Problem List   Diagnosis Date Noted   Vitreous floater, bilateral 05/15/2019   Nuclear sclerotic cataract of both eyes 05/15/2019   Precordial chest pain 04/06/2018   Dyspnea on exertion 04/06/2018   Dyslipidemia 04/06/2018   BPH with urinary obstruction 03/31/2018   Essential hypertension 01/17/2018   Solitary lung nodule 09/07/2017   Sinusitis, chronic/Cough 07/22/2016   Chronic asthma, mild persistent, uncomplicated 06/30/2016   Upper airway cough syndrome 06/29/2016   Past Medical History:  Diagnosis Date   Arthritis    Asthma    BPH (benign prostatic hyperplasia)    GERD (gastroesophageal reflux disease)    HLD (hyperlipidemia)    HSV infection    Hypertension    Kidney stones    Family History  Problem Relation Age of Onset   Kidney Stones Mother    Alzheimer's disease Father    Prostate cancer Maternal Grandfather    Colon cancer Paternal Grandfather 72   Stomach cancer Neg Hx    Esophageal cancer Neg Hx    Pancreatic cancer Neg Hx    Crohn's disease Neg Hx    Rectal cancer Neg Hx    Past Surgical History:  Procedure Laterality Date   APPENDECTOMY     BREAST CYST EXCISION Left    benign   LITHOTRIPSY     TONSILLECTOMY     Social History   Occupational History   Occupation: Engineer, technical sales  Tobacco Use   Smoking status: Former    Current packs/day: 0.00    Average packs/day: 0.5 packs/day for 22.0 years (11.0 ttl pk-yrs)    Types: Cigarettes    Start date: 06/29/1971    Quit date: 06/28/1993    Years since quitting: 30.8   Smokeless tobacco: Never  Vaping Use   Vaping status: Never Used  Substance and Sexual Activity   Alcohol use: Yes    Comment: occasionally   Drug use: No   Sexual activity: Not Currently   I spent 30 minutes in the care of the patient today including  face-to-face time, preparation to see the patient, as well as review of previous imaging, home exercise guidance, conservative and pharmacologic options for cramping and for the above diagnoses.   Lonell Sprang, DO Primary Care Sports Medicine Physician  [facility] - Orthopedics  This note was dictated using Dragon naturally speaking software and may contain errors in syntax, spelling, or content which have not been identified prior to signing this note.

## 2024-04-21 ENCOUNTER — Other Ambulatory Visit: Payer: Self-pay | Admitting: Family Medicine

## 2024-04-21 DIAGNOSIS — A609 Anogenital herpesviral infection, unspecified: Secondary | ICD-10-CM

## 2024-04-30 ENCOUNTER — Encounter: Payer: Self-pay | Admitting: Radiology

## 2024-04-30 DIAGNOSIS — H903 Sensorineural hearing loss, bilateral: Secondary | ICD-10-CM | POA: Diagnosis not present

## 2024-05-01 ENCOUNTER — Other Ambulatory Visit (HOSPITAL_COMMUNITY): Payer: Self-pay | Admitting: Physician Assistant

## 2024-05-01 DIAGNOSIS — H903 Sensorineural hearing loss, bilateral: Secondary | ICD-10-CM

## 2024-05-03 ENCOUNTER — Encounter: Payer: Self-pay | Admitting: Sports Medicine

## 2024-05-03 ENCOUNTER — Ambulatory Visit: Admitting: Sports Medicine

## 2024-05-03 DIAGNOSIS — G8929 Other chronic pain: Secondary | ICD-10-CM | POA: Diagnosis not present

## 2024-05-03 DIAGNOSIS — M25552 Pain in left hip: Secondary | ICD-10-CM | POA: Diagnosis not present

## 2024-05-03 DIAGNOSIS — R252 Cramp and spasm: Secondary | ICD-10-CM

## 2024-05-03 NOTE — Progress Notes (Signed)
 Joshua [name] - 73 y.o. male MRN [id number]  Date of birth: 05/09/1951  Office Visit Note: Visit Date: 05/03/2024 PCP: Joshua Harlene BROCKS, MD Referred by: Joshua Harlene BROCKS, MD  Subjective: Chief Complaint  Patient presents with   Left Hip - Follow-up   HPI: Joshua [name] is a pleasant 73 y.o. male who presents today for follow-up of left lateral hip pain and b/l leg cramping.   The left hip pain is essentially resolved at this time.  He has no tenderness over the lateral hip.  He responded very well to a few treatments of extracorporeal shockwave therapy.  He is able to sleep on that side now.  He has been doing some stretches and exercises as well as incorporating working out at [employer] which has been helpful.   His leg cramping has significantly improved as well.  He has not had any episodes of leg cramping since our last visit.  He has been incorporating my recommendations including stretching before bed, hydration (+/- sodium concentrate).  Magnesium supplementation.  Pertinent ROS were reviewed with the patient and found to be negative unless otherwise specified above in HPI.   Assessment & Plan: Visit Diagnoses:  1. Greater trochanteric pain syndrome of left lower extremity   2. Leg cramping   3. Chronic left hip pain    Plan: Improving is essentially resolved greater trochanteric pain syndrome and left lateral hip pain.  He no longer has any pain, he did respond very well to extracorporeal shockwave therapy as well as PT and home exercises.  We did review his MedBridge exercises on his phone today, recommended alternating 4-5 exercises on a daily basis for maintenance and prevention.  In terms of his lower extremity cramping, he has not had any episodes of this since incorporating our conservative measures at last visit.  He will continue this with stretching before bed, increased hydration, sodium concentration, magnesium supplementation.  Also discussed tart cherry concentrate  as patient desires.  I would like him to continue performing an aerobic exercises at the [facility], as I do think improve muscle bulk will help with all of the above.  He will see me back only as needed.  Follow-up: Return if symptoms worsen or fail to improve.   Meds & Orders: No orders of the defined types were placed in this encounter.  No orders of the defined types were placed in this encounter.    Procedures: No procedures performed      Clinical History: No specialty comments available.  He reports that he quit smoking about 30 years ago. His smoking use included cigarettes. He started smoking about 52 years ago. He has a 11 pack-year smoking history. He has never used smokeless tobacco.  Recent Labs    11/09/23 1451  HGBA1C 5.5    Objective:    Physical Exam  Gen: Well-appearing, in no acute distress; non-toxic CV: Well-perfused. Warm.  Resp: Breathing unlabored on room air; no wheezing. Psych: Fluid speech in conversation; appropriate affect; normal thought process  Ortho Exam - Left hip: No tenderness over the greater trochanteric region.  There is fluid logroll.  There is 5/5 strength with resisted hip abduction which is equivocal to the contralateral side.  There is a mild degree of functional proximal muscle generalized weakness which has been improving with his workouts.  Imaging: No results found.  Past Medical/Family/Surgical/Social History: Medications & Allergies reviewed per EMR, new medications updated. Patient Active Problem List   Diagnosis Date Noted  Vitreous floater, bilateral 05/15/2019   Nuclear sclerotic cataract of both eyes 05/15/2019   Precordial chest pain 04/06/2018   Dyspnea on exertion 04/06/2018   Dyslipidemia 04/06/2018   BPH with urinary obstruction 03/31/2018   Essential hypertension 01/17/2018   Solitary lung nodule 09/07/2017   Sinusitis, chronic/Cough 07/22/2016   Chronic asthma, mild persistent, uncomplicated 06/30/2016   Upper  airway cough syndrome 06/29/2016   Past Medical History:  Diagnosis Date   Arthritis    Asthma    BPH (benign prostatic hyperplasia)    GERD (gastroesophageal reflux disease)    HLD (hyperlipidemia)    HSV infection    Hypertension    Kidney stones    Family History  Problem Relation Age of Onset   Kidney Stones Mother    Alzheimer's disease Father    Prostate cancer Maternal Grandfather    Colon cancer Paternal Grandfather 1   Stomach cancer Neg Hx    Esophageal cancer Neg Hx    Pancreatic cancer Neg Hx    Crohn's disease Neg Hx    Rectal cancer Neg Hx    Past Surgical History:  Procedure Laterality Date   APPENDECTOMY     BREAST CYST EXCISION Left    benign   LITHOTRIPSY     TONSILLECTOMY     Social History   Occupational History   Occupation: engineer, technical sales  Tobacco Use   Smoking status: Former    Current packs/day: 0.00    Average packs/day: 0.5 packs/day for 22.0 years (11.0 ttl pk-yrs)    Types: Cigarettes    Start date: 06/29/1971    Quit date: 06/28/1993    Years since quitting: 30.8   Smokeless tobacco: Never  Vaping Use   Vaping status: Never Used  Substance and Sexual Activity   Alcohol use: Yes    Comment: occasionally   Drug use: No   Sexual activity: Not Currently

## 2024-05-03 NOTE — Progress Notes (Signed)
 Patient says that his hip is feeling much better. He does not have pain day-to-day, and is not waking up with pain anymore. He also mentions that he has been stretching and has not had any leg cramps since his last visit.  Patient did bend down this morning and felt pain in his lower back. He denies any radicular symptoms today. He says that this happens occasionally, and typically he will take a muscle relaxer which helps with his symptoms within a couple of days. He has not taken a muscle relaxer today.

## 2024-05-09 ENCOUNTER — Encounter: Payer: Self-pay | Admitting: *Deleted

## 2024-05-22 ENCOUNTER — Ambulatory Visit (INDEPENDENT_AMBULATORY_CARE_PROVIDER_SITE_OTHER): Admitting: Internal Medicine

## 2024-05-22 ENCOUNTER — Encounter: Payer: Self-pay | Admitting: Internal Medicine

## 2024-05-22 VITALS — BP 126/74 | HR 67 | Ht 66.0 in | Wt 195.0 lb

## 2024-05-22 DIAGNOSIS — R058 Other specified cough: Secondary | ICD-10-CM

## 2024-05-22 DIAGNOSIS — J4530 Mild persistent asthma, uncomplicated: Secondary | ICD-10-CM

## 2024-05-22 MED ORDER — ALBUTEROL SULFATE HFA 108 (90 BASE) MCG/ACT IN AERS: INHALATION_SPRAY | RESPIRATORY_TRACT | 2 refills | Status: AC

## 2024-05-22 NOTE — Progress Notes (Unsigned)
 Subjective:    Patient ID: Joshua [name], male   DOB: 1951/06/27    MRN: [id number]    Brief patient profile:  73yo Joshua [name] male  with bad bronchitis onset  around 1993 and quit smoking and then  all better until around 2000 while living in [place] with intermittent wheezing dx as asthma > never resolved  Self referred for dtc asthma  Allergy testing in [place] around age 51 = neg per pt    History of Present Illness  06/29/2016 1st [facility] office visit/ Joshua [name]   Chief Complaint  Patient presents with   Pulmonary Consult    moved from [place] , chronic bronchitis, wheezing, dx with asthma in 2000, cold humidity makes it worse  maint on symb 160 2bid and at baseline intermittently noisy breathing one breath q 2-3 days  s pattern though not waking him  noct   Worse since  Jun 22 2016  with uri/ sinus symptoms >  [facility] rx  Amox/only a little better, still severe nasal congestion/severe fits of day > noct cough and subj wheeze.  Typical triggers are uri/not seasonal pattern rhintiis - happens up to 4 x yearly with or without symbicort  and just as severe. rec Augmentin  875 mg take one pill twice daily  X 10 days   Please see patient coordinator before you leave today  to schedule CT sinus  in 10 days - no sooner Use nasal saline as much as possible to help moisturize your passages  Continue protonix  but take it Take 30-60 min before first meal of the day  Prednisone  10 mg take  4 each am x 2 days,   2 each am x 2 days,  1 each am x 2 days and stop  Work on inhaler technique:  Plan A = Automatic = symbicort  80 Take 2 puffs first thing in am and then another 2 puffs about 12 hours later.  Plan B = Backup Only use your albuterol  (Proir) as a rescue medication to be used if you can't catch your breath by resting or doing a relaxed purse lip breathing pattern.  - The less you use it, the better it will work when you need it. - Ok to use the inhaler up to 2 puffs  every 4 hours      07/21/2016  f/u ov/Joshua [name] re: uacs vs asthma Chief Complaint  Patient presents with   Follow-up    Cough is much improved, but has not completely resolved. Breathing has improved.   rec Use lots of saline nasal spray on your nose Please remember to go to the x-ray department downstairs for your tests - we will call you with the results when they are available. Ty off the symbicort  to see if any worse and if so restart  At 2 pffs every 12 hour schedule ENT  >   [name] rec abx and f/u prn     11/17/2021  f/u ov/Joshua [name] re: uacs vs cough variant asthma maint on symbicort  80 2bid   Chief Complaint  Patient presents with   Follow-up    No complaints.  Rec Plan A = Automatic = Always=    symbicort  80 Take 2 puffs first thing in am and then another 2 puffs about 12 hours later.  Work on inhaler technique:   Plan B = Backup (to supplement plan A, not to replace it) Only use your albuterol  inhaler as a rescue medication      02/08/2023  f/u ov/Joshua [name]  re: asthma   maint on  wixella 100/50   Chief Complaint  Patient presents with   Follow-up    Side effect from  meds   Dyspnea:  walking 30 m s stopping sev times a weeks s sob or palpitions just tired  Cough: none  Sleeping: level bed with 2 pillows occ waking up with sob and palp sometimes better p saba, sometimes better s saba  SABA use: rarely dayime  02 none Rec Plan A = Automatic = Always=  Symbicort  80 Take 2 puffs first thing in am and then another 2 puffs about 12 hours later.  Plan B = Backup (to supplement plan A, not to replace it) Only use your albuterol  inhaler as a rescue medication     Please schedule a follow up visit in 3 months but call sooner if needed - bring inhalers   04/28/2023  f/u ov/Joshua [name] re: asthma    maint on wixella   did not  bring inhalers  Chief Complaint  Patient presents with   Follow-up    Insurance not paying for Symbicort .  Changed to [facility].  Causing throat dryness and hoarseness.  Dyspnea:   still walking up to 40 min including some hills  Cough: globus sensation /hoarseness  Sleeping: bed is flat/ 2 pillows once a week needs saba  SABA use: once a week  02: none   Rec Add pepcid  (otc) 20 mg before bedtime / bed blocks 6-8 in  Be sure to keep up your follow up with ENT   08/04/23   [place] one month history of nasal congestion. Nasal exam demonstrates evidence of dried blood on the left nasal ala sidewall. I recommend increasing nasal hygiene measures with more saline spray used throughout the day along with Mupirocin 2% ointment used twice daily for 14 days. I recommend avoidance of vigorous nose blowing, sneezing with an open mouth and use of a humidifier in the bedroom. We discussed use of Afrin decongestant nasal spray for an active nose bleed and pinching the nasal tip for 10 minutes. Handout provided. Once the nasal tissue is healed and bleeding episodes resolve, we may consider re-starting Flonase . I discussed proper and consistent use and explained that the spray takes a few weeks to begin seeing results. We will reach out to patient in a few weeks for an update on his symptoms. Follow up accordingly with ENT.     05/22/2024  f/u ov/Joshua [name] re: asthma with UACS component  maint on Symbicort   80  Chief Complaint  Patient presents with   Medical Management of Chronic Issues   Asthma    Had respiratory virus approx 2 months ago- tx by [facility] with pred taper. Cough is some better but occ will bother him first thing in the am or at night- non prod.   Dyspnea:  limited by L hip more than doe  Cough: better / nasal symptoms  Sleeping: flat bed 2 pillow s  resp cc  SABA use: once a week for noct wheeze  02: none   No obvious day to day or daytime variability or assoc excess/ purulent sputum or mucus plugs or hemoptysis or cp or chest tightness, subjective wheeze or overt sinus or hb symptoms.    Also denies any obvious fluctuation of symptoms with weather or environmental changes  or other aggravating or alleviating factors except as outlined above   No unusual exposure hx or h/o childhood pna/ asthma or knowledge of premature birth.  Current Allergies, Complete  Past Medical History, Past Surgical History, Family History, and Social History were reviewed in [employer] record.  ROS  The following are not active complaints unless bolded Hoarseness, sore throat, dysphagia, dental problems, itching, sneezing,  nasal congestion or discharge of excess mucus or purulent secretions, ear ache,   fever, chills, sweats, unintended wt loss or wt gain, classically pleuritic or exertional cp,  orthopnea pnd or arm/hand swelling  or leg swelling, presyncope, palpitations, abdominal pain, anorexia, nausea, vomiting, diarrhea  or change in bowel habits or change in bladder habits, change in stools or change in urine, dysuria, hematuria,  rash, arthralgias, visual complaints, headache, numbness, weakness or ataxia or problems with walking or coordination,  change in mood or  memory.        Current Meds  Medication Sig   acyclovir  ointment (ZOVIRAX ) 5 % Apply 1 Application topically every 3 (three) hours. Use for one week as needed for outbreak   albuterol  (VENTOLIN  HFA) 108 (90 Base) MCG/ACT inhaler INHALE 1 TO 2 PUFFS INTO THE LUNGS EVERY 6 HOURS AS NEEDED FOR WHEEZING OR SHORTNESS OF BREATH   alclomethasone (ACLOVATE) 0.05 % cream Apply 1 Application topically 2 (two) times daily.   alfuzosin  (UROXATRAL ) 10 MG 24 hr tablet Take 1 tablet (10 mg total) by mouth daily.   atorvastatin  (LIPITOR) 10 MG tablet TAKE 1 TABLET BY MOUTH DAILY   budesonide -formoterol  (SYMBICORT ) 80-4.5 MCG/ACT inhaler INHALE 2 PUFFS INTO THE LUNGS EVERY 12 HOURS FIRST THING IN THE MORNING AND THEN 12 HOURS LATER   Cholecalciferol (D3 2000) 50 MCG (2000 UT) CAPS Take by mouth.   desonide (DESOWEN) 0.05 % ointment Apply topically 2 (two) times daily.   famotidine  (PEPCID ) 20 MG tablet Take 1  tablet (20 mg total) by mouth at bedtime.   losartan  (COZAAR ) 100 MG tablet Take 1 tablet (100 mg total) by mouth daily.   magnesium 30 MG tablet Take 30 mg by mouth 2 (two) times daily.   mupirocin ointment (BACTROBAN) 2 % Apply topically. Apply topically 3 (three) times a day. Apply a small amount to affected area in the nose twice daily for 14 days.   ondansetron  (ZOFRAN -ODT) 4 MG disintegrating tablet Take 1 tablet (4 mg total) by mouth every 8 (eight) hours as needed for nausea or vomiting.   tadalafil  (CIALIS ) 5 MG tablet TAKE 1 TABLET BY MOUTH DAILY   triamcinolone  cream (KENALOG ) 0.1 % Apply 1 Application topically 2 (two) times daily. Use as needed for dermatitis on leg                      Objective:   Physical Exam  Wts  05/22/2024      ***  02/08/2023       198  11/09/2022       199  06/08/2022     196   11/17/2021       198  04/16/2021     195  07/28/2020       203 07/30/2019         203  01/23/2019       201  09/01/2018         205  07/18/2018       200     02/28/2018          203   10/03/2017         204   09/05/2017       204 07/21/16 196 lb (88.9 kg)  06/29/16 197 lb 12.8 oz (89.7 kg)    Vital signs reviewed  05/22/2024  - Note at rest 02 sats  ***% on ***   General appearance:    ***  Min barre***                    Assessment:

## 2024-05-22 NOTE — Patient Instructions (Signed)
 I emphasized that nasal steroids have no immediate benefit in terms of improving symptoms.  To help them reached the target tissue, the patient should use Afrin two puffs every 12 hours applied one min before using the nasal steroids.  Afrin should be stopped after no more than 5 days.  If the symptoms worsen, Afrin can be restarted after 5 days off of therapy to prevent rebound congestion from overuse of Afrin.  I also emphasized that in no way are nasal steroids a concern in terms of addiction.   Please schedule a follow up visit in 6  months but call sooner if needed

## 2024-05-24 NOTE — Assessment & Plan Note (Addendum)
 Onset 05/2016  - sinus CT p 10 day augmentin   06/29/2016 >>>  CT  Sinus 07/09/2016 mild chronic changes only  - cough better 07/21/2016 > try just on gerd rx/ no more symbicort  > flared off symbicort   eval by [place] ent 07/26/16  Nordbladh, PA > rec levaquin   500 x  10 days and f/u prn  - severe flare 07/18/2018 p uri/? Sinusitis > augmentin  /cyclical cough rx> 09/01/2018 improved x for hoarseness and cough with voice use  - severe flare 06/25/20 in setting of covid 19 infection  > cyclical cough rx 06/25/20 > improved 07/28/2020  - 08/18/2023 add back pecpid 20 mg q hs for noct sob requiring saba   Reviewwd approp rx with topical steroids both how and when to use them and remember to blow symbicort  out thru the nose to get additional benefit from ICS .

## 2024-05-24 NOTE — Assessment & Plan Note (Addendum)
 Onset around 2000 in [place] with allergy testing neg there 06/29/2016    reduce symbicort  to 80 2 bid  - FENO 07/21/2016  =   24 on symb 80 2bid - Spirometry 07/21/2016  wnl including  Mid flows p am symbicort  > try off > flared so restarted  - 09/05/2017    try symb increase to 160  - PFT's  10/03/2017  FEV1 2.57 (88 % ) ratio 67  p 21 % improvement from saba p symb 160 prior to study with DLCO  108 % corrects to 113 % for alv volume  With variable truncation of insp portion of f/v loop and reported better benefit from 80 than 160 strength c/w uacs / vcd component  - 05/31/2018 flare on symb 80 2bid  - 07/18/2018 added singulair  trial > d/c 01/23/2019  - 07/18/18 Spirometry   FEV1 2.6 (88%)  Ratio 0.80  - 09/01/2018  After extensive coaching inhaler device,  effectiveness =    75% (short Ti) rec try symb 80 just one bid to see if helps hoarseness or flares asthma > consider adding spacer next  - try off singulair  01/23/2019  - 11/09/2022  After extensive coaching inhaler device,  effectiveness =    80% (short ti) / 90% with wixella device > ok to try wixella but keep dose 100 one bid due to tendency to cough on higher doses of advair - 02/08/2023 changed back to symbicort  due to perceived side effects from wixella = palpitations / slt throat irritation   - 04/28/2023 changed wixella to advair 45 > symb 80 2bid with insurance restrictions - 05/22/2024  After extensive coaching inhaler device,  effectiveness =    80% with HFA    Despite active rhinitis,  on symbicort  80 2bid >  All goals of chronic asthma control met including optimal function and elimination of symptoms with minimal need for rescue therapy.  Contingencies discussed in full including contacting this office immediately if not controlling the symptoms using the rule of two's.     Discussed in detail all the  indications, usual  risks and alternatives  relative to the benefits with patient who agrees to proceed with Rx as outlined.              Each maintenance medication was reviewed in detail including emphasizing most importantly the difference between maintenance and prns and under what circumstances the prns are to be triggered using an action plan format where appropriate.  Total time for H and P, chart review, counseling, reviewing hfa/nasal device(s) and generating customized AVS unique to this office visit / same day charting = 30 min

## 2024-05-24 NOTE — Assessment & Plan Note (Signed)
 Onset around 2000 in [place] with allergy testing neg there 06/29/2016    reduce symbicort  to 80 2 bid  - FENO 07/21/2016  =   24 on symb 80 2bid - Spirometry 07/21/2016  wnl including  Mid flows p am symbicort  > try off > flared so restarted  - 09/05/2017    try symb increase to 160  - PFT's  10/03/2017  FEV1 2.57 (88 % ) ratio 67  p 21 % improvement from saba p symb 160 prior to study with DLCO  108 % corrects to 113 % for alv volume  With variable truncation of insp portion of f/v loop and reported better benefit from 80 than 160 strength c/w uacs / vcd component  - 05/31/2018 flare on symb 80 2bid  - 07/18/2018 added singulair  trial > d/c 01/23/2019  - 07/18/18 Spirometry   FEV1 2.6 (88%)  Ratio 0.80  - 09/01/2018  After extensive coaching inhaler device,  effectiveness =    75% (short Ti) rec try symb 80 just one bid to see if helps hoarseness or flares asthma > consider adding spacer next  - try off singulair  01/23/2019  - 11/09/2022  After extensive coaching inhaler device,  effectiveness =    80% (short ti) / 90% with wixella device > ok to try wixella but keep dose 100 one bid due to tendency to cough on higher doses of advair - 02/08/2023 changed back to symbicort  due to perceived side effects from wixella = palpitations / slt throat irritation   - 04/28/2023 changed wixella to advair 45 > symb 80 2bid with insurance restrictions    Except for occ noct symptoms which may be UACS from gerd,  all goals of chronic asthma control met including optimal function and elimination of symptoms with minimal need for rescue therapy.   Contingencies discussed in full including contacting this office immediately if not controlling the symptoms using the rule of two's.      Needs f/u just prior to the fall season to work out better contingencies  eg either use air supra prn or double the symbiocort ICS component       Each maintenance medication was reviewed in detail including emphasizing most importantly the  difference between maintenance and prns and under what circumstances the prns are to be triggered using an action plan format where appropriate.   Total time for H and P, chart review, counseling, reviewing hfa  device(s) and generating customized AVS unique to this office visit / same day charting = 23 min

## 2024-08-09 ENCOUNTER — Ambulatory Visit: Admitting: Physician Assistant
